# Patient Record
Sex: Female | Born: 1985 | Race: White | Hispanic: No | Marital: Married | Smoking: Never smoker
Health system: Southern US, Community
[De-identification: ages and names within clinical notes are randomized; demographics above are authoritative.]

## PROBLEM LIST (undated history)

## (undated) DIAGNOSIS — Z8711 Personal history of peptic ulcer disease: Secondary | ICD-10-CM

## (undated) DIAGNOSIS — E063 Autoimmune thyroiditis: Secondary | ICD-10-CM

## (undated) DIAGNOSIS — E119 Type 2 diabetes mellitus without complications: Secondary | ICD-10-CM

## (undated) DIAGNOSIS — K219 Gastro-esophageal reflux disease without esophagitis: Secondary | ICD-10-CM

## (undated) DIAGNOSIS — K7460 Unspecified cirrhosis of liver: Secondary | ICD-10-CM

## (undated) DIAGNOSIS — Z8719 Personal history of other diseases of the digestive system: Secondary | ICD-10-CM

## (undated) DIAGNOSIS — K759 Inflammatory liver disease, unspecified: Secondary | ICD-10-CM

## (undated) DIAGNOSIS — F1910 Other psychoactive substance abuse, uncomplicated: Secondary | ICD-10-CM

## (undated) DIAGNOSIS — F419 Anxiety disorder, unspecified: Secondary | ICD-10-CM

## (undated) DIAGNOSIS — E039 Hypothyroidism, unspecified: Secondary | ICD-10-CM

## (undated) DIAGNOSIS — F32A Depression, unspecified: Secondary | ICD-10-CM

## (undated) DIAGNOSIS — F329 Major depressive disorder, single episode, unspecified: Secondary | ICD-10-CM

## (undated) HISTORY — DX: Gastro-esophageal reflux disease without esophagitis: K21.9

## (undated) HISTORY — PX: LIVER TRANSPLANT: SHX410

## (undated) HISTORY — DX: Other psychoactive substance abuse, uncomplicated: F19.10

---

## 2015-07-28 ENCOUNTER — Encounter (HOSPITAL_COMMUNITY): Payer: Self-pay

## 2015-07-28 ENCOUNTER — Emergency Department (HOSPITAL_COMMUNITY)
Admission: EM | Admit: 2015-07-28 | Discharge: 2015-07-28 | Disposition: A | Discharge status: Home / Self Care | Payer: Managed Care, Other (non HMO) | Attending: Emergency Medicine | Admitting: Emergency Medicine

## 2015-07-28 DIAGNOSIS — R1110 Vomiting, unspecified: Secondary | ICD-10-CM | POA: Diagnosis not present

## 2015-07-28 DIAGNOSIS — R109 Unspecified abdominal pain: Secondary | ICD-10-CM | POA: Insufficient documentation

## 2015-07-28 DIAGNOSIS — R531 Weakness: Secondary | ICD-10-CM | POA: Diagnosis present

## 2015-07-28 DIAGNOSIS — M791 Myalgia: Secondary | ICD-10-CM | POA: Insufficient documentation

## 2015-07-28 HISTORY — DX: Major depressive disorder, single episode, unspecified: F32.9

## 2015-07-28 HISTORY — DX: Depression, unspecified: F32.A

## 2015-07-28 HISTORY — DX: Hypothyroidism, unspecified: E03.9

## 2015-07-28 HISTORY — DX: Personal history of other diseases of the digestive system: Z87.19

## 2015-07-28 HISTORY — DX: Personal history of peptic ulcer disease: Z87.11

## 2015-07-28 HISTORY — DX: Anxiety disorder, unspecified: F41.9

## 2015-07-28 HISTORY — DX: Autoimmune thyroiditis: E06.3

## 2015-07-28 LAB — COMPREHENSIVE METABOLIC PANEL
ALK PHOS: 89 U/L (ref 38–126)
ALT: 85 U/L — AB (ref 14–54)
AST: 98 U/L — AB (ref 15–41)
Albumin: 4.4 g/dL (ref 3.5–5.0)
Anion gap: 12 (ref 5–15)
BUN: 6 mg/dL (ref 6–20)
CALCIUM: 10 mg/dL (ref 8.9–10.3)
CHLORIDE: 98 mmol/L — AB (ref 101–111)
CO2: 26 mmol/L (ref 22–32)
CREATININE: 0.83 mg/dL (ref 0.44–1.00)
Glucose, Bld: 91 mg/dL (ref 65–99)
Potassium: 3.7 mmol/L (ref 3.5–5.1)
Sodium: 136 mmol/L (ref 135–145)
Total Bilirubin: 3.7 mg/dL — ABNORMAL HIGH (ref 0.3–1.2)
Total Protein: 8.3 g/dL — ABNORMAL HIGH (ref 6.5–8.1)

## 2015-07-28 LAB — CBC
HCT: 40.2 % (ref 36.0–46.0)
Hemoglobin: 14.3 g/dL (ref 12.0–15.0)
MCH: 37.2 pg — AB (ref 26.0–34.0)
MCHC: 35.6 g/dL (ref 30.0–36.0)
MCV: 104.7 fL — AB (ref 78.0–100.0)
PLATELETS: 160 10*3/uL (ref 150–400)
RBC: 3.84 MIL/uL — AB (ref 3.87–5.11)
RDW: 13.8 % (ref 11.5–15.5)
WBC: 10.2 10*3/uL (ref 4.0–10.5)

## 2015-07-28 LAB — URINALYSIS, ROUTINE W REFLEX MICROSCOPIC
Bilirubin Urine: NEGATIVE
GLUCOSE, UA: NEGATIVE mg/dL
KETONES UR: NEGATIVE mg/dL
Nitrite: NEGATIVE
PROTEIN: NEGATIVE mg/dL
Specific Gravity, Urine: 1.003 — ABNORMAL LOW (ref 1.005–1.030)
pH: 6.5 (ref 5.0–8.0)

## 2015-07-28 LAB — URINE MICROSCOPIC-ADD ON: RBC / HPF: NONE SEEN RBC/hpf (ref 0–5)

## 2015-07-28 LAB — I-STAT BETA HCG BLOOD, ED (MC, WL, AP ONLY)

## 2015-07-28 LAB — LIPASE, BLOOD: Lipase: 37 U/L (ref 11–51)

## 2015-07-28 MED ORDER — ONDANSETRON 4 MG PO TBDP
4.0000 mg | ORAL_TABLET | Freq: Once | ORAL | Status: AC | PRN
  Administered 2015-07-28: 4 mg via ORAL
  Filled 2015-07-28: qty 1

## 2015-07-28 NOTE — ED Notes (Signed)
No answer when pt's name called in the waiting room 

## 2015-07-28 NOTE — ED Notes (Signed)
Pt c/o weakness, emesis, and muscle pain x "a few months" and "orange" urine and "yellowed" sclera x "3-4 days", and L flank pain starting today.  Pain score 5/10.  Pt's mother is concerned that the Pt is in rabdo.

## 2015-07-28 NOTE — ED Notes (Signed)
No answer when pt's name called in the waiting room; unable to locate pt; pt eloped from waiting room

## 2015-11-14 ENCOUNTER — Encounter: Payer: Self-pay | Admitting: Gastroenterology

## 2015-11-23 ENCOUNTER — Telehealth: Payer: Self-pay | Admitting: Gastroenterology

## 2015-11-23 NOTE — Telephone Encounter (Signed)
Appointment scheduled with Alonza Bogus 11-24-15

## 2015-11-23 NOTE — Telephone Encounter (Signed)
No answer. Left a message on voicemail to call back. Can offer an appointment with an APP

## 2015-11-24 ENCOUNTER — Ambulatory Visit (INDEPENDENT_AMBULATORY_CARE_PROVIDER_SITE_OTHER): Payer: Managed Care, Other (non HMO) | Admitting: Gastroenterology

## 2015-11-24 ENCOUNTER — Encounter: Payer: Self-pay | Admitting: Gastroenterology

## 2015-11-24 VITALS — BP 118/72 | HR 84 | Ht 61.0 in | Wt 147.0 lb

## 2015-11-24 DIAGNOSIS — R197 Diarrhea, unspecified: Secondary | ICD-10-CM | POA: Diagnosis not present

## 2015-11-24 DIAGNOSIS — R1013 Epigastric pain: Secondary | ICD-10-CM | POA: Diagnosis not present

## 2015-11-24 DIAGNOSIS — F1010 Alcohol abuse, uncomplicated: Secondary | ICD-10-CM | POA: Diagnosis not present

## 2015-11-24 DIAGNOSIS — R630 Anorexia: Secondary | ICD-10-CM

## 2015-11-24 DIAGNOSIS — F1021 Alcohol dependence, in remission: Secondary | ICD-10-CM | POA: Insufficient documentation

## 2015-11-24 DIAGNOSIS — R110 Nausea: Secondary | ICD-10-CM | POA: Diagnosis not present

## 2015-11-24 DIAGNOSIS — Z87898 Personal history of other specified conditions: Secondary | ICD-10-CM | POA: Insufficient documentation

## 2015-11-24 MED ORDER — SUCRALFATE 1 G PO TABS: 1.0000 g | ORAL_TABLET | Freq: Three times a day (TID) | ORAL | Status: DC

## 2015-11-24 NOTE — Patient Instructions (Addendum)
You have been scheduled for an endoscopy and colonoscopy. Please follow the written instructions given to you at your visit today. Please pick up your prep supplies at the pharmacy within the next 1-3 days. If you use inhalers (even only as needed), please bring them with you on the day of your procedure. Your physician has requested that you go to www.startemmi.com and enter the access code given to you at your visit today. This web site gives a general overview about your procedure. However, you should still follow specific instructions given to you by our office regarding your preparation for the procedure.  We sent Carafate tablets to [facility].

## 2015-11-24 NOTE — Progress Notes (Signed)
11/24/2015 Patricia Lutz [id number] 02-23-1986   HISTORY OF PRESENT ILLNESS:  This is a 30 year old female who is new to our practice and was referred here by her PCP, Dr. Mariea Clonts, for evaluation regarding her GI issues. She is here today with HER-2 young children. She tells me that in July 2016 she had severe episodes of vomiting that caused her to be hospitalized. She had an endoscopy and was diagnosed with ulcers. 3 months later she had a repeat EGD and showed the majority of ulcers had healed. These were performed in [place] and we do not have the records at this time.  Now, she reports ongoing nausea with poor appetite and inability to eat. She says that she is to the point that she can even be around food or stand the smell of food without being extremely nauseous. She is on pantoprazole 40 mg daily and has not had even been increased to twice a day for some time but made no additional difference. She tells me that the only thing that she does is drink wine "for the calories".  She tells me that she has been extremely fatigued, sleeping up to 18 hours a day.  She had more extensive evaluation with lab studies by her PCP about 2 weeks ago so we are going to obtain the studies as well.  She also tells me that she has had constant diarrhea for the past 6 months.  Has multiple bowel movements a day. Says that her bottom is sore from having so many bowel movements and she does occasionally see some bright red blood that she thinks is from a "tear". She also tells me that she had a colonoscopy in her late teens/early 35s and was diagnosed with IBS at that time.  She does admit that previously when she had her ulcer issues she had been drinking a lot of alcohol. She apparently cut it out for some time but has been drinking "in moderation" more recently. When asked if she used any over-the-counter medications she said "I only mess my liver up with wine".  In March 2017 her total bilirubin was 2.05, ALT 66,  AST 84, and alkaline phosphatase 89.  INR was 1.06.  White blood cell count, hemoglobin, and platelets were within normal limits at that time.  She shows me pictures of a peeling rash that she had on her extremities, which is actually much improved today compared to the pictures.   Past Medical History  Diagnosis Date  . Anxiety   . History of stomach ulcers   . Hypothyroidism   . Depression   . Hashimoto's disease    Past Surgical History  Procedure Laterality Date  . Cesarean section      reports that she has never smoked. She does not have any smokeless tobacco history on file. She reports that she drinks alcohol. She reports that she does not use illicit drugs. family history includes Crohn's disease in her maternal grandmother and mother; Diabetes in her brother. No Known Allergies    Outpatient Encounter Prescriptions as of 11/24/2015  Medication Sig  . ALPRAZolam (XANAX) 0.5 MG tablet Take 0.5 mg by mouth at bedtime as needed for anxiety.  Marland Kitchen levothyroxine (SYNTHROID, LEVOTHROID) 25 MCG tablet Take 25 mcg by mouth daily before breakfast.  . pantoprazole (PROTONIX) 40 MG tablet Take 40 mg by mouth daily.  . sucralfate (CARAFATE) 1 g tablet Take 1 tablet (1 g total) by mouth 4 (four) times daily -  with meals and at bedtime.   No facility-administered encounter medications on file as of 11/24/2015.     REVIEW OF SYSTEMS  : All other systems reviewed and negative except where noted in the History of Present Illness.   PHYSICAL EXAM: BP 118/72 mmHg  Pulse 84  Ht 5' 1" (1.549 m)  Wt 147 lb (66.679 kg)  BMI 27.79 kg/m2 General: Well developed white female in no acute distress Head: Normocephalic and atraumatic Eyes:  Sclerae anicteric, conjunctiva pink. Ears: Normal auditory acuity Lungs: Clear throughout to auscultation Heart: Regular rate and rhythm Abdomen: Soft, non-distended.  Normal bowel sounds.  Epigastric and LUQ TTP. Rectal:  Will be done at the time of  colonoscopy. Musculoskeletal: Symmetrical with no gross deformities  Skin: No lesions on visible extremities.  Has some flaky appearance/dry skin on extremities that remains from her rash. Extremities: No edema  Neurological: Alert oriented x 4, grossly non-focal Psychological:  Alert and cooperative. Normal mood and affect  ASSESSMENT AND PLAN: -Epigastric abdominal pain with nausea, poor appetite in a patient with apparent history of ulcers:  We do not have any of her records from [place] at this time so we will be attempting to obtain those. Due to her ongoing symptoms I think that we likely need to repeat endoscopy. She will be scheduled for that. In the meantime she will continue on pantoprazole 40 mg daily (BID did not seem to help more than once a day), and I will add Carafate 4 times a day for now. -Diarrhea:  She tells me that she had a colonoscopy in her late teens/early 8s and was diagnosed with IBS. Reports constant diarrhea multiple times a day for the past 6 months. We will just repeat colonoscopy at this time as well to rule out IBD, etc. -ETOH abuse:  Appears to have some elevated LFT's on labs from 07/2015.  She's had more recent labs performed and we will be obtaining those from her PCP.   -Depression and anxiety:  Likely contributing to her GI issues. -Rash:  She is going to continue to monitor it to see if it completely resolves, but otherwise I recommended seeing a dermatologist.  *The risks, benefits, and alternatives to EGD and colonoscopy were discussed with the patient and she consents to proceed.   CC:  No ref. provider found

## 2015-11-25 ENCOUNTER — Telehealth: Payer: Self-pay | Admitting: *Deleted

## 2015-11-25 ENCOUNTER — Encounter: Payer: Self-pay | Admitting: Internal Medicine

## 2015-11-25 NOTE — Telephone Encounter (Signed)
Received faxed records we requested from [facility] on this patient.  Stamped and sent to be scanned today, 11-25-2015.

## 2015-12-05 ENCOUNTER — Encounter: Payer: Self-pay | Admitting: Internal Medicine

## 2015-12-05 ENCOUNTER — Ambulatory Visit (AMBULATORY_SURGERY_CENTER): Payer: Managed Care, Other (non HMO) | Admitting: Internal Medicine

## 2015-12-05 VITALS — BP 116/82 | HR 80 | Temp 97.8°F | Resp 12 | Ht 61.0 in | Wt 147.0 lb

## 2015-12-05 DIAGNOSIS — R197 Diarrhea, unspecified: Secondary | ICD-10-CM

## 2015-12-05 DIAGNOSIS — R1013 Epigastric pain: Secondary | ICD-10-CM | POA: Diagnosis not present

## 2015-12-05 MED ORDER — SODIUM CHLORIDE 0.9 % IV SOLN: 500.0000 mL | INTRAVENOUS | Status: DC

## 2015-12-05 NOTE — Op Note (Signed)
[place] Patient Name: Patricia Lutz Procedure Date: 12/05/2015 9:22 AM MRN: [id number] Endoscopist: Gatha Mayer , MD Age: 30 Referring MD:  Date of Birth: 07-21-1985 Gender: Female Account #: [id number] Procedure:                Colonoscopy Indications:              Clinically significant diarrhea of unexplained                            origin Medicines:                Propofol per Anesthesia, Monitored Anesthesia Care Procedure:                Pre-Anesthesia Assessment:                           - Prior to the procedure, a History and Physical                            was performed, and patient medications and                            allergies were reviewed. The patient's tolerance of                            previous anesthesia was also reviewed. The risks                            and benefits of the procedure and the sedation                            options and risks were discussed with the patient.                            All questions were answered, and informed consent                            was obtained. Prior Anticoagulants: The patient has                            taken no previous anticoagulant or antiplatelet                            agents. ASA Grade Assessment: II - A patient with                            mild systemic disease. After reviewing the risks                            and benefits, the patient was deemed in                            satisfactory condition to undergo the procedure.                           -  Prior to the procedure, a History and Physical                            was performed, and patient medications and                            allergies were reviewed. The patient's tolerance of                            previous anesthesia was also reviewed. The risks                            and benefits of the procedure and the sedation                            options and risks were discussed with the  patient.                            All questions were answered, and informed consent                            was obtained. Prior Anticoagulants: The patient has                            taken no previous anticoagulant or antiplatelet                            agents. ASA Grade Assessment: II - A patient with                            mild systemic disease. After reviewing the risks                            and benefits, the patient was deemed in                            satisfactory condition to undergo the procedure.                           After obtaining informed consent, the colonoscope                            was passed under direct vision. Throughout the                            procedure, the patient's blood pressure, pulse, and                            oxygen saturations were monitored continuously. The                            Model CF-HQ190L [id number]) scope was introduced  through the anus and advanced to the the cecum,                            identified by appendiceal orifice and ileocecal                            valve. The colonoscopy was performed without                            difficulty. The patient tolerated the procedure                            well. The quality of the bowel preparation was                            good. The bowel preparation used was Miralax. The                            terminal ileum, ileocecal valve, appendiceal                            orifice, and rectum were photographed. Scope In: 9:39:11 AM Scope Out: 9:47:37 AM Total Procedure Duration: 0 hours 8 minutes 26 seconds  Findings:                 The perianal and digital rectal examinations were                            normal.                           The terminal ileum appeared normal.                           The entire examined colon appeared normal on direct                            and retroflexion views.                            Biopsies for histology were taken with a cold                            forceps from the rectum and entire colon for                            evaluation of microscopic colitis. Estimated blood                            loss was minimal. Complications:            No immediate complications. Estimated Blood Loss:     Estimated blood loss was minimal. Impression:               - The examined portion of the ileum was normal.                           -  The entire examined colon is normal on direct and                            retroflexion views.                           - Biopsies were taken with a cold forceps from the                            rectum and entire colon for evaluation of                            microscopic colitis. Recommendation:           - Patient has a contact number available for                            emergencies. The signs and symptoms of potential                            delayed complications were discussed with the                            patient. Return to normal activities tomorrow.                            Written discharge instructions were provided to the                            patient.                           - Resume previous diet.                           - Continue present medications.                           - Await pathology results.                           - Repeat colonoscopy is recommended. The                            colonoscopy date will be determined after pathology                            results from today's exam become available for                            review. Gatha Mayer, MD 12/05/2015 10:00:18 AM This report has been signed electronically.

## 2015-12-05 NOTE — Progress Notes (Signed)
Report to PACU, RN, vss, BBS= Clear.  

## 2015-12-05 NOTE — Progress Notes (Signed)
Called to room to assist during endoscopic procedure.  Patient ID and intended procedure confirmed with present staff. Received instructions for my participation in the procedure from the performing physician.  

## 2015-12-05 NOTE — Patient Instructions (Addendum)
Things look normal but I took small intestine and large intestine biopsies to try to find out what is going on.  I will call w/ results and recommendations by next week.  You may use Imodium AD to help diarrhea.  I appreciate the opportunity to care for you. Gatha Mayer, MD, FACG  YOU HAD AN ENDOSCOPIC PROCEDURE TODAY AT [facility]:   Refer to the procedure report that was given to you for any specific questions about what was found during the examination.  If the procedure report does not answer your questions, please call your gastroenterologist to clarify.  If you requested that your care partner not be given the details of your procedure findings, then the procedure report has been included in a sealed envelope for you to review at your convenience later.  YOU SHOULD EXPECT: Some feelings of bloating in the abdomen. Passage of more gas than usual.  Walking can help get rid of the air that was put into your GI tract during the procedure and reduce the bloating. If you had a lower endoscopy (such as a colonoscopy or flexible sigmoidoscopy) you may notice spotting of blood in your stool or on the toilet paper. If you underwent a bowel prep for your procedure, you may not have a normal bowel movement for a few days.  Please Note:  You might notice some irritation and congestion in your nose or some drainage.  This is from the oxygen used during your procedure.  There is no need for concern and it should clear up in a day or so.  SYMPTOMS TO REPORT IMMEDIATELY:   Following lower endoscopy (colonoscopy or flexible sigmoidoscopy):  Excessive amounts of blood in the stool  Significant tenderness or worsening of abdominal pains  Swelling of the abdomen that is new, acute  Fever of 100F or higher   Following upper endoscopy (EGD)  Vomiting of blood or coffee ground material  New chest pain or pain under the shoulder blades  Painful or persistently difficult  swallowing  New shortness of breath  Fever of 100F or higher  Black, tarry-looking stools  For urgent or emergent issues, a gastroenterologist can be reached at any hour by calling [phone].   DIET: Your first meal following the procedure should be a small meal and then it is ok to progress to your normal diet. Heavy or fried foods are harder to digest and may make you feel nauseous or bloated.  Likewise, meals heavy in dairy and vegetables can increase bloating.  Drink plenty of fluids but you should avoid alcoholic beverages for 24 hours.  ACTIVITY:  You should plan to take it easy for the rest of today and you should NOT DRIVE or use heavy machinery until tomorrow (because of the sedation medicines used during the test).    FOLLOW UP: Our staff will call the number listed on your records the next business day following your procedure to check on you and address any questions or concerns that you may have regarding the information given to you following your procedure. If we do not reach you, we will leave a message.  However, if you are feeling well and you are not experiencing any problems, there is no need to return our call.  We will assume that you have returned to your regular daily activities without incident.  If any biopsies were taken you will be contacted by phone or by letter within the next 1-3 weeks.  Please  call us at [phone] if you have not heard about the biopsies in 3 weeks.    SIGNATURES/CONFIDENTIALITY: You and/or your care partner have signed paperwork which will be entered into your electronic medical record.  These signatures attest to the fact that that the information above on your After Visit Summary has been reviewed and is understood.  Full responsibility of the confidentiality of this discharge information lies with you and/or your care-partner.

## 2015-12-05 NOTE — Progress Notes (Signed)
Agree with Ms. Zehr's management.  Carl E. Gessner, MD, FACG  

## 2015-12-05 NOTE — Op Note (Signed)
[place] Patient Name: Patricia Lutz Procedure Date: 12/05/2015 9:21 AM MRN: [id number] Endoscopist: Gatha Mayer , MD Age: 30 Referring MD:  Date of Birth: 04/15/1986 Gender: Female Account #: [id number] Procedure:                Upper GI endoscopy Indications:              Epigastric abdominal pain, Diarrhea Medicines:                Monitored Anesthesia Care, Propofol per Anesthesia Procedure:                Pre-Anesthesia Assessment:                           - Prior to the procedure, a History and Physical                            was performed, and patient medications and                            allergies were reviewed. The patient's tolerance of                            previous anesthesia was also reviewed. The risks                            and benefits of the procedure and the sedation                            options and risks were discussed with the patient.                            All questions were answered, and informed consent                            was obtained. Prior Anticoagulants: The patient has                            taken no previous anticoagulant or antiplatelet                            agents. ASA Grade Assessment: II - A patient with                            mild systemic disease. After reviewing the risks                            and benefits, the patient was deemed in                            satisfactory condition to undergo the procedure.                           After obtaining informed consent, the endoscope was  passed under direct vision. Throughout the                            procedure, the patient's blood pressure, pulse, and                            oxygen saturations were monitored continuously. The                            Model GIF-HQ190 [id number]) scope was introduced                            through the mouth, and advanced to the second part      of duodenum. The upper GI endoscopy was                            accomplished without difficulty. The patient                            tolerated the procedure well. Scope In: 9:32:12 AM Scope Out: 9:37:10 AM Total Procedure Duration: 0 hours 4 minutes 58 seconds  Findings:                 The esophagus was normal.                           The stomach was normal.                           The examined duodenum was normal.                           Biopsies for histology were taken with a cold                            forceps in the duodenal bulb and in the second                            portion of the duodenum for evaluation of celiac                            disease. Verification of patient identification for                            the specimen was done. Estimated blood loss was                            minimal.                           The cardia and gastric fundus were normal on                            retroflexion. Complications:            No immediate complications. Estimated Blood Loss:  Estimated blood loss was minimal. Impression:               - Normal esophagus.                           - Normal stomach.                           - Normal examined duodenum.                           - Biopsies were taken with a cold forceps for                            evaluation of celiac disease. Recommendation:           - Patient has a contact number available for                            emergencies. The signs and symptoms of potential                            delayed complications were discussed with the                            patient. Return to normal activities tomorrow.                            Written discharge instructions were provided to the                            patient.                           - See the other procedure note for documentation of                            additional recommendations.                           -  Await pathology results.                           - Resume previous diet.                           - Continue present medications. Gatha Mayer, MD 12/05/2015 9:57:57 AM This report has been signed electronically.

## 2015-12-06 ENCOUNTER — Telehealth: Payer: Self-pay

## 2015-12-06 NOTE — Telephone Encounter (Signed)
  Follow up Call-  Call back number 12/05/2015  Post procedure Call Back phone  # [phone]  Permission to leave phone message Yes    Patient was called for follow up after her procedure on 12/05/2015. No answer at the number given for follow up phone call. A message was left on the answering machine.

## 2015-12-09 NOTE — Progress Notes (Signed)
Quick Note:  Biopsies of small and large intestine are NL Needs   1) LFT 2) abdominal US to evaluate epigastric pain and RUQ pain, nausea and vomiting + abnl LFT's 3) stop all EtOH ______

## 2015-12-12 ENCOUNTER — Other Ambulatory Visit: Payer: Self-pay

## 2015-12-12 DIAGNOSIS — R112 Nausea with vomiting, unspecified: Secondary | ICD-10-CM

## 2015-12-12 DIAGNOSIS — R1013 Epigastric pain: Secondary | ICD-10-CM

## 2015-12-12 DIAGNOSIS — R945 Abnormal results of liver function studies: Secondary | ICD-10-CM

## 2015-12-12 DIAGNOSIS — R7989 Other specified abnormal findings of blood chemistry: Secondary | ICD-10-CM

## 2015-12-12 DIAGNOSIS — R1011 Right upper quadrant pain: Secondary | ICD-10-CM

## 2015-12-12 NOTE — Addendum Note (Signed)
Addended by: Marlon Pel on: 12/12/2015 04:23 PM   Modules accepted: Orders

## 2015-12-19 ENCOUNTER — Ambulatory Visit (HOSPITAL_COMMUNITY)
Admission: RE | Admit: 2015-12-19 | Discharge: 2015-12-19 | Disposition: A | Discharge status: Home / Self Care | Payer: Managed Care, Other (non HMO) | Source: Ambulatory Visit | Attending: Internal Medicine | Admitting: Internal Medicine

## 2015-12-19 DIAGNOSIS — R112 Nausea with vomiting, unspecified: Secondary | ICD-10-CM

## 2015-12-19 DIAGNOSIS — R1013 Epigastric pain: Secondary | ICD-10-CM

## 2015-12-19 DIAGNOSIS — R7989 Other specified abnormal findings of blood chemistry: Secondary | ICD-10-CM | POA: Insufficient documentation

## 2015-12-19 DIAGNOSIS — K760 Fatty (change of) liver, not elsewhere classified: Secondary | ICD-10-CM | POA: Diagnosis not present

## 2015-12-19 DIAGNOSIS — R1011 Right upper quadrant pain: Secondary | ICD-10-CM | POA: Insufficient documentation

## 2015-12-19 DIAGNOSIS — R945 Abnormal results of liver function studies: Secondary | ICD-10-CM

## 2015-12-20 ENCOUNTER — Other Ambulatory Visit: Payer: Self-pay

## 2015-12-20 ENCOUNTER — Ambulatory Visit (HOSPITAL_COMMUNITY): Payer: Managed Care, Other (non HMO)

## 2015-12-20 ENCOUNTER — Telehealth: Payer: Self-pay | Admitting: Internal Medicine

## 2015-12-20 MED ORDER — ONDANSETRON HCL 4 MG PO TABS: 4.0000 mg | ORAL_TABLET | Freq: Four times a day (QID) | ORAL | 0 refills | Status: DC | PRN

## 2015-12-20 MED ORDER — ONDANSETRON HCL 4 MG PO TABS: 4.0000 mg | ORAL_TABLET | ORAL | 0 refills | Status: DC

## 2015-12-20 NOTE — Telephone Encounter (Signed)
New rx sent.  Previous rx sent as q 6 weeks not q 6 hours

## 2015-12-20 NOTE — Progress Notes (Signed)
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 Probably Janett Billow We can Rx ondansetron 4 mg q 6 prn # 60 no refill  If she stops EtOH and is still having the problems we can do a CCK HIDA - she can let us know how it goes

## 2016-01-17 ENCOUNTER — Ambulatory Visit: Payer: Managed Care, Other (non HMO) | Admitting: Gastroenterology

## 2016-01-30 ENCOUNTER — Ambulatory Visit: Payer: Managed Care, Other (non HMO) | Admitting: Internal Medicine

## 2016-01-31 ENCOUNTER — Telehealth: Payer: Self-pay

## 2016-01-31 NOTE — Telephone Encounter (Signed)
Left Joleah a message to please call us and r/s her missed 01/30/16 appointment with Dr Carlean Purl.  Also I'm mailing her a letter regarding this matter.

## 2016-05-23 ENCOUNTER — Emergency Department (HOSPITAL_COMMUNITY): Payer: Managed Care, Other (non HMO)

## 2016-05-23 ENCOUNTER — Encounter (HOSPITAL_COMMUNITY): Payer: Self-pay | Admitting: Emergency Medicine

## 2016-05-23 ENCOUNTER — Emergency Department (HOSPITAL_COMMUNITY)
Admission: EM | Admit: 2016-05-23 | Discharge: 2016-05-24 | Disposition: A | Discharge status: Home / Self Care | Payer: Managed Care, Other (non HMO) | Attending: Emergency Medicine | Admitting: Emergency Medicine

## 2016-05-23 DIAGNOSIS — R7989 Other specified abnormal findings of blood chemistry: Secondary | ICD-10-CM | POA: Diagnosis not present

## 2016-05-23 DIAGNOSIS — F1010 Alcohol abuse, uncomplicated: Secondary | ICD-10-CM | POA: Diagnosis not present

## 2016-05-23 DIAGNOSIS — R112 Nausea with vomiting, unspecified: Secondary | ICD-10-CM | POA: Diagnosis present

## 2016-05-23 DIAGNOSIS — K828 Other specified diseases of gallbladder: Secondary | ICD-10-CM | POA: Insufficient documentation

## 2016-05-23 DIAGNOSIS — E039 Hypothyroidism, unspecified: Secondary | ICD-10-CM | POA: Insufficient documentation

## 2016-05-23 DIAGNOSIS — Z79899 Other long term (current) drug therapy: Secondary | ICD-10-CM | POA: Diagnosis not present

## 2016-05-23 DIAGNOSIS — R1010 Upper abdominal pain, unspecified: Secondary | ICD-10-CM | POA: Diagnosis not present

## 2016-05-23 DIAGNOSIS — R945 Abnormal results of liver function studies: Secondary | ICD-10-CM

## 2016-05-23 LAB — COMPREHENSIVE METABOLIC PANEL
ALBUMIN: 4.6 g/dL (ref 3.5–5.0)
ALT: 90 U/L — ABNORMAL HIGH (ref 14–54)
AST: 467 U/L — ABNORMAL HIGH (ref 15–41)
Alkaline Phosphatase: 197 U/L — ABNORMAL HIGH (ref 38–126)
Anion gap: 15 (ref 5–15)
CHLORIDE: 92 mmol/L — AB (ref 101–111)
CO2: 25 mmol/L (ref 22–32)
Calcium: 9.7 mg/dL (ref 8.9–10.3)
Creatinine, Ser: 0.63 mg/dL (ref 0.44–1.00)
GFR calc Af Amer: >60 mL/min (ref >60)
GFR calc non Af Amer: >60 mL/min (ref >60)
GLUCOSE: 126 mg/dL — AB (ref 65–99)
POTASSIUM: 4 mmol/L (ref 3.5–5.1)
Sodium: 132 mmol/L — ABNORMAL LOW (ref 135–145)
Total Bilirubin: 4.3 mg/dL — ABNORMAL HIGH (ref 0.3–1.2)
Total Protein: 8.5 g/dL — ABNORMAL HIGH (ref 6.5–8.1)

## 2016-05-23 LAB — URINALYSIS, ROUTINE W REFLEX MICROSCOPIC
BILIRUBIN URINE: NEGATIVE
GLUCOSE, UA: 50 mg/dL — AB
KETONES UR: 5 mg/dL — AB
Nitrite: NEGATIVE
Protein, ur: 100 mg/dL — AB
Specific Gravity, Urine: 1.027 (ref 1.005–1.030)
pH: 5 (ref 5.0–8.0)

## 2016-05-23 LAB — CBC
HEMATOCRIT: 41.7 % (ref 36.0–46.0)
HEMOGLOBIN: 14.6 g/dL (ref 12.0–15.0)
MCH: 34.3 pg — ABNORMAL HIGH (ref 26.0–34.0)
MCHC: 35 g/dL (ref 30.0–36.0)
MCV: 97.9 fL (ref 78.0–100.0)
Platelets: 150 10*3/uL (ref 150–400)
RBC: 4.26 MIL/uL (ref 3.87–5.11)
RDW: 14.8 % (ref 11.5–15.5)
WBC: 9.9 10*3/uL (ref 4.0–10.5)

## 2016-05-23 LAB — I-STAT BETA HCG BLOOD, ED (MC, WL, AP ONLY): I-stat hCG, quantitative: <5 m[IU]/mL (ref <5)

## 2016-05-23 LAB — LIPASE, BLOOD: LIPASE: 39 U/L (ref 11–51)

## 2016-05-23 MED ORDER — SODIUM CHLORIDE 0.9 % IV SOLN
1000.0000 mL | Freq: Once | INTRAVENOUS | Status: AC
  Administered 2016-05-23: 1000 mL via INTRAVENOUS

## 2016-05-23 MED ORDER — ONDANSETRON HCL 4 MG/2ML IJ SOLN
4.0000 mg | Freq: Once | INTRAMUSCULAR | Status: AC
  Administered 2016-05-23: 4 mg via INTRAVENOUS
  Filled 2016-05-23: qty 2

## 2016-05-23 MED ORDER — HYDROMORPHONE HCL 1 MG/ML IJ SOLN
1.0000 mg | Freq: Once | INTRAMUSCULAR | Status: AC
  Administered 2016-05-23: 1 mg via INTRAVENOUS
  Filled 2016-05-23: qty 1

## 2016-05-23 MED ORDER — IOPAMIDOL (ISOVUE-300) INJECTION 61%
100.0000 mL | Freq: Once | INTRAVENOUS | Status: AC | PRN
  Administered 2016-05-23: 100 mL via INTRAVENOUS

## 2016-05-23 MED ORDER — SODIUM CHLORIDE 0.9 % IV SOLN
1000.0000 mL | INTRAVENOUS | Status: DC
  Administered 2016-05-24: 1000 mL via INTRAVENOUS

## 2016-05-23 MED ORDER — METOCLOPRAMIDE HCL 5 MG/ML IJ SOLN
10.0000 mg | Freq: Once | INTRAMUSCULAR | Status: AC
  Administered 2016-05-23: 10 mg via INTRAVENOUS
  Filled 2016-05-23: qty 2

## 2016-05-23 MED ORDER — IOPAMIDOL (ISOVUE-300) INJECTION 61%
INTRAVENOUS | Status: AC
  Filled 2016-05-23: qty 100

## 2016-05-23 MED ORDER — ONDANSETRON 4 MG PO TBDP
4.0000 mg | ORAL_TABLET | Freq: Once | ORAL | Status: AC | PRN
  Administered 2016-05-23: 4 mg via ORAL
  Filled 2016-05-23: qty 1

## 2016-05-23 MED ORDER — DIPHENHYDRAMINE HCL 50 MG/ML IJ SOLN
25.0000 mg | Freq: Once | INTRAMUSCULAR | Status: AC
  Administered 2016-05-24: 25 mg via INTRAVENOUS
  Filled 2016-05-23: qty 1

## 2016-05-23 NOTE — ED Triage Notes (Signed)
Patient c/o abdominal pain, emesis, and diarrhea x2 weeks. Hx stomach ulcers. Reports one episode of vomiting with bright red blood and two episodes of diarrhea in the past  24 hours.

## 2016-05-24 MED ORDER — CHLORDIAZEPOXIDE HCL 25 MG PO CAPS: ORAL_CAPSULE | ORAL | 0 refills | Status: DC

## 2016-05-24 MED ORDER — PROMETHAZINE HCL 25 MG RE SUPP: 25.0000 mg | Freq: Four times a day (QID) | RECTAL | 0 refills | Status: DC | PRN

## 2016-05-24 MED ORDER — PANTOPRAZOLE SODIUM 40 MG PO TBEC: 40.0000 mg | DELAYED_RELEASE_TABLET | Freq: Two times a day (BID) | ORAL | 0 refills | Status: DC

## 2016-05-24 MED ORDER — ONDANSETRON 8 MG PO TBDP: 8.0000 mg | ORAL_TABLET | Freq: Three times a day (TID) | ORAL | 0 refills | Status: DC | PRN

## 2016-05-24 MED ORDER — METOCLOPRAMIDE HCL 10 MG PO TABS: 10.0000 mg | ORAL_TABLET | Freq: Four times a day (QID) | ORAL | 0 refills | Status: DC | PRN

## 2016-05-24 NOTE — ED Provider Notes (Signed)
[place] DEPT Provider Note   CSN: [id number] Arrival date & time: 05/23/16  8546     History   Chief Complaint Chief Complaint  Patient presents with  . Abdominal Pain    HPI Patricia Lutz is a 31 y.o. female.  HPI Patient ports nausea vomiting over the past 2 weeks and decreased oral intake.  She reports upper abdominal discomfort.  She has a history of gastric ulcers diagnosed in [place].  She also had a repeat endoscopy in [place] followed by repeat endoscopy in July 2017 by [facility] gastroenterology which demonstrated no ulcer or gastritis.  She reports some streaking of blood in in her vomit.  No fevers or chills.  She reports decreased oral intake.  She states she feels weak.  She continues to drink alcohol daily although she reports that she has significantly cut back.  She is still drinking 4-5 glasses of wine daily.  She states that she's done this for some period time and helps with her anxiety.  She is also on Remeron to help her with sleep and is on BuSpar to help her with anxiety.  She has not taken her Protonix in the past several days as she has run out of her Protonix.  She did not have a nausea medication at home.  Some diarrhea.  No other complaints.  No prior history of gallstones.   Past Medical History:  Diagnosis Date  . Anxiety   . Depression   . GERD (gastroesophageal reflux disease)   . Hashimoto's disease   . History of stomach ulcers   . Hypothyroidism   . Substance abuse    alcohol    Patient Active Problem List   Diagnosis Date Noted  . Alcohol abuse 11/24/2015  . Nausea without vomiting 11/24/2015  . Abdominal pain, epigastric 11/24/2015  . Diarrhea 11/24/2015  . Poor appetite 11/24/2015  . History of ulcer disease 11/24/2015    Past Surgical History:  Procedure Laterality Date  . CESAREAN SECTION      OB History    No data available       Home Medications    Prior to Admission medications   Medication Sig Start Date End  Date Taking? Authorizing Provider  ALPRAZolam Duanne Moron) 0.5 MG tablet Take 0.5 mg by mouth at bedtime as needed for anxiety.   Yes Historical Provider, MD  busPIRone (BUSPAR) 15 MG tablet TAKE 1 TABLET BY MOUTH 3 TIMES A DAY 05/22/16  Yes Historical Provider, MD  levothyroxine (SYNTHROID, LEVOTHROID) 25 MCG tablet Take 25 mcg by mouth daily before breakfast.   Yes Historical Provider, MD  mirtazapine (REMERON) 15 MG tablet Take 15 mg by mouth at bedtime.  03/19/16  Yes Historical Provider, MD  ondansetron (ZOFRAN) 4 MG tablet Take 1 tablet (4 mg total) by mouth every 6 (six) hours as needed for nausea or vomiting. 12/20/15  Yes Gatha Mayer, MD  chlordiazePOXIDE (LIBRIUM) 25 MG capsule 50mg  PO TID x 1D, then 25-50mg  PO BID X 1D, then 25-50mg  PO QD X 1D 05/24/16   Jola Schmidt, MD  metoCLOPramide (REGLAN) 10 MG tablet Take 1 tablet (10 mg total) by mouth every 6 (six) hours as needed for nausea or vomiting. 05/24/16   Jola Schmidt, MD  ondansetron (ZOFRAN ODT) 8 MG disintegrating tablet Take 1 tablet (8 mg total) by mouth every 8 (eight) hours as needed for nausea or vomiting. 05/24/16   Jola Schmidt, MD  pantoprazole (PROTONIX) 40 MG tablet Take 1 tablet (40  mg total) by mouth 2 (two) times daily. 05/24/16   Jola Schmidt, MD  promethazine (PHENERGAN) 25 MG suppository Place 1 suppository (25 mg total) rectally every 6 (six) hours as needed for nausea or vomiting. 05/24/16   Jola Schmidt, MD  sucralfate (CARAFATE) 1 g tablet Take 1 tablet (1 g total) by mouth 4 (four) times daily -  with meals and at bedtime. Patient not taking: Reported on 05/23/2016 11/24/15   Laban Emperor Zehr, PA-C    Family History Family History  Problem Relation Age of Onset  . Crohn's disease Mother   . Crohn's disease Maternal Grandmother   . Diabetes Brother     Social History Social History  Substance Use Topics  . Smoking status: Never Smoker  . Smokeless tobacco: Not on file  . Alcohol use 21.0 oz/week    35 Glasses of wine per  week     Allergies   Patient has no known allergies.   Review of Systems Review of Systems  All other systems reviewed and are negative.    Physical Exam Updated Vital Signs BP 136/100 (BP Location: Left Arm)   Pulse 110   Temp 100.1 F (37.8 C) (Oral)   Resp 18   Ht 5\' 2"  (1.575 m)   Wt 165 lb (74.8 kg)   LMP 05/23/2016 Comment: HCG neg 05/24/15  SpO2 97%   BMI 30.18 kg/m   Physical Exam  Constitutional: She is oriented to person, place, and time. She appears well-developed and well-nourished. No distress.  HENT:  Head: Normocephalic and atraumatic.  Eyes: EOM are normal. No scleral icterus.  Neck: Normal range of motion.  Cardiovascular: Normal rate, regular rhythm and normal heart sounds.   Pulmonary/Chest: Effort normal and breath sounds normal.  Abdominal: Soft. She exhibits no distension. There is no tenderness.  Musculoskeletal: Normal range of motion.  Neurological: She is alert and oriented to person, place, and time.  Skin: Skin is warm and dry.  Psychiatric: She has a normal mood and affect. Judgment normal.  Nursing note and vitals reviewed.    ED Treatments / Results  Labs (all labs ordered are listed, but only abnormal results are displayed) Labs Reviewed  COMPREHENSIVE METABOLIC PANEL - Abnormal; Notable for the following:       Result Value   Sodium 132 (*)    Chloride 92 (*)    Glucose, Bld 126 (*)    BUN <5 (*)    Total Protein 8.5 (*)    AST 467 (*)    ALT 90 (*)    Alkaline Phosphatase 197 (*)    Total Bilirubin 4.3 (*)    All other components within normal limits  CBC - Abnormal; Notable for the following:    MCH 34.3 (*)    All other components within normal limits  URINALYSIS, ROUTINE W REFLEX MICROSCOPIC - Abnormal; Notable for the following:    Color, Urine RED (*)    APPearance CLOUDY (*)    Glucose, UA 50 (*)    Hgb urine dipstick LARGE (*)    Ketones, ur 5 (*)    Protein, ur 100 (*)    Leukocytes, UA TRACE (*)     Bacteria, UA FEW (*)    Squamous Epithelial / LPF TOO NUMEROUS TO COUNT (*)    All other components within normal limits  LIPASE, BLOOD  HEPATITIS PANEL, ACUTE  I-STAT BETA HCG BLOOD, ED ([facility], [facility], [facility] ONLY)   ALT  Date Value Ref Range Status  05/23/2016 90 (H) 14 - 54 U/L Final  07/28/2015 85 (H) 14 - 54 U/L Final    AST  Date Value Ref Range Status  05/23/2016 467 (H) 15 - 41 U/L Final  07/28/2015 98 (H) 15 - 41 U/L Final     EKG  EKG Interpretation None       Radiology Ct Abdomen Pelvis W Contrast  Result Date: 05/23/2016 CLINICAL DATA:  Abdominal pain and diarrhea for 2 weeks. EXAM: CT ABDOMEN AND PELVIS WITH CONTRAST TECHNIQUE: Multidetector CT imaging of the abdomen and pelvis was performed using the standard protocol following bolus administration of intravenous contrast. CONTRAST:  192mL ISOVUE-300 IOPAMIDOL (ISOVUE-300) INJECTION 61% COMPARISON:  None. FINDINGS: Lower chest: No acute abnormality. Hepatobiliary: Diffuse fatty infiltration of the liver without focal lesion. The gallbladder and bile ducts appear unremarkable. Pancreas: Unremarkable. No pancreatic ductal dilatation or surrounding inflammatory changes. Spleen: Normal in size without focal abnormality. Adrenals/Urinary Tract: Adrenal glands are unremarkable. Kidneys are normal, without renal calculi, focal lesion, or hydronephrosis. Bladder is unremarkable. Stomach/Bowel: Stomach is within normal limits. Appendix appears normal. No evidence of bowel wall thickening, distention, or inflammatory changes. Vascular/Lymphatic: No significant vascular findings are present. No enlarged abdominal or pelvic lymph nodes. Reproductive: Uterus and bilateral adnexa are unremarkable. IUD noted Other: No acute inflammation.  No ascites. Musculoskeletal: No significant skeletal lesion. Small fat containing umbilical hernia. IMPRESSION: Hepatic steatosis. Electronically Signed   By: Andreas Newport M.D.   On: 05/23/2016 22:43   US  Abdomen Limited Ruq  Result Date: 05/23/2016 CLINICAL DATA:  Upper abdominal pain EXAM: US ABDOMEN LIMITED - RIGHT UPPER QUADRANT COMPARISON:  CT 05/23/2016, ultrasound 12/19/2015 FINDINGS: Gallbladder: Minimal sludge present within the gallbladder lumen. Normal wall thickness. No surrounding fluid. Negative sonographic Murphy's. Common bile duct: Diameter: Normal at 2.2 mm Liver: Increased hepatic echogenicity consistent with fatty infiltration. No focal hepatic abnormality. No ascites. IMPRESSION: 1. Minimal sludge within the gallbladder. Gallbladder is otherwise normal. No biliary dilatation. 2. Hepatic steatosis Electronically Signed   By: Donavan Foil M.D.   On: 05/23/2016 23:51    Procedures Procedures (including critical care time)  Medications Ordered in ED Medications  0.9 %  sodium chloride infusion (0 mLs Intravenous Stopped 05/24/16 0000)    Followed by  0.9 %  sodium chloride infusion (0 mLs Intravenous Stopped 05/24/16 0000)    Followed by  0.9 %  sodium chloride infusion (1,000 mLs Intravenous New Bag/Given 05/24/16 0006)  iopamidol (ISOVUE-300) 61 % injection (not administered)  ondansetron (ZOFRAN-ODT) disintegrating tablet 4 mg (4 mg Oral Given 05/23/16 1833)  ondansetron (ZOFRAN) injection 4 mg (4 mg Intravenous Given 05/23/16 2125)  iopamidol (ISOVUE-300) 61 % injection 100 mL (100 mLs Intravenous Contrast Given 05/23/16 2217)  HYDROmorphone (DILAUDID) injection 1 mg (1 mg Intravenous Given 05/23/16 2238)  metoCLOPramide (REGLAN) injection 10 mg (10 mg Intravenous Given 05/23/16 2238)  diphenhydrAMINE (BENADRYL) injection 25 mg (25 mg Intravenous Given 05/24/16 0006)     Initial Impression / Assessment and Plan / ED Course  I have reviewed the triage vital signs and the nursing notes.  Pertinent labs & imaging results that were available during my care of the patient were reviewed by me and considered in my medical decision making (see chart for details).  Clinical Course     12:57  AM Patient feels much better at this time.  She does have mild gallbladder sludge without surrounding signs to suggest cholecystitis.  No dilation of her common bile duct.  She  continues to drink alcohol and we a long discussion regarding her need to discontinue alcohol as I'm concerned about her degree of liver enzymes.  They've been elevated in the past but they do appear to be rising.  She does have evidence of hepatic steatosis on ultrasound.  She'll need to follow back up with gastroenterology.  I do not suspect that this acute gallbladder issue but if her symptoms persist she may benefit from a HIDA scan.  She is using alcohol to self treat anxiety.  Am concerned because both Remeron and BuSpar but cleared by the liver.  I told her that she needs to decrease the insults to her liver and she is a stop drinking alcohol.  She'll try to wean herself off alcohol by herself but if this does not work I provided her Librium prescription.  Acute hepatitis panel sent.  I spent nearly 40 minutes speaking with the patient and the patient's spouse regarding her findings tonight as well as the need for outpatient GI follow-up and ongoing management of her elevated liver function tests.  She understands return to the ER for new or worsening symptoms.  I do not think she needs additional testing at this time nor does she need acute hospitalization.  I recommended close follow-up with the GI team.  Final Clinical Impressions(s) / ED Diagnoses   Final diagnoses:  Upper abdominal pain  Nausea and vomiting, intractability of vomiting not specified, unspecified vomiting type  Elevated liver function tests  Gallbladder sludge  Alcohol abuse    New Prescriptions New Prescriptions   CHLORDIAZEPOXIDE (LIBRIUM) 25 MG CAPSULE    50mg  PO TID x 1D, then 25-50mg  PO BID X 1D, then 25-50mg  PO QD X 1D   METOCLOPRAMIDE (REGLAN) 10 MG TABLET    Take 1 tablet (10 mg total) by mouth every 6 (six) hours as needed for nausea or  vomiting.   ONDANSETRON (ZOFRAN ODT) 8 MG DISINTEGRATING TABLET    Take 1 tablet (8 mg total) by mouth every 8 (eight) hours as needed for nausea or vomiting.   PANTOPRAZOLE (PROTONIX) 40 MG TABLET    Take 1 tablet (40 mg total) by mouth 2 (two) times daily.   PROMETHAZINE (PHENERGAN) 25 MG SUPPOSITORY    Place 1 suppository (25 mg total) rectally every 6 (six) hours as needed for nausea or vomiting.     Jola Schmidt, MD 05/24/16 [phone]

## 2016-05-25 LAB — HEPATITIS PANEL, ACUTE
HEP A IGM: NEGATIVE
HEP B S AG: NEGATIVE
Hep B C IgM: NEGATIVE

## 2016-06-01 ENCOUNTER — Ambulatory Visit: Payer: Managed Care, Other (non HMO) | Admitting: Physician Assistant

## 2016-07-12 ENCOUNTER — Ambulatory Visit: Payer: Managed Care, Other (non HMO) | Admitting: Internal Medicine

## 2016-09-25 ENCOUNTER — Ambulatory Visit: Payer: Managed Care, Other (non HMO) | Admitting: Gastroenterology

## 2016-10-17 ENCOUNTER — Telehealth: Payer: Self-pay | Admitting: Internal Medicine

## 2016-10-17 NOTE — Telephone Encounter (Signed)
Patient reports that she is having abdominal pain and vomiting.  She is advised that she will need to have an appt for evaluation.Marland Kitchen  She will come in tomorrow for an appt with Alonza Bogus, PA at 3:00

## 2016-10-18 ENCOUNTER — Ambulatory Visit: Payer: Managed Care, Other (non HMO) | Admitting: Gastroenterology

## 2016-11-13 ENCOUNTER — Ambulatory Visit (HOSPITAL_BASED_OUTPATIENT_CLINIC_OR_DEPARTMENT_OTHER)
Admission: RE | Admit: 2016-11-13 | Discharge: 2016-11-13 | Disposition: A | Discharge status: Home / Self Care | Payer: Managed Care, Other (non HMO) | Source: Ambulatory Visit | Attending: Family Medicine | Admitting: Family Medicine

## 2016-11-13 ENCOUNTER — Other Ambulatory Visit: Payer: Self-pay | Admitting: Family Medicine

## 2016-11-13 DIAGNOSIS — R1013 Epigastric pain: Secondary | ICD-10-CM

## 2016-11-13 DIAGNOSIS — R932 Abnormal findings on diagnostic imaging of liver and biliary tract: Secondary | ICD-10-CM | POA: Diagnosis not present

## 2016-11-13 DIAGNOSIS — R160 Hepatomegaly, not elsewhere classified: Secondary | ICD-10-CM | POA: Diagnosis not present

## 2016-12-03 ENCOUNTER — Other Ambulatory Visit: Payer: Self-pay | Admitting: Family Medicine

## 2016-12-03 DIAGNOSIS — R17 Unspecified jaundice: Secondary | ICD-10-CM

## 2016-12-03 DIAGNOSIS — K828 Other specified diseases of gallbladder: Secondary | ICD-10-CM

## 2016-12-10 ENCOUNTER — Other Ambulatory Visit (HOSPITAL_COMMUNITY): Payer: Self-pay | Admitting: Gastroenterology

## 2016-12-10 DIAGNOSIS — R748 Abnormal levels of other serum enzymes: Secondary | ICD-10-CM

## 2016-12-13 ENCOUNTER — Encounter (HOSPITAL_COMMUNITY): Payer: Self-pay

## 2016-12-13 ENCOUNTER — Encounter (HOSPITAL_COMMUNITY)
Admission: RE | Admit: 2016-12-13 | Discharge: 2016-12-13 | Disposition: A | Discharge status: Home / Self Care | Payer: Managed Care, Other (non HMO) | Source: Ambulatory Visit | Attending: Family Medicine | Admitting: Family Medicine

## 2016-12-13 DIAGNOSIS — R17 Unspecified jaundice: Secondary | ICD-10-CM

## 2016-12-13 DIAGNOSIS — K828 Other specified diseases of gallbladder: Secondary | ICD-10-CM

## 2016-12-13 MED ORDER — TECHNETIUM TC 99M MEBROFENIN IV KIT
7.6000 | PACK | Freq: Once | INTRAVENOUS | Status: AC | PRN
  Administered 2016-12-13: 7.6 via INTRAVENOUS

## 2016-12-17 ENCOUNTER — Ambulatory Visit (HOSPITAL_COMMUNITY)
Admission: RE | Admit: 2016-12-17 | Discharge: 2016-12-17 | Disposition: A | Discharge status: Home / Self Care | Payer: Managed Care, Other (non HMO) | Source: Ambulatory Visit | Attending: Family Medicine | Admitting: Family Medicine

## 2016-12-17 DIAGNOSIS — K828 Other specified diseases of gallbladder: Secondary | ICD-10-CM | POA: Diagnosis not present

## 2016-12-17 DIAGNOSIS — R17 Unspecified jaundice: Secondary | ICD-10-CM | POA: Diagnosis not present

## 2016-12-17 MED ORDER — MORPHINE SULFATE (PF) 4 MG/ML IV SOLN
2.6000 mg | Freq: Once | INTRAVENOUS | Status: AC
  Administered 2016-12-17: 2.6 mg via INTRAVENOUS

## 2016-12-17 MED ORDER — TECHNETIUM TC 99M MEBROFENIN IV KIT
7.4000 | PACK | Freq: Once | INTRAVENOUS | Status: AC | PRN
  Administered 2016-12-17: 7.4 via INTRAVENOUS

## 2016-12-17 MED ORDER — MORPHINE SULFATE (PF) 4 MG/ML IV SOLN
INTRAVENOUS | Status: AC
  Filled 2016-12-17: qty 1

## 2016-12-18 ENCOUNTER — Other Ambulatory Visit: Payer: Self-pay | Admitting: Physician Assistant

## 2016-12-19 ENCOUNTER — Ambulatory Visit (HOSPITAL_COMMUNITY): Admission: RE | Admit: 2016-12-19 | Payer: Managed Care, Other (non HMO) | Source: Ambulatory Visit

## 2016-12-26 ENCOUNTER — Other Ambulatory Visit: Payer: Self-pay | Admitting: Radiology

## 2016-12-26 ENCOUNTER — Other Ambulatory Visit: Payer: Self-pay | Admitting: General Surgery

## 2016-12-27 ENCOUNTER — Encounter (HOSPITAL_COMMUNITY): Payer: Self-pay

## 2016-12-27 ENCOUNTER — Ambulatory Visit (HOSPITAL_COMMUNITY)
Admission: RE | Admit: 2016-12-27 | Discharge: 2016-12-27 | Disposition: A | Discharge status: Home / Self Care | Payer: Managed Care, Other (non HMO) | Source: Ambulatory Visit | Attending: Gastroenterology | Admitting: Gastroenterology

## 2016-12-27 ENCOUNTER — Other Ambulatory Visit (HOSPITAL_COMMUNITY): Payer: Self-pay | Admitting: Gastroenterology

## 2016-12-27 DIAGNOSIS — F419 Anxiety disorder, unspecified: Secondary | ICD-10-CM | POA: Diagnosis not present

## 2016-12-27 DIAGNOSIS — R791 Abnormal coagulation profile: Secondary | ICD-10-CM

## 2016-12-27 DIAGNOSIS — R109 Unspecified abdominal pain: Secondary | ICD-10-CM | POA: Diagnosis not present

## 2016-12-27 DIAGNOSIS — F329 Major depressive disorder, single episode, unspecified: Secondary | ICD-10-CM | POA: Diagnosis not present

## 2016-12-27 DIAGNOSIS — K219 Gastro-esophageal reflux disease without esophagitis: Secondary | ICD-10-CM | POA: Insufficient documentation

## 2016-12-27 DIAGNOSIS — R748 Abnormal levels of other serum enzymes: Secondary | ICD-10-CM

## 2016-12-27 DIAGNOSIS — Z8719 Personal history of other diseases of the digestive system: Secondary | ICD-10-CM | POA: Diagnosis not present

## 2016-12-27 DIAGNOSIS — E063 Autoimmune thyroiditis: Secondary | ICD-10-CM | POA: Diagnosis not present

## 2016-12-27 LAB — HCG, SERUM, QUALITATIVE: Preg, Serum: NEGATIVE

## 2016-12-27 LAB — CBC
HEMATOCRIT: 33.8 % — AB (ref 36.0–46.0)
HEMOGLOBIN: 11.5 g/dL — AB (ref 12.0–15.0)
MCH: 35.3 pg — AB (ref 26.0–34.0)
MCHC: 34 g/dL (ref 30.0–36.0)
MCV: 103.7 fL — ABNORMAL HIGH (ref 78.0–100.0)
Platelets: 143 10*3/uL — ABNORMAL LOW (ref 150–400)
RBC: 3.26 MIL/uL — ABNORMAL LOW (ref 3.87–5.11)
RDW: 16 % — ABNORMAL HIGH (ref 11.5–15.5)
WBC: 7.2 10*3/uL (ref 4.0–10.5)

## 2016-12-27 LAB — PROTIME-INR
INR: 1.65
Prothrombin Time: 19.7 seconds — ABNORMAL HIGH (ref 11.4–15.2)

## 2016-12-27 LAB — APTT: aPTT: 40 seconds — ABNORMAL HIGH (ref 24–36)

## 2016-12-27 MED ORDER — LIDOCAINE HCL (PF) 1 % IJ SOLN
INTRAMUSCULAR | Status: AC
Start: 2016-12-27 — End: 2016-12-27
  Administered 2016-12-27: 10 mL
  Filled 2016-12-27: qty 30

## 2016-12-27 MED ORDER — FENTANYL CITRATE (PF) 100 MCG/2ML IJ SOLN
INTRAMUSCULAR | Status: AC | PRN
  Administered 2016-12-27: 50 ug via INTRAVENOUS
  Administered 2016-12-27 (×2): 25 ug via INTRAVENOUS

## 2016-12-27 MED ORDER — FENTANYL CITRATE (PF) 100 MCG/2ML IJ SOLN
INTRAMUSCULAR | Status: AC
  Filled 2016-12-27: qty 2

## 2016-12-27 MED ORDER — SODIUM CHLORIDE 0.9 % IV SOLN
INTRAVENOUS | Status: DC
  Administered 2016-12-27: 20 mL/h via INTRAVENOUS

## 2016-12-27 MED ORDER — MIDAZOLAM HCL 2 MG/2ML IJ SOLN
INTRAMUSCULAR | Status: AC | PRN
  Administered 2016-12-27 (×2): 1 mg via INTRAVENOUS

## 2016-12-27 MED ORDER — OXYCODONE HCL 5 MG PO TABS
ORAL_TABLET | ORAL | Status: AC
  Filled 2016-12-27: qty 1

## 2016-12-27 MED ORDER — OXYCODONE HCL 5 MG PO TABS
5.0000 mg | ORAL_TABLET | ORAL | Status: DC | PRN
  Administered 2016-12-27: 5 mg via ORAL

## 2016-12-27 MED ORDER — LIDOCAINE HCL (PF) 1 % IJ SOLN
INTRAMUSCULAR | Status: AC
  Filled 2016-12-27: qty 30

## 2016-12-27 MED ORDER — GELATIN ABSORBABLE 12-7 MM EX MISC
CUTANEOUS | Status: AC
  Administered 2016-12-27: 15:00:00
  Filled 2016-12-27: qty 1

## 2016-12-27 MED ORDER — MIDAZOLAM HCL 2 MG/2ML IJ SOLN
INTRAMUSCULAR | Status: AC
  Filled 2016-12-27: qty 2

## 2016-12-27 NOTE — Sedation Documentation (Signed)
Patient is resting comfortably. 

## 2016-12-27 NOTE — H&P (Signed)
Chief Complaint: elevated LFTs  Referring Physician:Dr. Arta Silence  Supervising Physician: Markus Daft  Patient Status: [facility] - Out-pt  HPI: Patricia Lutz is a 31 y.o. female with a history of elevated liver enzymes for some time.  She also has multiple abdominal complaints such as pain, nausea, vomiting, and diarrhea on a daily basis.  She states she can't eat, she just drinks her calories.  She does drink wine.  She has been followed by [facility] until recently she switched to Dr. Paulita Fujita.  Last year she underwent a colonoscopy and endoscopy which were normal.  She had a HIDA scan completed recently to evaluate her abdominal pain, which was normal.  Due to persistently elevated LFTs and abdominal symptoms, a request has been made for a random liver biopsy.  Past Medical History:  Past Medical History:  Diagnosis Date  . Anxiety   . Depression   . GERD (gastroesophageal reflux disease)   . Hashimoto's disease   . History of stomach ulcers   . Hypothyroidism   . Substance abuse    alcohol    Past Surgical History:  Past Surgical History:  Procedure Laterality Date  . CESAREAN SECTION      Family History:  Family History  Problem Relation Age of Onset  . Crohn's disease Mother   . Crohn's disease Maternal Grandmother   . Diabetes Brother     Social History:  reports that she has never smoked. She does not have any smokeless tobacco history on file. She reports that she drinks about 21.0 oz of alcohol per week . She reports that she does not use drugs.  Allergies: No Known Allergies  Medications: Medications reviewed in epic  Please HPI for pertinent positives, otherwise complete 10 system ROS negative.  Mallampati Score: MD Evaluation Airway: WNL Heart: WNL Abdomen: WNL Chest/ Lungs: WNL ASA  Classification: 2 Mallampati/Airway Score: One  Physical Exam: BP 112/78   Pulse 87   Temp 98.3 F (36.8 C)   Ht 5\' 2"  (1.575 m)   Wt 138 lb 5 oz (62.7 kg)   LMP   (LMP Unknown) Comment: IUD  SpO2 99%   BMI 25.30 kg/m  Body mass index is 25.3 kg/m. General: pleasant, WD, WN white female who is laying in bed in NAD HEENT: head is normocephalic, atraumatic.  Sclera are noninjected.  PERRL.  Ears and nose without any masses or lesions.  Mouth is pink and moist Heart: regular, rate, and rhythm.  Normal s1,s2. No obvious murmurs, gallops, or rubs noted.  Palpable radial and pedal pulses bilaterally Lungs: CTAB, no wheezes, rhonchi, or rales noted.  Respiratory effort nonlabored Abd: soft, tender in upper abdomen, ND, +BS, no masses, hernias, or organomegaly Psych: A&Ox3 with an appropriate affect.   Labs: pending  Imaging: No results found.  Assessment/Plan 1. Elevated LFTs  We will plan to proceed with her liver biopsy today.  We did get a message today to be on the lookout for an abnormal PT/INR and platelet count from Dr. Erlinda Hong office.  These will be checked and we will make sure these are normal prior to proceeding.  Risks and benefits discussed with the patient including, but not limited to bleeding, infection, damage to adjacent structures or low yield requiring additional tests. All of the patient's questions were answered, patient is agreeable to proceed. Consent signed and in chart.  Thank you for this interesting consult.  I greatly enjoyed meeting Patricia Lutz and look forward to participating in  their care.  A copy of this report was sent to the requesting provider on this date.  Electronically Signed: Henreitta Cea 12/27/2016, 12:21 PM   I spent a total of  30 Minutes   in face to face in clinical consultation, greater than 50% of which was counseling/coordinating care for elevated LFTs

## 2016-12-27 NOTE — Procedures (Signed)
US guided core biopsies from right hepatic lobe.  Total of 4 cores performed, 3 adequate samples obtained.  Tract embolized with Gelfoam.  Minimal blood loss and no immediate complication.

## 2016-12-27 NOTE — Discharge Instructions (Signed)
Liver Biopsy, Care After These instructions give you information on caring for yourself after your procedure. Your doctor may also give you more specific instructions. Call your doctor if you have any problems or questions after your procedure. Follow these instructions at home:  Rest at home for 1-2 days or as told by your doctor.  Have someone stay with you for at least 24 hours.  Do not do these things in the first 24 hours: ? Drive. ? Use machinery. ? Take care of other people. ? Sign legal documents. ? Take a bath or shower.  There are many different ways to close and cover a cut (incision). For example, a cut can be closed with stitches, skin glue, or adhesive strips. Follow your doctor's instructions on: ? Taking care of your cut. ? Changing and removing your bandage (dressing). ? Removing whatever was used to close your cut.  Do not drink alcohol in the first week.  Do not lift more than 5 pounds or play contact sports for the first 2 weeks.  Take medicines only as told by your doctor. For 1 week, do not take medicine that has aspirin in it or medicines like ibuprofen.  Get your test results. Contact a doctor if:  A cut bleeds and leaves more than just a small spot of blood.  A cut is red, puffs up (swells), or hurts more than before.  Fluid or something else comes from a cut.  A cut smells bad.  You have a fever or chills. Get help right away if:  You have swelling, bloating, or pain in your belly (abdomen).  You get dizzy or faint.  You have a rash.  You feel sick to your stomach (nauseous) or throw up (vomit).  You have trouble breathing, feel short of breath, or feel faint.  Your chest hurts.  You have problems talking or seeing.  You have trouble balancing or moving your arms or legs. This information is not intended to replace advice given to you by your health care provider. Make sure you discuss any questions you have with your health care  provider. Document Released: 02/14/2008 Document Revised: 10/13/2015 Document Reviewed: 07/03/2013 Elsevier Interactive Patient Education  2018 [place]. Moderate Conscious Sedation, Adult Sedation is the use of medicines to promote relaxation and relieve discomfort and anxiety. Moderate conscious sedation is a type of sedation. Under moderate conscious sedation, you are less alert than normal, but you are still able to respond to instructions, touch, or both. Moderate conscious sedation is used during short medical and dental procedures. It is milder than deep sedation, which is a type of sedation under which you cannot be easily woken up. It is also milder than general anesthesia, which is the use of medicines to make you unconscious. Moderate conscious sedation allows you to return to your regular activities sooner. Tell a health care provider about:  Any allergies you have.  All medicines you are taking, including vitamins, herbs, eye drops, creams, and over-the-counter medicines.  Use of steroids (by mouth or creams).  Any problems you or family members have had with sedatives and anesthetic medicines.  Any blood disorders you have.  Any surgeries you have had.  Any medical conditions you have, such as sleep apnea.  Whether you are pregnant or may be pregnant.  Any use of cigarettes, alcohol, marijuana, or street drugs. What are the risks? Generally, this is a safe procedure. However, problems may occur, including:  Getting too much medicine (oversedation).  Nausea.  Allergic reaction to medicines.  Trouble breathing. If this happens, a breathing tube may be used to help with breathing. It will be removed when you are awake and breathing on your own.  Heart trouble.  Lung trouble.  What happens before the procedure? Staying hydrated Follow instructions from your health care provider about hydration, which may include:  Up to 2 hours before the procedure - you may  continue to drink clear liquids, such as water, clear fruit juice, black coffee, and plain tea.  Eating and drinking restrictions Follow instructions from your health care provider about eating and drinking, which may include:  8 hours before the procedure - stop eating heavy meals or foods such as meat, fried foods, or fatty foods.  6 hours before the procedure - stop eating light meals or foods, such as toast or cereal.  6 hours before the procedure - stop drinking milk or drinks that contain milk.  2 hours before the procedure - stop drinking clear liquids.  Medicine  Ask your health care provider about:  Changing or stopping your regular medicines. This is especially important if you are taking diabetes medicines or blood thinners.  Taking medicines such as aspirin and ibuprofen. These medicines can thin your blood. Do not take these medicines before your procedure if your health care provider instructs you not to.  Tests and exams  You will have a physical exam.  You may have blood tests done to show: ? How well your kidneys and liver are working. ? How well your blood can clot. General instructions  Plan to have someone take you home from the hospital or clinic.  If you will be going home right after the procedure, plan to have someone with you for 24 hours. What happens during the procedure?  An IV tube will be inserted into one of your veins.  Medicine to help you relax (sedative) will be given through the IV tube.  The medical or dental procedure will be performed. What happens after the procedure?  Your blood pressure, heart rate, breathing rate, and blood oxygen level will be monitored often until the medicines you were given have worn off.  Do not drive for 24 hours. This information is not intended to replace advice given to you by your health care provider. Make sure you discuss any questions you have with your health care provider. Document Released:  01/30/2001 Document Revised: 10/11/2015 Document Reviewed: 08/27/2015 Elsevier Interactive Patient Education  [employer].

## 2017-04-15 ENCOUNTER — Ambulatory Visit (HOSPITAL_COMMUNITY): Payer: Managed Care, Other (non HMO) | Admitting: Psychology

## 2018-07-12 ENCOUNTER — Ambulatory Visit (HOSPITAL_COMMUNITY): Payer: Self-pay | Admitting: Psychiatry

## 2018-07-12 ENCOUNTER — Ambulatory Visit (INDEPENDENT_AMBULATORY_CARE_PROVIDER_SITE_OTHER): Payer: 59 | Admitting: Psychiatry

## 2018-07-12 ENCOUNTER — Encounter (HOSPITAL_COMMUNITY): Payer: Self-pay | Admitting: Psychiatry

## 2018-07-12 ENCOUNTER — Encounter (INDEPENDENT_AMBULATORY_CARE_PROVIDER_SITE_OTHER): Payer: Self-pay

## 2018-07-12 VITALS — BP 118/64 | Ht 62.0 in | Wt 120.0 lb

## 2018-07-12 DIAGNOSIS — F411 Generalized anxiety disorder: Secondary | ICD-10-CM | POA: Diagnosis not present

## 2018-07-12 DIAGNOSIS — F515 Nightmare disorder: Secondary | ICD-10-CM | POA: Diagnosis not present

## 2018-07-12 DIAGNOSIS — F1021 Alcohol dependence, in remission: Secondary | ICD-10-CM

## 2018-07-12 MED ORDER — GABAPENTIN 300 MG PO CAPS: 300.0000 mg | ORAL_CAPSULE | Freq: Three times a day (TID) | ORAL | 0 refills | Status: DC
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
## 2018-08-09 ENCOUNTER — Ambulatory Visit (HOSPITAL_COMMUNITY): Payer: Managed Care, Other (non HMO) | Admitting: Psychiatry

## 2018-08-11 ENCOUNTER — Ambulatory Visit (HOSPITAL_COMMUNITY): Payer: 59 | Admitting: Licensed Clinical Social Worker

## 2018-08-12 ENCOUNTER — Other Ambulatory Visit (HOSPITAL_COMMUNITY): Payer: Self-pay | Admitting: Psychiatry

## 2018-08-12 MED ORDER — GABAPENTIN 300 MG PO CAPS: 300.0000 mg | ORAL_CAPSULE | Freq: Three times a day (TID) | ORAL | 0 refills | Status: DC

## 2018-09-08 ENCOUNTER — Ambulatory Visit (INDEPENDENT_AMBULATORY_CARE_PROVIDER_SITE_OTHER): Payer: 59 | Admitting: Licensed Clinical Social Worker

## 2018-09-08 ENCOUNTER — Other Ambulatory Visit: Payer: Self-pay

## 2018-09-08 DIAGNOSIS — F1021 Alcohol dependence, in remission: Secondary | ICD-10-CM | POA: Diagnosis not present

## 2018-09-08 DIAGNOSIS — F411 Generalized anxiety disorder: Secondary | ICD-10-CM | POA: Diagnosis not present

## 2018-09-08 NOTE — Progress Notes (Deleted)
Virtual Visit via Video Note  I connected with Patricia Lutz on 09/08/18 at  4:00 PM EDT by a video enabled telemedicine application and verified that I am speaking with the correct person using two identifiers.   I discussed the limitations of evaluation and management by telemedicine and the availability of in person appointments. The patient expressed understanding and agreed to proceed.    I discussed the assessment and treatment plan with the patient. The patient was provided an opportunity to ask questions and all were answered. The patient agreed with the plan and demonstrated an understanding of the instructions.   The patient was advised to call back or seek an in-person evaluation if the symptoms worsen or if the condition fails to improve as anticipated.  I provided 60 minutes of non-face-to-face time during this encounter.

## 2018-09-09 ENCOUNTER — Ambulatory Visit (INDEPENDENT_AMBULATORY_CARE_PROVIDER_SITE_OTHER): Payer: 59 | Admitting: Psychiatry

## 2018-09-09 DIAGNOSIS — F411 Generalized anxiety disorder: Secondary | ICD-10-CM | POA: Diagnosis not present

## 2018-09-09 DIAGNOSIS — F1021 Alcohol dependence, in remission: Secondary | ICD-10-CM | POA: Diagnosis not present

## 2018-09-09 DIAGNOSIS — F515 Nightmare disorder: Secondary | ICD-10-CM | POA: Diagnosis not present

## 2018-09-09 MED ORDER — MELOXICAM 15 MG PO TABS: 15.0000 mg | ORAL_TABLET | Freq: Every day | ORAL | 2 refills | Status: AC | PRN

## 2018-09-09 MED ORDER — ALPRAZOLAM 0.25 MG PO TABS: 0.2500 mg | ORAL_TABLET | Freq: Two times a day (BID) | ORAL | 2 refills | Status: DC | PRN

## 2018-09-09 MED ORDER — ESZOPICLONE 1 MG PO TABS: 1.0000 mg | ORAL_TABLET | Freq: Every evening | ORAL | 2 refills | Status: DC | PRN
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
## 2018-09-22 ENCOUNTER — Other Ambulatory Visit: Payer: Self-pay

## 2018-09-22 ENCOUNTER — Ambulatory Visit (INDEPENDENT_AMBULATORY_CARE_PROVIDER_SITE_OTHER): Payer: 59 | Admitting: Licensed Clinical Social Worker

## 2018-09-22 DIAGNOSIS — F1021 Alcohol dependence, in remission: Secondary | ICD-10-CM | POA: Diagnosis not present

## 2018-09-22 DIAGNOSIS — F411 Generalized anxiety disorder: Secondary | ICD-10-CM | POA: Diagnosis not present

## 2018-09-22 NOTE — Progress Notes (Addendum)
Virtual Visit via Telephone Note  I connected with Patricia Lutz on 09/22/18 at  4:00 PM EDT by telephone and verified that I am speaking with the correct person using two identifiers. Video chat did not have volume, phone contact used.   I discussed the limitations, risks, security and privacy concerns of performing an evaluation and management service by telephone and the availability of in person appointments. I also discussed with the patient that there may be a patient responsible charge related to this service. The patient expressed understanding and agreed to proceed.   I discussed the assessment and treatment plan with the patient. The patient was provided an opportunity to ask questions and all were answered. The patient agreed with the plan and demonstrated an understanding of the instructions.   The patient was advised to call back or seek an in-person evaluation if the symptoms worsen or if the condition fails to improve as anticipated.  I provided 45 minutes of non-face-to-face time during this encounter.   Karissa A Brone, LCSW    THERAPIST PROGRESS NOTE  Session Time: 4;05pm-4;50pm  Participation Level: Active  Behavioral Response: CasualAlertEuthymic  Type of Therapy: Individual Therapy  Treatment Goals addressed: Coping  Interventions: CBT, Motivational Interviewing and Supportive  Summary: Patricia Lutz is a 32 y.o. female who presents with reported managed symptoms of anxiety and alcohol use disorder in early remission. Client shared frustration with friends/family continuing to drink around her without asking if it was okay. Client identified she has not stated to others she feels uncomfortable being the only sober person in a gathering. Client notes she does not want to risk judgement for not drinking and instead isolates. Client is somewhat receptive to finding sober social circle through AA or [facility] however when previously attending she did not find people her age.  Client acknowledged she expected mind reading from others related to social situations. Client identified patterns of unhealthy relationship dynamics with her mother which she is specifically trying to avoid with her children. Client notes that she sometimes feels not worthy based on some of her mothers comments growing up. Client shows progress toward goals as evidence by managed symptoms of anxiety and maintaining sobriety.  Suicidal/Homicidal: Nowithout intent/plan  Therapist Response: Clinician met with client, assessing for SI/HI/psychosis and substance abuse. Clinician inquired about changes in stressors including family and health. Clinician and client processed common family patterns related to substance use and dysfunctional family dynamics. Clinician and client discussed client lack of sober, social support system. Clinician encouraged Al-Anon and online AA to find peers. Clinician developed discrepancies with client wanting to make changes and dismissing ideas, as well as client wanting others to be mindful of her needs without expressing them. Clinician and client discussed family dynamics leading to changes in view of self.  Plan: Return again in 1-2 weeks.  Diagnosis: Axis I: Alcohol Abuse and Generalized Anxiety Disorder     Karissa A Brone, LCSW 09/22/2018  

## 2018-09-29 ENCOUNTER — Ambulatory Visit (INDEPENDENT_AMBULATORY_CARE_PROVIDER_SITE_OTHER): Payer: 59 | Admitting: Licensed Clinical Social Worker

## 2018-09-29 ENCOUNTER — Other Ambulatory Visit: Payer: Self-pay

## 2018-09-29 DIAGNOSIS — F1021 Alcohol dependence, in remission: Secondary | ICD-10-CM

## 2018-09-29 DIAGNOSIS — F411 Generalized anxiety disorder: Secondary | ICD-10-CM

## 2018-09-29 NOTE — Progress Notes (Signed)
Virtual Visit via Video Note  I connected with Patricia Lutz on 09/29/18 at  4:00 PM EDT by a video enabled telemedicine application and verified that I am speaking with the correct person using two identifiers.   I discussed the limitations of evaluation and management by telemedicine and the availability of in person appointments. The patient expressed understanding and agreed to proceed.  I discussed the assessment and treatment plan with the patient. The patient was provided an opportunity to ask questions and all were answered. The patient agreed with the plan and demonstrated an understanding of the instructions.   The patient was advised to call back or seek an in-person evaluation if the symptoms worsen or if the condition fails to improve as anticipated.  I provided 60 minutes of non-face-to-face time during this encounter.   Olegario Messier, LCSW    THERAPIST PROGRESS NOTE  Session Time: 4pm-5pm  Participation Level: Active  Behavioral Response: CasualAlertIrritable  Type of Therapy: Individual Therapy  Treatment Goals addressed: Coping  Interventions: CBT, Motivational Interviewing and Supportive  Summary: Patricia Lutz is a 33 y.o. female who presents with Generalized Anxiety and Alcohol Use Disorder in remission. Client engages in therapy as a requirement of Liver Transplant list. Client reports maintaining sobriety which is monitored by bloodwork by doctors. Client shares frustrating dynamics in family relationships with mother and changes in relationship with husband over time, especially related to the children and changes based on financial status.  Suicidal/Homicidal: Nowithout intent/plan  Therapist Response: [facility] met with client via telehealth to maintain health and safety standards durring pandemic. Clinician assessed client for SI/HI/psychosis and substance use. Clinician inquired about changes in stressors and skills used to address. Clinician inquire about  any triggers experienced. Clinician and client processed effects of family dynamics as a child on expectations as an adult. Clinican and client processed frustration with husbands lack of involvement and feeling unacknowledged.   Plan: Return again in 1-2 weeks.  Diagnosis: Axis I: Generalized Anxiety Disorder and Substance Abuse      Olegario Messier, LCSW 09/29/2018

## 2018-10-14 ENCOUNTER — Ambulatory Visit (INDEPENDENT_AMBULATORY_CARE_PROVIDER_SITE_OTHER): Payer: 59 | Admitting: Licensed Clinical Social Worker

## 2018-10-14 ENCOUNTER — Other Ambulatory Visit: Payer: Self-pay

## 2018-10-14 DIAGNOSIS — F411 Generalized anxiety disorder: Secondary | ICD-10-CM | POA: Diagnosis not present

## 2018-10-14 DIAGNOSIS — F1021 Alcohol dependence, in remission: Secondary | ICD-10-CM

## 2018-10-14 NOTE — Progress Notes (Signed)
Virtual Visit via Video Note  I connected with Patricia Lutz on 10/14/18 at  4:00 PM EDT by a video enabled telemedicine application and verified that I am speaking with the correct person using two identifiers.   I discussed the limitations of evaluation and management by telemedicine and the availability of in person appointments. The patient expressed understanding and agreed to proceed.  I discussed the assessment and treatment plan with the patient. The patient was provided an opportunity to ask questions and all were answered. The patient agreed with the plan and demonstrated an understanding of the instructions.   The patient was advised to call back or seek an in-person evaluation if the symptoms worsen or if the condition fails to improve as anticipated.  I provided 45 minutes of non-face-to-face time during this encounter.   Olegario Messier, LCSW    THERAPIST PROGRESS NOTE  Session Time: 4pm-4:45pm  Participation Level: Active  Behavioral Response: CasualAlertAnxious and Irritable  Type of Therapy: Individual Therapy  Treatment Goals addressed: Coping  Interventions: CBT, Motivational Interviewing and Supportive  Summary: Patricia Lutz is a 33 y.o. female who presents with symptoms of anxiety and alcohol use disorder in remission. Client shared update on stressors includingher son going to the hospital and being diagnosed with diabeties. Client notes trying to get in a rhythm with new care has caused her to delay getting her bloodwork done as requested by the doctor on her liver transplant team. Client does acknowledge that she does want to put her children first but that cannot be the case if she is dead. Client verbalizes concern about who will take care of the children while she is recovering from surgery as she cannot identify people she truly would trust to help care for her children. Client will use this time to plan and contact external agencies to discuss possibility of  respite care options.  Suicidal/Homicidal: Nowithout intent/plan  Therapist Response: Clinician met with client via telehealth due to maintaining safety standards during pandemic. Clinician inquired about client sobriety and any recent triggers. Clinician processed frustration with family members not being mindful of her sobriety and continuing to ofer drinks, as well as not being mindful of her sons diabetes and offering him cake. Clinician developed discrepancies with client between work getting onto the transplant list so far and not getting required blood work done at a requested time. Clinician challenged client to focus on what she can control for herself and helping her children feel supported, which client has identified as her priority.  Plan: Return again in 2 weeks.  Diagnosis: Axis I: Alcohol Abuse and Generalized Anxiety Disorder         Olegario Messier, LCSW 10/14/2018

## 2018-10-30 ENCOUNTER — Other Ambulatory Visit: Payer: Self-pay

## 2018-10-30 ENCOUNTER — Ambulatory Visit (INDEPENDENT_AMBULATORY_CARE_PROVIDER_SITE_OTHER): Payer: 59 | Admitting: Licensed Clinical Social Worker

## 2018-10-30 DIAGNOSIS — F411 Generalized anxiety disorder: Secondary | ICD-10-CM | POA: Diagnosis not present

## 2018-10-30 DIAGNOSIS — F1021 Alcohol dependence, in remission: Secondary | ICD-10-CM | POA: Diagnosis not present
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
## 2018-11-05 ENCOUNTER — Ambulatory Visit (HOSPITAL_COMMUNITY): Payer: 59 | Admitting: Psychiatry

## 2018-11-06 ENCOUNTER — Other Ambulatory Visit: Payer: Self-pay

## 2018-11-06 ENCOUNTER — Ambulatory Visit (HOSPITAL_COMMUNITY): Payer: 59 | Admitting: Licensed Clinical Social Worker

## 2018-11-06 MED ORDER — PROMETHAZINE HCL (BULK CHEMICALS - P'S)
25.00 | Status: DC
Start: ? — End: 2018-11-06

## 2018-11-06 MED ORDER — TUSSI PRES-B 2-15-200 MG/5ML PO LIQD
0.50 | ORAL | Status: DC
Start: ? — End: 2018-11-06

## 2018-11-06 MED ORDER — Medication
10.00 | Status: DC
Start: 2018-11-06 — End: 2018-11-06

## 2018-11-06 MED ORDER — QUINERVA 260 MG PO TABS
650.00 | ORAL_TABLET | ORAL | Status: DC
Start: ? — End: 2018-11-06

## 2018-11-06 MED ORDER — SENNA-PSYLLIUM 18-82 % PO GRAN
125.00 | GRANULES | ORAL | Status: DC
Start: 2018-11-07 — End: 2018-11-06

## 2018-11-06 MED ORDER — LIDOCAINE 5 % EX PTCH
1.00 | MEDICATED_PATCH | CUTANEOUS | Status: DC
Start: 2018-11-06 — End: 2018-11-06

## 2018-11-06 MED ORDER — SPIRONOLACTONE 50 MG PO TABS
50.00 | ORAL_TABLET | ORAL | Status: DC
Start: 2018-11-07 — End: 2018-11-06

## 2018-11-06 MED ORDER — Medication
40.00 | Status: DC
Start: 2018-11-07 — End: 2018-11-06

## 2018-11-06 MED ORDER — ISOVUE-M 300 61 % IJ SOLN
4.00 | INTRAMUSCULAR | Status: DC
Start: ? — End: 2018-11-06

## 2018-11-06 MED ORDER — DIGEX PO CAPS
500.00 | ORAL_CAPSULE | ORAL | Status: DC
Start: 2018-11-07 — End: 2018-11-06

## 2018-11-06 MED ORDER — BAYER WOMENS 81-300 MG PO TABS
40.00 | ORAL_TABLET | ORAL | Status: DC
Start: 2018-11-07 — End: 2018-11-06

## 2018-11-06 MED ORDER — OLANZAPINE-FLUOXETINE HCL 6-50 MG PO CAPS
3.00 | ORAL_CAPSULE | ORAL | Status: DC
Start: 2018-11-06 — End: 2018-11-06

## 2018-11-13 ENCOUNTER — Other Ambulatory Visit: Payer: Self-pay

## 2018-11-13 ENCOUNTER — Ambulatory Visit (HOSPITAL_COMMUNITY): Payer: 59 | Admitting: Licensed Clinical Social Worker

## 2018-11-27 ENCOUNTER — Other Ambulatory Visit: Payer: Self-pay

## 2018-11-27 ENCOUNTER — Ambulatory Visit (INDEPENDENT_AMBULATORY_CARE_PROVIDER_SITE_OTHER): Payer: 59 | Admitting: Licensed Clinical Social Worker

## 2018-11-27 ENCOUNTER — Ambulatory Visit (HOSPITAL_COMMUNITY): Payer: 59 | Admitting: Psychiatry

## 2018-11-27 DIAGNOSIS — F1021 Alcohol dependence, in remission: Secondary | ICD-10-CM | POA: Diagnosis not present

## 2018-11-27 DIAGNOSIS — F411 Generalized anxiety disorder: Secondary | ICD-10-CM

## 2018-11-28 ENCOUNTER — Ambulatory Visit (INDEPENDENT_AMBULATORY_CARE_PROVIDER_SITE_OTHER): Payer: 59 | Admitting: Psychiatry

## 2018-11-28 ENCOUNTER — Other Ambulatory Visit: Payer: Self-pay

## 2018-11-28 DIAGNOSIS — F515 Nightmare disorder: Secondary | ICD-10-CM

## 2018-11-28 DIAGNOSIS — F411 Generalized anxiety disorder: Secondary | ICD-10-CM

## 2018-11-28 DIAGNOSIS — F1021 Alcohol dependence, in remission: Secondary | ICD-10-CM | POA: Diagnosis not present

## 2018-11-28 MED ORDER — CLONAZEPAM 0.5 MG PO TABS: 0.5000 mg | ORAL_TABLET | Freq: Every evening | ORAL | 2 refills | Status: DC | PRN

## 2018-11-28 MED ORDER — ALPRAZOLAM 0.25 MG PO TABS: 0.2500 mg | ORAL_TABLET | Freq: Two times a day (BID) | ORAL | 2 refills | Status: DC | PRN

## 2018-11-28 NOTE — Progress Notes (Signed)
[facility] MD/PA/NP OP Progress Note  11/28/2018 10:47 AM Patricia Lutz  MRN:  [id number] Interview was conducted by phone and I verified that I was speaking with the correct person using two identifiers. I discussed the limitations of evaluation and management by telemedicine and  the availability of in person appointments. Patient expressed understanding and agreed to proceed.  Chief Complaint: Anxiety, sleep problems  HPI: 33 yo married female with mixed anxiety disorder (GAD, panic disorder now improved) and night terrors which per her report triggered long standing alcohol drinking problem. She has liver cirrhosis and is waiting for a liver transplant (her case will be reviewed by her team at [facility] in September). In  remission of alcohol use disorder since November 2019 (relepsed on her birthday when drank alcohol drink, given to her without her knowledge, x 1). Failed multiple SSRIs/SNRIs prescribed in the past for anxiety but admittedly was also heavily drinking at the time. She has been on benzodiazepines for over 15 years. She denied nightmares/flashbacks but admits to some anxietty related to her son being diagnosed with diabetes. We have continued alprazolam 0.25 mg prn anxiety/sleep - she cannot take it for prolonged period even though it helps with nightmares  She starts to feel more depressed. Back on clonazepam instead of Lunesta prn insomnia. We tried gabapentin for anxiety but she started to feel more moody with it and stopped.   Visit Diagnosis:    ICD-10-CM   1. 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 GAD (generalized anxiety disorder)  F41.1     Past Psychiatric History: Please see intake H&P.  Past Medical History:  Past Medical History:  Diagnosis Date  . Anxiety   . Depression   . GERD (gastroesophageal reflux disease)   . Hashimoto's disease   . History of stomach ulcers   . Hypothyroidism   . Substance abuse ([place])    alcohol    Past Surgical History:  Procedure Laterality Date  . CESAREAN SECTION      Family Psychiatric History: None  Family History:  Family History  Problem Relation Age of Onset  . Crohn's disease Mother   . Crohn's disease Maternal Grandmother   . Diabetes Brother     Social History:  Social History   Socioeconomic History  . Marital status: Unknown    Spouse name: Not on file  . Number of children: Not on file  . Years of education: Not on file  . Highest education level: Not on file  Occupational History  . Not on file  Social Needs  . Financial resource strain: Not on file  . Food insecurity    Worry: Not on file    Inability: Not on file  . Transportation needs    Medical: Not on file    Non-medical: Not on file  Tobacco Use  . Smoking status: Never Smoker  . Smokeless tobacco: Never Used  Substance and Sexual Activity  . Alcohol use: Not Currently    Alcohol/week: 0.0 standard drinks  . Drug use: No  . Sexual activity: Not on file  Lifestyle  . Physical activity    Days per week: Not on file    Minutes per session: Not on file  . Stress: Not on file  Relationships  . Social Herbalist on phone: Not on file    Gets together: Not on file    Attends religious service: Not on  file    Active member of club or organization: Not on file    Attends meetings of clubs or organizations: Not on file    Relationship status: Not on file  Other Topics Concern  . Not on file  Social History Narrative  . Not on file    Allergies: No Known Allergies  Metabolic Disorder Labs: No results found for: HGBA1C, MPG No results found for: PROLACTIN No results found for: CHOL, TRIG, HDL, CHOLHDL, VLDL, LDLCALC No results found for: TSH  Therapeutic Level Labs: No results found for: LITHIUM No results found for: VALPROATE No components found for:  CBMZ  Current Medications: Current Outpatient Medications  Medication Sig Dispense Refill  .  clonazePAM (KLONOPIN) 0.5 MG tablet Take 1 tablet (0.5 mg total) by mouth at bedtime as needed (sleep). 30 tablet 2  . ALPRAZolam (XANAX) 0.25 MG tablet Take 1 tablet (0.25 mg total) by mouth 2 (two) times daily as needed for sleep. 60 tablet 2  . folic acid (FOLVITE) 1 MG tablet Take by mouth.    . furosemide (LASIX) 20 MG tablet Take by mouth.    Marland Kitchen HYDROmorphone (DILAUDID) 2 MG tablet Take by mouth.    . levothyroxine (SYNTHROID, LEVOTHROID) 125 MCG tablet Take 125 mcg by mouth daily before breakfast.     . meloxicam (MOBIC) 15 MG tablet Take 1 tablet (15 mg total) by mouth daily as needed for pain. 30 tablet 2  . midodrine (PROAMATINE) 10 MG tablet Take by mouth.    . ondansetron (ZOFRAN-ODT) 4 MG disintegrating tablet TAKE 1 TABLET (4 MG TOTAL) BY MOUTH EVERY 8 (EIGHT) HOURS AS NEEDED FOR NAUSEA FOR UP TO 14 DAYS    . oxycodone (OXY-IR) 5 MG capsule Take by mouth.    . pantoprazole (PROTONIX) 40 MG tablet Take 1 tablet (40 mg total) by mouth 2 (two) times daily. 60 tablet 0  . promethazine (PHENERGAN) 25 MG tablet Take by mouth.    . spironolactone (ALDACTONE) 50 MG tablet Take by mouth.     No current facility-administered medications for this visit.      Psychiatric Specialty Exam: Review of Systems  Psychiatric/Behavioral: The patient is nervous/anxious and has insomnia.   All other systems reviewed and are negative.   There were no vitals taken for this visit.There is no height or weight on file to calculate BMI.  General Appearance: NA  Eye Contact:  NA  Speech:  Clear and Coherent and Normal Rate  Volume:  Normal  Mood:  Anxious  Affect:  NA  Thought Process:  Goal Directed  Orientation:  Full (Time, Place, and Person)  Thought Content: Logical   Suicidal Thoughts:  No  Homicidal Thoughts:  No  Memory:  Immediate;   Good Recent;   Good Remote;   Good  Judgement:  Good  Insight:  Good  Psychomotor Activity:  Normal  Concentration:  Concentration: Good  Recall:  Good   Fund of Knowledge: Good  Language: Good  Akathisia:  Negative  Handed:  Right  AIMS (if indicated): not done  Assets:  Communication Skills Desire for Improvement Financial Resources/Insurance Housing Resilience Social Support Talents/Skills  ADL's:  Intact  Cognition: WNL  Sleep:  Fair   Assessment and Plan: 33 yo married female with mixed anxiety disorder (GAD, panic disorder now improved) and night terrors which per her report triggered long standing alcohol drinking problem. She has liver cirrhosis and is waiting for a liver transplant (her case will be reviewed by  her team at [facility] in September). In  remission of alcohol use disorder since November 2019 (relepsed on her birthday when drank alcohol drink, given to her without her knowledge, x 1). Failed multiple SSRIs/SNRIs prescribed in the past for anxiety but admittedly was also heavily drinking at the time. She has been on benzodiazepines for over 15 years. She denied nightmares/flashbacks but admits to some anxietty related to her son being diagnosed with diabetes. We have continued alprazolam 0.25 mg prn anxiety/sleep - she cannot take it for prolonged period even though it helps with nightmares  She starts to feel more depressed. Back on clonazepam instead of Lunesta prn insomnia. We tried gabapentin for anxiety but she started to feel more moody with it and stopped.   Dx: GAD, Nightmare disorder; Alcohol use disorder severe in early remission  Plan: Continue alprazolam prn anxiety and clonazepam  0.5 mg prn sleep. Next appointment in two months. The plan was discussed with patient who had an opportunity to ask questions and these were all answered. I spend 25 minutes in phone consultation with the patient.     Stephanie Acre, MD 11/28/2018, 10:47 AM

## 2018-12-01 ENCOUNTER — Other Ambulatory Visit (HOSPITAL_COMMUNITY): Payer: Self-pay

## 2018-12-04 NOTE — Progress Notes (Signed)
Virtual Visit via Video Note  I connected with Kristalyn Bergstresser on 11/27/2018 at 11:00 AM EDT by a video enabled telemedicine application and verified that I am speaking with the correct person using two identifiers.   I discussed the limitations of evaluation and management by telemedicine and the availability of in person appointments. The patient expressed understanding and agreed to proceed.  I discussed the assessment and treatment plan with the patient. The patient was provided an opportunity to ask questions and all were answered. The patient agreed with the plan and demonstrated an understanding of the instructions.   The patient was advised to call back or seek an in-person evaluation if the symptoms worsen or if the condition fails to improve as anticipated.  I provided 45 minutes of non-face-to-face time during this encounter.   Olegario Messier, LCSW    THERAPIST PROGRESS NOTE  Session Time: 11am-11:45  Participation Level: Active  Behavioral Response: CasualAlertAnxious, Depressed and Irritable  Type of Therapy: Individual Therapy  Treatment Goals addressed: Coping and Diagnosis: Alcohol Use Disorder  Interventions: CBT, Motivational Interviewing and Supportive  Summary: Patricia Lutz is a 33 y.o. female who presents with alcohol use disorder and generalized anxiety. Client reports since stopping drinking her nightmares have decreased and she often has multiple nights without nightmares. Client expressed frustration and hopelessness while on vacation due to family behaviors related to herself and her sons health. Client reports feeling frustrated that her family seemed dismissive of their problems offering her alcohol and her son sweets. Client is receptive to information related to ACOA and 'the addicted brain'  Suicidal/Homicidal: Nowithout intent/plan  Therapist Response: Clinician processed with client recent triggers for cravings, including environmental and emotional and  thought patterns. Clinician challenged client thoughts related to the possibility of relapse and the consequences. Clinician provided psycho-educational information on Adult Children of Alcoholics when client discussed relationship dynamics in the family. Clinican provided client information related to stages of relapse and addressing thoughts such as 'glamorizing lifestyle' of addiction leading to distorted thinking related to addiction vs recovery.  Plan: Return again in 2-3 weeks.  Diagnosis: Axis I: Alcohol Abuse and Generalized Anxiety Disorder      Olegario Messier, LCSW 11/27/2018

## 2018-12-11 ENCOUNTER — Other Ambulatory Visit: Payer: Self-pay

## 2018-12-11 ENCOUNTER — Ambulatory Visit (INDEPENDENT_AMBULATORY_CARE_PROVIDER_SITE_OTHER): Payer: 59 | Admitting: Licensed Clinical Social Worker

## 2018-12-11 DIAGNOSIS — F1021 Alcohol dependence, in remission: Secondary | ICD-10-CM

## 2018-12-11 DIAGNOSIS — F411 Generalized anxiety disorder: Secondary | ICD-10-CM | POA: Diagnosis not present

## 2018-12-11 NOTE — Progress Notes (Signed)
Virtual Visit via Telephone Note  I connected with Patricia Lutz on 12/11/2018 at 11:00 AM EDT by telephone and verified that I am speaking with the correct person using two identifiers.   I discussed the limitations, risks, security and privacy concerns of performing an evaluation and management service by telephone and the availability of in person appointments. I also discussed with the patient that there may be a patient responsible charge related to this service. The patient expressed understanding and agreed to proceed.   I discussed the assessment and treatment plan with the patient. The patient was provided an opportunity to ask questions and all were answered. The patient agreed with the plan and demonstrated an understanding of the instructions.   The patient was advised to call back or seek an in-person evaluation if the symptoms worsen or if the condition fails to improve as anticipated.  I provided 30 minutes of non-face-to-face time during this encounter.   Olegario Messier, LCSW    THERAPIST PROGRESS NOTE  Session Time: 11:15am-11:50am  Participation Level: Active  Behavioral Response: NAAlertAnxious, Depressed, Irritable and Worthless  Type of Therapy: Individual Therapy  Treatment Goals addressed: Coping and Diagnosis: Achieve and maintain sobriety AEB 0 drinks per week and use of healthy coping skills at least 1 time per day.  Interventions: CBT and Supportive  Summary: Patricia Lutz is a 33 y.o. female who presents with alcohol use disorder, generalized anxiety disorder. Client processed overwhelming feelings related to family visiting and feeling husband is unsupportive, including comments about not being a good mother.  Client reports that with all the stress and others already assuming she is not taking care of herself or her children she considered drinking, 'what's the point?' Client stated she did not consume alcohol as she wanted to continue getting healthy for her  children.  Suicidal/Homicidal: Nowithout intent/plan  Therapist Response: Clinician actively listened to client, validating feelings. Clinician reviewed with client some of the information linked with ACOAs. Clinician helped client see discrepancies between others comments and facts about her behaviors, such as all of the tasks she does to keep her children happy and healthy. Clinician assessed for SI/HI/psychosis and overall level of functioning. Clinician inquired about sleep, appetite, and medication compliance.  Plan: Return again in 1-2 weeks.  Diagnosis: Axis I: Alcohol Abuse and Generalized Anxiety Disorder        Olegario Messier, LCSW 12/11/2018

## 2018-12-25 ENCOUNTER — Ambulatory Visit (INDEPENDENT_AMBULATORY_CARE_PROVIDER_SITE_OTHER): Payer: 59 | Admitting: Licensed Clinical Social Worker

## 2018-12-25 ENCOUNTER — Other Ambulatory Visit: Payer: Self-pay

## 2018-12-25 DIAGNOSIS — F1021 Alcohol dependence, in remission: Secondary | ICD-10-CM

## 2018-12-25 DIAGNOSIS — F411 Generalized anxiety disorder: Secondary | ICD-10-CM | POA: Diagnosis not present

## 2018-12-26 NOTE — Progress Notes (Signed)
Virtual Visit via Video Note  I connected with Patricia Lutz on 12/25/2018 at 11:00 AM EDT by a video enabled telemedicine application and verified that I am speaking with the correct person using two identifiers.   I discussed the limitations of evaluation and management by telemedicine and the availability of in person appointments. The patient expressed understanding and agreed to proceed.  I discussed the assessment and treatment plan with the patient. The patient was provided an opportunity to ask questions and all were answered. The patient agreed with the plan and demonstrated an understanding of the instructions.   The patient was advised to call back or seek an in-person evaluation if the symptoms worsen or if the condition fails to improve as anticipated.  I provided 45 minutes of non-face-to-face time during this encounter.   Olegario Messier, LCSW    THERAPIST PROGRESS NOTE  Session Time: 11:10am-11:55am  Participation Level: Active  Behavioral Response: CasualAlertAnxious  Type of Therapy: Individual Therapy  Treatment Goals addressed: Coping and Diagnosis: Achieve and maintain sobriety AEB 0 uses of alcohol 7/7 days per week. Client will utilize healthy coping skills to address symptoms of anxiety and depression at least 1xday at least 4days per wek  Interventions: CBT, Motivational Interviewing and Supportive  Summary: Patricia Lutz is a 33 y.o. female who presents with alcohol use disorder. Client shared how she addressed feelings of frustration with brother from previous week, noting she saw behaviors of ACOA discussed in session. Client processed understanding why people give up on the transplant list as well as why people might return to active addiction, for the 'quick fix' of mood and stress relief. Client identified the online support groups she previously found helpful no longer fit her needs based on her progress. Client shared being motivated by seeing her son's  resiliency after being diagnosed with diabetes.    Suicidal/Homicidal: Nowithout intent/plan  Therapist Response: Clinician met with client, assessing for SI/HI/psychosis and overall level of functioning. Clinician followed up with client about stressors related to family visit from last month. Clinician provided psycho-education on cravings and the brain in relation to motivation level changes with sobriety after alcohol addiction. Clinician and client processed lack of support from family and alternative options for support based on needs.   Plan: Return again in 2 weeks.  Diagnosis: Axis I: Alcohol Abuse        Olegario Messier, LCSW 12/25/18

## 2019-01-08 ENCOUNTER — Ambulatory Visit (HOSPITAL_COMMUNITY): Payer: 59 | Admitting: Licensed Clinical Social Worker

## 2019-01-08 ENCOUNTER — Other Ambulatory Visit: Payer: Self-pay

## 2019-01-09 ENCOUNTER — Other Ambulatory Visit: Payer: Self-pay

## 2019-01-09 ENCOUNTER — Ambulatory Visit (INDEPENDENT_AMBULATORY_CARE_PROVIDER_SITE_OTHER): Payer: 59 | Admitting: Licensed Clinical Social Worker

## 2019-01-09 DIAGNOSIS — F1021 Alcohol dependence, in remission: Secondary | ICD-10-CM

## 2019-01-09 DIAGNOSIS — F411 Generalized anxiety disorder: Secondary | ICD-10-CM

## 2019-01-10 NOTE — Progress Notes (Signed)
  Virtual Visit via Telephone Note  I connected with Patricia Lutz on 01/09/2019 at 10:00 AM EDT by telephone and verified that I am speaking with the correct person using two identifiers.   I discussed the limitations, risks, security and privacy concerns of performing an evaluation and management service by telephone and the availability of in person appointments. I also discussed with the patient that there may be a patient responsible charge related to this service. The patient expressed understanding and agreed to proceed.  I discussed the assessment and treatment plan with the patient. The patient was provided an opportunity to ask questions and all were answered. The patient agreed with the plan and demonstrated an understanding of the instructions.   The patient was advised to call back or seek an in-person evaluation if the symptoms worsen or if the condition fails to improve as anticipated.  I provided 60 minutes of non-face-to-face time during this encounter.   Olegario Messier, LCSW   THERAPIST PROGRESS NOTE  Session Time: 10am-11am  Participation Level: Active  Behavioral Response: NAAlertAnxious, Dysphoric and Irritable  Type of Therapy: Individual Therapy  Treatment Goals addressed: Coping  Interventions: CBT, Motivational Interviewing and Supportive  Summary: Patricia Lutz is a 33 y.o. female who presents with alcohol use disorder, generalized anxiety. Client processed stressors with health, health of children, and scholl starting ackb. Client shows progress toward goals as evidence by maintaining sobriety and continuing to address automatic thoughts with healthy coping skills. Client continues to utilize CBT triangle to identify connections between thoughts, feelings, and behaviors to manage anxiety and thoughts of cravings.   Suicidal/Homicidal: Nowithout intent/plan  Therapist Response: Clinician met with client via telehealth. Clinician inquired about recent health or  family stressors. Clinician validated client concerns and feelings. Clinician continued to provide psycho-education on ACOA family dynamics and impact on development in future relationships. Clinician provided additional information of symtpoms to track, including levels of anxiety and identifying automatic negative thoughts in order to coninue challenging and changing thought processing related to anxiety.   Plan: Return again in 2 weeks.  Diagnosis: Axis I: Alcohol Abuse and Generalized Anxiety Disorder       Olegario Messier, LCSW 01/09/2019

## 2019-01-28 ENCOUNTER — Ambulatory Visit (INDEPENDENT_AMBULATORY_CARE_PROVIDER_SITE_OTHER): Payer: 59 | Admitting: Psychiatry

## 2019-01-28 ENCOUNTER — Other Ambulatory Visit: Payer: Self-pay

## 2019-01-28 DIAGNOSIS — F515 Nightmare disorder: Secondary | ICD-10-CM | POA: Diagnosis not present

## 2019-01-28 DIAGNOSIS — F411 Generalized anxiety disorder: Secondary | ICD-10-CM | POA: Diagnosis not present

## 2019-01-28 DIAGNOSIS — F1021 Alcohol dependence, in remission: Secondary | ICD-10-CM | POA: Diagnosis not present

## 2019-01-28 MED ORDER — ALPRAZOLAM 0.25 MG PO TABS: 0.2500 mg | ORAL_TABLET | Freq: Two times a day (BID) | ORAL | 2 refills | Status: DC | PRN

## 2019-01-28 NOTE — Progress Notes (Signed)
[place] MD/PA/NP OP Progress Note  01/28/2019 1:09 PM Patricia Lutz  MRN:  [id number] Interview was conducted by phone and I verified that I was speaking with the correct person using two identifiers. I discussed the limitations of evaluation and management by telemedicine and  the availability of in person appointments. Patient expressed understanding and agreed to proceed.  Chief Complaint: Anxiety (low).  HPI: 38 yomarriedfemale with mixed anxiety disorder (GAD, panic disorder now improved) and night terrors which per her report triggered long standing alcohol drinking problem. She has liver cirrhosis and is waiting for a liver transplant (her case will be reviewed by her team at [facility] this month). In  remission of alcohol use disorder since November 2019 (relepsed on her birthday when drank alcohol drink x 1). Failed multiple SSRIs/SNRIs prescribed in the past for anxiety but admittedly was also heavily drinking at the time. She has been on benzodiazepines for over 15 years. She denied nightmares/flashbacks lately. She takes low dose of alprazolam bid and very sporadically clonazepam for insomnia. She does not like the feeling after taking it - feels more depressed. Lunesta did not help with insomnia. We also tried gabapentin for anxiety but she started to feel more moody with it and stopped.   Visit Diagnosis:    ICD-10-CM   1. Alcohol use disorder, severe, in early remission ([place])  F10.21   2. 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  Past Medical History:  Past Medical History:  Diagnosis Date  . Anxiety   . Depression   . GERD (gastroesophageal reflux disease)   . Hashimoto's disease   . History of stomach ulcers   . Hypothyroidism   . Substance abuse ([place])    alcohol    Past Surgical History:  Procedure Laterality Date  . CESAREAN SECTION      Family Psychiatric History: None  Family History:   Family History  Problem Relation Age of Onset  . Crohn's disease Mother   . Crohn's disease Maternal Grandmother   . Diabetes Brother     Social History:  Social History   Socioeconomic History  . Marital status: Unknown    Spouse name: Not on file  . Number of children: Not on file  . Years of education: Not on file  . Highest education level: Not on file  Occupational History  . Not on file  Social Needs  . Financial resource strain: Not on file  . Food insecurity    Worry: Not on file    Inability: Not on file  . Transportation needs    Medical: Not on file    Non-medical: Not on file  Tobacco Use  . Smoking status: Never Smoker  . Smokeless tobacco: Never Used  Substance and Sexual Activity  . Alcohol use: Not Currently    Alcohol/week: 0.0 standard drinks  . Drug use: No  . Sexual activity: Not on file  Lifestyle  . Physical activity    Days per week: Not on file    Minutes per session: Not on file  . Stress: Not on file  Relationships  . Social Herbalist on phone: Not on file    Gets together: Not on file    Attends religious service: Not on file    Active member of club or organization: Not on file    Attends meetings of clubs or organizations: Not on  file    Relationship status: Not on file  Other Topics Concern  . Not on file  Social History Narrative  . Not on file    Allergies: No Known Allergies  Metabolic Disorder Labs: No results found for: HGBA1C, MPG No results found for: PROLACTIN No results found for: CHOL, TRIG, HDL, CHOLHDL, VLDL, LDLCALC No results found for: TSH  Therapeutic Level Labs: No results found for: LITHIUM No results found for: VALPROATE No components found for:  CBMZ  Current Medications: Current Outpatient Medications  Medication Sig Dispense Refill  . ALPRAZolam (XANAX) 0.25 MG tablet Take 1 tablet (0.25 mg total) by mouth 2 (two) times daily as needed for sleep. 60 tablet 2  . clonazePAM (KLONOPIN)  0.5 MG tablet Take 1 tablet (0.5 mg total) by mouth at bedtime as needed (sleep). 30 tablet 2  . folic acid (FOLVITE) 1 MG tablet Take by mouth.    . furosemide (LASIX) 20 MG tablet Take by mouth.    Marland Kitchen HYDROmorphone (DILAUDID) 2 MG tablet Take by mouth.    . levothyroxine (SYNTHROID, LEVOTHROID) 125 MCG tablet Take 125 mcg by mouth daily before breakfast.     . midodrine (PROAMATINE) 10 MG tablet Take by mouth.    . ondansetron (ZOFRAN-ODT) 4 MG disintegrating tablet TAKE 1 TABLET (4 MG TOTAL) BY MOUTH EVERY 8 (EIGHT) HOURS AS NEEDED FOR NAUSEA FOR UP TO 14 DAYS    . oxycodone (OXY-IR) 5 MG capsule Take by mouth.    . pantoprazole (PROTONIX) 40 MG tablet Take 1 tablet (40 mg total) by mouth 2 (two) times daily. 60 tablet 0  . promethazine (PHENERGAN) 25 MG tablet Take by mouth.    . spironolactone (ALDACTONE) 50 MG tablet Take by mouth.     No current facility-administered medications for this visit.       Psychiatric Specialty Exam: Review of Systems  Psychiatric/Behavioral: The patient is nervous/anxious.   All other systems reviewed and are negative.   There were no vitals taken for this visit.There is no height or weight on file to calculate BMI.  General Appearance: NA  Eye Contact:  NA  Speech:  Clear and Coherent and Normal Rate  Volume:  Normal  Mood:  Anxious  Affect:  NA  Thought Process:  Goal Directed and Linear  Orientation:  Full (Time, Place, and Person)  Thought Content: Logical   Suicidal Thoughts:  No  Homicidal Thoughts:  No  Memory:  Immediate;   Good Recent;   Good Remote;   Good  Judgement:  Good  Insight:  Good  Psychomotor Activity:  NA  Concentration:  Concentration: Good  Recall:  Good  Fund of Knowledge: Good  Language: Good  Akathisia:  Negative  Handed:  Right  AIMS (if indicated): not done  Assets:  Communication Skills Desire for Improvement Financial Resources/Insurance Housing Resilience Social Support  ADL's:  Intact  Cognition:  WNL  Sleep:  Fair     Assessment and Plan: 33 yomarriedfemale with mixed anxiety disorder (GAD, panic disorder now improved) and night terrors which per her report triggered long standing alcohol drinking problem. She has liver cirrhosis and is waiting for a liver transplant (her case will be reviewed by her team at [facility] this month). In  remission of alcohol use disorder since November 2019 (relepsed on her birthday when drank alcohol drink x 1). Failed multiple SSRIs/SNRIs prescribed in the past for anxiety but admittedly was also heavily drinking at the time. She has been on  benzodiazepines for over 15 years. She denied nightmares/flashbacks lately. She takes low dose of alprazolam bid and very sporadically clonazepam for insomnia. She does not like the feeling after taking it - feels more depressed. Lunesta did not help with insomnia. We also tried gabapentin for anxiety but she started to feel more moody with it and stopped.   Dx: GAD, Nightmare disorder; Alcohol use disorder severe in early remission  Plan: Continue alprazolam prn anxietyandclonazepam  0.5 mg prn sleep. Next appointment in three months. The plan was discussed with patient who had an opportunity to ask questions and these were all answered. I spend 25 minutes in phone consultation with the patient.     Stephanie Acre, MD 01/28/2019, 1:09 PM

## 2019-01-29 ENCOUNTER — Ambulatory Visit (HOSPITAL_COMMUNITY): Payer: 59 | Admitting: Licensed Clinical Social Worker

## 2019-01-29 ENCOUNTER — Other Ambulatory Visit: Payer: Self-pay

## 2019-02-12 ENCOUNTER — Other Ambulatory Visit: Payer: Self-pay

## 2019-02-12 ENCOUNTER — Ambulatory Visit (INDEPENDENT_AMBULATORY_CARE_PROVIDER_SITE_OTHER): Payer: 59 | Admitting: Licensed Clinical Social Worker

## 2019-02-12 DIAGNOSIS — F411 Generalized anxiety disorder: Secondary | ICD-10-CM

## 2019-02-12 DIAGNOSIS — F1021 Alcohol dependence, in remission: Secondary | ICD-10-CM

## 2019-02-19 ENCOUNTER — Ambulatory Visit (HOSPITAL_COMMUNITY): Payer: 59 | Admitting: Licensed Clinical Social Worker

## 2019-02-19 NOTE — Progress Notes (Signed)
Virtual Visit via Video Note  I connected with Lakeia Tonn on 02/12/2019 at  4:00 PM EDT by a video enabled telemedicine application and verified that I am speaking with the correct person using two identifiers.   I discussed the limitations of evaluation and management by telemedicine and the availability of in person appointments. The patient expressed understanding and agreed to proceed.  I discussed the assessment and treatment plan with the patient. The patient was provided an opportunity to ask questions and all were answered. The patient agreed with the plan and demonstrated an understanding of the instructions.   The patient was advised to call back or seek an in-person evaluation if the symptoms worsen or if the condition fails to improve as anticipated.  I provided 45 minutes of non-face-to-face time during this encounter.   Olegario Messier, LCSW    THERAPIST PROGRESS NOTE  Session Time: 4:15pm-5pm  Participation Level: Active  Behavioral Response: CasualAlertAnxious  Type of Therapy: Individual Therapy  Treatment Goals addressed: Anxiety, Coping and Diagnosis: Alcohol use disorder  Interventions: CBT, Motivational Interviewing and Supportive  Summary: Patricia Lutz is a 33 y.o. female who presents with alcohol use disorder and anxiety. Client shares about thoughts, feelings, and behaviors different from previous attempts at long term sobriety. Client struggled with developing discrepancies and accepting differences. Client was somewhat receptive to clinician challenging distorted thinking and excuses for relapse. Client shared she worried if provider knew her current ambivalence about receiving transplant she would no longer be considered, no matter her progress. Client agrees to maintain sobriety at this time, being reminded of PAWS symptoms and their nonpermanence.  Suicidal/Homicidal: Nowithout intent/plan  Therapist Response: Clinician checked in with client, assessing  for SI/HI/psychosis and overall level of functioning. Clinician and client processed thoughts about recent cravings compared to previous times she has attempted sobriety. Clinician and client processed barriers to feeling 'ready' if she needs a liver transplant and anxiety related to asking her provider questions for fear of provider 'giving up' on her. Clinician developed discrepancies with client about providers previous behavior in comparison to current fears. Clinician provided client with 5-7-9 breathing technique to help manage overwhelming feelings and completed activity in session with client. Clinician encouraged client to keep up with requests from other providers for follow up appointments or requirements.   Plan: Return again in 1-2 weeks.  Diagnosis: Axis I: Alcohol Abuse and Generalized Anxiety Disorder      Olegario Messier, LCSW 02/12/2019

## 2019-02-26 ENCOUNTER — Other Ambulatory Visit: Payer: Self-pay

## 2019-02-26 ENCOUNTER — Ambulatory Visit (INDEPENDENT_AMBULATORY_CARE_PROVIDER_SITE_OTHER): Payer: 59 | Admitting: Licensed Clinical Social Worker

## 2019-02-26 DIAGNOSIS — F1021 Alcohol dependence, in remission: Secondary | ICD-10-CM | POA: Diagnosis not present

## 2019-02-26 DIAGNOSIS — F411 Generalized anxiety disorder: Secondary | ICD-10-CM | POA: Diagnosis not present

## 2019-02-26 NOTE — Progress Notes (Signed)
Virtual Visit via Video Note  I connected with Patricia Lutz on 02/26/19 at  4:00 PM EDT by a video enabled telemedicine application and verified that I am speaking with the correct person using two identifiers.   I discussed the limitations of evaluation and management by telemedicine and the availability of in person appointments. The patient expressed understanding and agreed to proceed.  I discussed the assessment and treatment plan with the patient. The patient was provided an opportunity to ask questions and all were answered. The patient agreed with the plan and demonstrated an understanding of the instructions.   The patient was advised to call back or seek an in-person evaluation if the symptoms worsen or if the condition fails to improve as anticipated.  I provided 45 minutes of non-face-to-face time during this encounter.   Olegario Messier, LCSW    THERAPIST PROGRESS NOTE  Session Time: 4:05pm-5pm  Participation Level: Active  Behavioral Response: CasualAlertAnxious and Euthymic  Type of Therapy: Individual Therapy  Treatment Goals addressed: Coping and Diagnosis: Client will achieve and maintain sobriety 7/7 days per week. Client will use healthy coping skills at least 1xdaily at least 4xweekly  Interventions: CBT, Motivational Interviewing and Supportive  Summary: Patricia Lutz is a 33 y.o. female who presents with alcohol use disorder and generalized anxiety. Clinician processed with client the benefits of engaging with AA program and use of available support when triggered by husband bringing alcohol into the home. Client reports she feels supported and has increasing motivation to live and life will be 'worth it' sober. Clinician and client process thoughts and feelings related to levels of cravings and addressing 'addictive' thoughts and challenging with 'recovery' thoughts.    Suicidal/Homicidal: Nowithout intent/plan  Therapist Response: Clinician met with clint via  telehealth, assessing for SI/HI/psychosis and overall level of functioning, including substance abuse. Clinician processed with client providers response to concerns related to liver transplant and the wellbeing of her family. Clinician praised client for use of resources to address cravings. Clinician provided clients psycho-education on stages of change and ways to challenge craving thoughts.   Plan: Return again in 2 weeks.  Diagnosis: Axis I: Alcohol Abuse and Generalized Anxiety Disorder        Olegario Messier, LCSW 02/26/2019

## 2019-03-11 ENCOUNTER — Other Ambulatory Visit: Payer: Self-pay

## 2019-03-11 ENCOUNTER — Ambulatory Visit (INDEPENDENT_AMBULATORY_CARE_PROVIDER_SITE_OTHER): Payer: 59 | Admitting: Licensed Clinical Social Worker

## 2019-03-11 DIAGNOSIS — F411 Generalized anxiety disorder: Secondary | ICD-10-CM | POA: Diagnosis not present

## 2019-03-11 DIAGNOSIS — F1010 Alcohol abuse, uncomplicated: Secondary | ICD-10-CM | POA: Diagnosis not present

## 2019-03-17 NOTE — Progress Notes (Signed)
Virtual Visit via Video Note  I connected with Patricia Lutz on 03/11/2019 at  4:00 PM EDT by a video enabled telemedicine application and verified that I am speaking with the correct person using two identifiers.   I discussed the limitations of evaluation and management by telemedicine and the availability of in person appointments. The patient expressed understanding and agreed to proceed.  I discussed the assessment and treatment plan with the patient. The patient was provided an opportunity to ask questions and all were answered. The patient agreed with the plan and demonstrated an understanding of the instructions.   The patient was advised to call back or seek an in-person evaluation if the symptoms worsen or if the condition fails to improve as anticipated.  I provided 55 minutes of non-face-to-face time during this encounter.   Olegario Messier, LCSW    THERAPIST PROGRESS NOTE  Session Time: 4:05pm-5pm  Participation Level: Active  Behavioral Response: CasualAlertEuthymic  Type of Therapy: Individual Therapy  Treatment Goals addressed: Coping and Diagnosis: Achieve and maintain sobriety 7/7 days per week. Increase use of healthy coping skills to manage cravings and mental health symtpoms at least 1 time daily at least 4 days per week.  Interventions: Motivational Interviewing and Supportive  Summary: Patricia Lutz is a 32 y.o. female who presents with alcohol use disorder. Client reports 9 days 'sobriety' according to new app and her sponsor since she fully embraced Step One. Client reports not drinking for a longer period of time but agrees that sobriety started with acceptance of step 1. Client share the physical and mental changes she felt in a short time. Client was receptive to 'pink cloud' discussion. Client endorsed increased feelings of help and desire to continue to improve her life, which she reports she has not felt in some time. Client plans to continue engaging in  meetings and with her sponsor to maintain progress. Client reports decreased nightmares.   Suicidal/Homicidal: Nowithout intent/plan  Therapist Response: Clinician met with client via telehealth. Clinician assessed client for SI/HI/psychosis and overall level of functioning. Clinician processed with client changes in perspective and thought patterns related to substance use following engagement with sober community and AA. Clinician and client processed noticed triggers and 'addict behaviors' including lying about sobriety dates to therapist and avoiding getting blood work for liver doctor. Clinician provided ongoing psycho-education related to recovery process and provided supportive listening and reflective statements related to client progress.  Plan: Return again in 2 weeks.  Diagnosis: Axis I: Alcohol Abuse and Generalized Anxiety Disorder      Olegario Messier, LCSW 03/11/2019

## 2019-03-26 ENCOUNTER — Ambulatory Visit (INDEPENDENT_AMBULATORY_CARE_PROVIDER_SITE_OTHER): Payer: 59 | Admitting: Licensed Clinical Social Worker

## 2019-03-26 ENCOUNTER — Other Ambulatory Visit: Payer: Self-pay

## 2019-03-26 DIAGNOSIS — F1010 Alcohol abuse, uncomplicated: Secondary | ICD-10-CM | POA: Diagnosis not present

## 2019-03-26 DIAGNOSIS — F411 Generalized anxiety disorder: Secondary | ICD-10-CM

## 2019-03-26 NOTE — Progress Notes (Signed)
Virtual Visit via Telephone Note  I connected with Patricia Lutz on 03/26/19 at  4:00 PM EST by telephone and verified that I am speaking with the correct person using two identifiers.   I discussed the limitations, risks, security and privacy concerns of performing an evaluation and management service by telephone and the availability of in person appointments. I also discussed with the patient that there may be a patient responsible charge related to this service. The patient expressed understanding and agreed to proceed.  I discussed the assessment and treatment plan with the patient. The patient was provided an opportunity to ask questions and all were answered. The patient agreed with the plan and demonstrated an understanding of the instructions.   The patient was advised to call back or seek an in-person evaluation if the symptoms worsen or if the condition fails to improve as anticipated.  I provided 45 minutes of non-face-to-face time during this encounter.   Olegario Messier, LCSW    THERAPIST PROGRESS NOTE  Session Time: 4:05pm-5pm  Participation Level: Active  Behavioral Response: CasualAlertAnxious and Dysphoric  Type of Therapy: Individual Therapy  Treatment Goals addressed: Anxiety, Coping and Diagnosis: Client will achieve and maintain sobriety 7/7 days per week. Client will increase use of healthy coping skills to manage symtpoms of anxiety at least 1 time per day at least 5 days per week.  Interventions: CBT, Motivational Interviewing and Supportive  Summary: Patricia Lutz is a 33 y.o. female who presents with alcohol use disorder and generalized anxiety. Client shared frustration about other's lack of courtesy and behaviors about their drinking around her. Client reports this is particularly bothersome for those she has shared she was in recovery and continue to offer her alcohol or drink around her. Client shared about interaction with her mother, which she identified as  the most frustrating recently. Client is receptive to information about PAWS and explaination of chemicals in the brain which she reports providing her some hope for the future.  Suicidal/Homicidal: Nowithout intent/plan  Therapist Response: Clinician met with client via telehealth. Clinician and client processed frustrations with early sobriety. Clinician developed discripencties with client around desire for health and wants for alcoholic lifestyle. Clinician provided client with psycho-educational information on PAWS including symptoms and duration. Clinician reminded client of behaviors of mom relating to ACOAs.  Plan: Return again in 2 weeks.  Diagnosis: Axis I: Alcohol Abuse and Generalized Anxiety Disorder        Olegario Messier, LCSW 03/26/2019

## 2019-04-09 ENCOUNTER — Ambulatory Visit (INDEPENDENT_AMBULATORY_CARE_PROVIDER_SITE_OTHER): Payer: 59 | Admitting: Licensed Clinical Social Worker

## 2019-04-09 ENCOUNTER — Other Ambulatory Visit: Payer: Self-pay

## 2019-04-09 DIAGNOSIS — F1010 Alcohol abuse, uncomplicated: Secondary | ICD-10-CM

## 2019-04-09 DIAGNOSIS — F411 Generalized anxiety disorder: Secondary | ICD-10-CM | POA: Diagnosis not present

## 2019-04-09 NOTE — Progress Notes (Signed)
Virtual Visit via Telephone Note  I connected with Patricia Lutz on 04/09/2019 at  4:00 PM EST by telephone and verified that I am speaking with the correct person using two identifiers.   I discussed the limitations, risks, security and privacy concerns of performing an evaluation and management service by telephone and the availability of in person appointments. I also discussed with the patient that there may be a patient responsible charge related to this service. The patient expressed understanding and agreed to proceed.  I discussed the assessment and treatment plan with the patient. The patient was provided an opportunity to ask questions and all were answered. The patient agreed with the plan and demonstrated an understanding of the instructions.   The patient was advised to call back or seek an in-person evaluation if the symptoms worsen or if the condition fails to improve as anticipated.  I provided 55 minutes of non-face-to-face time during this encounter.   Olegario Messier, LCSW    THERAPIST PROGRESS NOTE  Session Time: 4:05pm-5:pm  Participation Level: Active  Behavioral Response: CasualAlertEuthymic  Type of Therapy: Individual Therapy  Treatment Goals addressed: Coping and Diagnosis: Client with achieve and maintain sobriety for 7/7 days. Client will increase use of healthy coping skills to at least 1 time per day at least 5 days to week to manage anxiety.  Interventions: CBT, Motivational Interviewing and Supportive  Summary: Patricia Lutz is a 33 y.o. female who presents with alcohol use disorder and symptoms of anxiety. Client reports meeting with doctor after hospital visit to discuss liver transplant due to worsening symptoms. Client reports she feels better this week about sobriety than last visit. Client reported the symptoms did last around less than 1 week but she now is proud at her ability to maintain sobriety despite PAWS symptoms. Client verbalized confidence  to complete process. Client processed frustration with perceived lack of consideration by others for her sobriety and feeling left out from activities which are alcohol centered.  Suicidal/Homicidal: Nowithout intent/plan  Therapist Response: Clinician checked in with client, assessing for SI/HI/psychosis and overall level of functioning including cravings and PAWs symptoms from previous session. Clinician inquired about results of doctor appointments and related thoughts and feelings. Clinician encouraged client to find a sponsor who meets her needs related to experience with transplant experiences.  Plan: Return again in 2 weeks.  Diagnosis: Axis I: Alcohol Abuse      Olegario Messier, LCSW 04/09/2019

## 2019-04-17 ENCOUNTER — Emergency Department (HOSPITAL_COMMUNITY)
Admission: EM | Admit: 2019-04-17 | Discharge: 2019-04-17 | Disposition: A | Discharge status: Home / Self Care | Payer: Managed Care, Other (non HMO) | Attending: Emergency Medicine | Admitting: Emergency Medicine

## 2019-04-17 ENCOUNTER — Emergency Department (HOSPITAL_COMMUNITY): Payer: Managed Care, Other (non HMO)

## 2019-04-17 ENCOUNTER — Other Ambulatory Visit: Payer: Self-pay

## 2019-04-17 ENCOUNTER — Encounter (HOSPITAL_COMMUNITY): Payer: Self-pay

## 2019-04-17 DIAGNOSIS — D539 Nutritional anemia, unspecified: Secondary | ICD-10-CM | POA: Insufficient documentation

## 2019-04-17 DIAGNOSIS — K7030 Alcoholic cirrhosis of liver without ascites: Secondary | ICD-10-CM | POA: Diagnosis not present

## 2019-04-17 DIAGNOSIS — E039 Hypothyroidism, unspecified: Secondary | ICD-10-CM | POA: Insufficient documentation

## 2019-04-17 DIAGNOSIS — R1011 Right upper quadrant pain: Secondary | ICD-10-CM | POA: Diagnosis not present

## 2019-04-17 DIAGNOSIS — Z79899 Other long term (current) drug therapy: Secondary | ICD-10-CM | POA: Diagnosis not present

## 2019-04-17 DIAGNOSIS — D732 Chronic congestive splenomegaly: Secondary | ICD-10-CM | POA: Insufficient documentation

## 2019-04-17 DIAGNOSIS — K821 Hydrops of gallbladder: Secondary | ICD-10-CM

## 2019-04-17 HISTORY — DX: Unspecified cirrhosis of liver: K74.60

## 2019-04-17 LAB — URINALYSIS, ROUTINE W REFLEX MICROSCOPIC
Bilirubin Urine: NEGATIVE
Glucose, UA: NEGATIVE mg/dL
Hgb urine dipstick: NEGATIVE
Ketones, ur: NEGATIVE mg/dL
Leukocytes,Ua: NEGATIVE
Nitrite: NEGATIVE
Protein, ur: NEGATIVE mg/dL
Specific Gravity, Urine: 1.01 (ref 1.005–1.030)
pH: 6.5 (ref 5.0–8.0)

## 2019-04-17 LAB — CBC WITH DIFFERENTIAL/PLATELET
Abs Immature Granulocytes: 0.01 10*3/uL (ref 0.00–0.07)
Basophils Absolute: 0 10*3/uL (ref 0.0–0.1)
Basophils Relative: 0 %
Eosinophils Absolute: 0.1 10*3/uL (ref 0.0–0.5)
Eosinophils Relative: 2 %
HCT: 26.8 % — ABNORMAL LOW (ref 36.0–46.0)
Hemoglobin: 9 g/dL — ABNORMAL LOW (ref 12.0–15.0)
Immature Granulocytes: 0 %
Lymphocytes Relative: 28 %
Lymphs Abs: 1.3 10*3/uL (ref 0.7–4.0)
MCH: 40.2 pg — ABNORMAL HIGH (ref 26.0–34.0)
MCHC: 33.6 g/dL (ref 30.0–36.0)
MCV: 119.6 fL — ABNORMAL HIGH (ref 80.0–100.0)
Monocytes Absolute: 0.3 10*3/uL (ref 0.1–1.0)
Monocytes Relative: 7 %
Neutro Abs: 2.9 10*3/uL (ref 1.7–7.7)
Neutrophils Relative %: 63 %
Platelets: 76 10*3/uL — ABNORMAL LOW (ref 150–400)
RBC: 2.24 MIL/uL — ABNORMAL LOW (ref 3.87–5.11)
RDW: 12.2 % (ref 11.5–15.5)
WBC: 4.6 10*3/uL (ref 4.0–10.5)
nRBC: 0 % (ref 0.0–0.2)

## 2019-04-17 LAB — COMPREHENSIVE METABOLIC PANEL
ALT: 43 U/L (ref 0–44)
AST: 43 U/L — ABNORMAL HIGH (ref 15–41)
Albumin: 3.8 g/dL (ref 3.5–5.0)
Alkaline Phosphatase: 138 U/L — ABNORMAL HIGH (ref 38–126)
Anion gap: 12 (ref 5–15)
BUN: 8 mg/dL (ref 6–20)
CO2: 22 mmol/L (ref 22–32)
Calcium: 9.4 mg/dL (ref 8.9–10.3)
Chloride: 100 mmol/L (ref 98–111)
Creatinine, Ser: 0.68 mg/dL (ref 0.44–1.00)
GFR calc Af Amer: >60 mL/min (ref >60)
GFR calc non Af Amer: >60 mL/min (ref >60)
Glucose, Bld: 113 mg/dL — ABNORMAL HIGH (ref 70–99)
Potassium: 3.4 mmol/L — ABNORMAL LOW (ref 3.5–5.1)
Sodium: 134 mmol/L — ABNORMAL LOW (ref 135–145)
Total Bilirubin: 15 mg/dL — ABNORMAL HIGH (ref 0.3–1.2)
Total Protein: 8.2 g/dL — ABNORMAL HIGH (ref 6.5–8.1)

## 2019-04-17 LAB — I-STAT BETA HCG BLOOD, ED (MC, WL, AP ONLY): I-stat hCG, quantitative: <5 m[IU]/mL (ref <5)

## 2019-04-17 LAB — LIPASE, BLOOD: Lipase: 51 U/L (ref 11–51)

## 2019-04-17 LAB — AMMONIA: Ammonia: 45 umol/L — ABNORMAL HIGH (ref 9–35)

## 2019-04-17 LAB — LACTIC ACID, PLASMA: Lactic Acid, Venous: 1.4 mmol/L (ref 0.5–1.9)

## 2019-04-17 MED ORDER — IOHEXOL 300 MG/ML  SOLN
100.0000 mL | Freq: Once | INTRAMUSCULAR | Status: AC | PRN
  Administered 2019-04-17: 04:00:00 100 mL via INTRAVENOUS

## 2019-04-17 MED ORDER — FENTANYL CITRATE (PF) 100 MCG/2ML IJ SOLN
50.0000 ug | Freq: Once | INTRAMUSCULAR | Status: AC
  Administered 2019-04-17: 50 ug via INTRAVENOUS
  Filled 2019-04-17: qty 2

## 2019-04-17 MED ORDER — ONDANSETRON HCL 4 MG/2ML IJ SOLN
4.0000 mg | Freq: Once | INTRAMUSCULAR | Status: AC
  Administered 2019-04-17: 03:00:00 4 mg via INTRAVENOUS
  Filled 2019-04-17: qty 2

## 2019-04-17 MED ORDER — OXYCODONE HCL 5 MG PO TABS: 5.0000 mg | ORAL_TABLET | Freq: Four times a day (QID) | ORAL | 0 refills | Status: DC | PRN

## 2019-04-17 MED ORDER — SODIUM CHLORIDE 0.9% FLUSH
3.0000 mL | Freq: Once | INTRAVENOUS | Status: AC
  Administered 2019-04-17: 3 mL via INTRAVENOUS

## 2019-04-17 MED ORDER — SODIUM CHLORIDE (PF) 0.9 % IJ SOLN
INTRAMUSCULAR | Status: AC
  Filled 2019-04-17: qty 50

## 2019-04-17 NOTE — ED Triage Notes (Signed)
Pt coming from home c/o abdominal pain that has gotten progressively worse over the last 3 days. N/V but no D. Hx of cirrhosis.

## 2019-04-17 NOTE — ED Notes (Signed)
Patient transported to CT 

## 2019-04-17 NOTE — Discharge Instructions (Addendum)
Please contact your [facility] team today and inform them that you had a CT scan and the right upper quadrant ultrasound which showed cirrhosis, splenomegaly, hydrops of the gallbladder and gallstones without definitive cholecystitis.  They should be able to access your results through Epic.

## 2019-04-17 NOTE — ED Provider Notes (Signed)
[facility] DEPT Provider Note: Georgena Spurling, MD, FACEP  CSN: [id number] MRN: [id number] ARRIVAL: 04/17/19 at [facility]: [place]  Abdominal Pain   HISTORY OF PRESENT ILLNESS  04/17/19 2:30 AM Amara Denardis is a 33 y.o. female with a history of alcoholic cirrhosis on the transplant list at [facility].  She is here with 3 days of right upper quadrant pain.  She rates the pain as a 9 out of 10.  It is worse with palpation and she is not able to find a comfortable position.  She describes the pain as pressure-like and constant.  She has had nausea and vomiting but states that his usual for her.  She has not had diarrhea.  She states she no longer drinks alcohol.   Past Medical History:  Diagnosis Date  . Anxiety   . Cirrhosis ([place])   . Depression   . GERD (gastroesophageal reflux disease)   . Hashimoto's disease   . History of stomach ulcers   . Hypothyroidism   . Substance abuse ([place])    alcohol    Past Surgical History:  Procedure Laterality Date  . CESAREAN SECTION      Family History  Problem Relation Age of Onset  . Crohn's disease Mother   . Crohn's disease Maternal Grandmother   . Diabetes Brother     Social History   Tobacco Use  . Smoking status: Never Smoker  . Smokeless tobacco: Never Used  Substance Use Topics  . Alcohol use: Not Currently    Alcohol/week: 0.0 standard drinks  . Drug use: No    Prior to Admission medications   Medication Sig Start Date End Date Taking? Authorizing Provider  ALPRAZolam (XANAX) 0.25 MG tablet Take 1 tablet (0.25 mg total) by mouth 2 (two) times daily as needed for sleep. Patient taking differently: Take 0.75 mg by mouth 3 (three) times daily as needed for anxiety or sleep.  01/28/19 04/28/19 Yes Pucilowski, Olgierd A, MD  ciprofloxacin (CIPRO) 500 MG tablet Take 500 mg by mouth daily.   Yes [provider]  furosemide (LASIX) 20 MG tablet Take 20 mg by mouth daily.  05/30/18 05/30/19 Yes [provider]  levothyroxine (SYNTHROID, LEVOTHROID) 125 MCG tablet Take 125 mcg by mouth daily before breakfast.    Yes [provider]  midodrine (PROAMATINE) 10 MG tablet Take 10 mg by mouth 3 (three) times daily.  06/26/18 06/26/19 Yes [provider]  pantoprazole (PROTONIX) 40 MG tablet Take 1 tablet (40 mg total) by mouth 2 (two) times daily. Patient taking differently: Take 40 mg by mouth daily.  05/24/16  Yes Jola Schmidt, MD  spironolactone (ALDACTONE) 50 MG tablet Take 100 mg by mouth daily.  05/30/18 05/30/19 Yes [provider]  clonazePAM (KLONOPIN) 0.5 MG tablet Take 1 tablet (0.5 mg total) by mouth at bedtime as needed (sleep). Patient not taking: Reported on 04/17/2019 11/28/18 02/26/19  Pucilowski, Marchia Bond, MD    Allergies Patient has no known allergies.   REVIEW OF SYSTEMS  Negative except as noted here or in the History of Present Illness.   PHYSICAL EXAMINATION  Initial Vital Signs Blood pressure 119/80, pulse 97, temperature 98.4 F (36.9 C), temperature source Oral, resp. rate 15, height 5\' 2"  (1.575 m), weight 59.9 kg, SpO2 100 %.  Examination General: Well-developed, well-nourished female in no acute distress; appearance consistent with age of record HENT: normocephalic; atraumatic Eyes: pupils equal, round and reactive to light; extraocular muscles intact; scleral  icterus Neck: supple Heart: regular rate and rhythm Lungs: clear to auscultation bilaterally Abdomen: soft; nondistended; no ascites; diffuse right upper quadrant tenderness without Murphy sign; no discernible hepatomegaly; bowel sounds present Extremities: No deformity; full range of motion; pulses normal Neurologic: Awake, alert and oriented; motor function intact in all extremities and symmetric; no facial droop Skin: Warm and dry; mild jaundice Psychiatric: Grimacing   RESULTS  Summary of this visit's results, reviewed and interpreted by myself:   EKG Interpretation   Date/Time:    Ventricular Rate:    PR Interval:    QRS Duration:   QT Interval:    QTC Calculation:   R Axis:     Text Interpretation:        Laboratory Studies: Results for orders placed or performed during the hospital encounter of 04/17/19 (from the past 24 hour(s))  Lipase, blood     Status: None   Collection Time: 04/17/19  2:49 AM  Result Value Ref Range   Lipase 51 11 - 51 U/L  Comprehensive metabolic panel     Status: Abnormal   Collection Time: 04/17/19  2:49 AM  Result Value Ref Range   Sodium 134 (L) 135 - 145 mmol/L   Potassium 3.4 (L) 3.5 - 5.1 mmol/L   Chloride 100 98 - 111 mmol/L   CO2 22 22 - 32 mmol/L   Glucose, Bld 113 (H) 70 - 99 mg/dL   BUN 8 6 - 20 mg/dL   Creatinine, Ser 0.68 0.44 - 1.00 mg/dL   Calcium 9.4 8.9 - 10.3 mg/dL   Total Protein 8.2 (H) 6.5 - 8.1 g/dL   Albumin 3.8 3.5 - 5.0 g/dL   AST 43 (H) 15 - 41 U/L   ALT 43 0 - 44 U/L   Alkaline Phosphatase 138 (H) 38 - 126 U/L   Total Bilirubin 15.0 (H) 0.3 - 1.2 mg/dL   GFR calc non Af Amer >60 >60 mL/min   GFR calc Af Amer >60 >60 mL/min   Anion gap 12 5 - 15  Urinalysis, Routine w reflex microscopic     Status: None   Collection Time: 04/17/19  2:49 AM  Result Value Ref Range   Color, Urine YELLOW YELLOW   APPearance CLEAR CLEAR   Specific Gravity, Urine 1.010 1.005 - 1.030   pH 6.5 5.0 - 8.0   Glucose, UA NEGATIVE NEGATIVE mg/dL   Hgb urine dipstick NEGATIVE NEGATIVE   Bilirubin Urine NEGATIVE NEGATIVE   Ketones, ur NEGATIVE NEGATIVE mg/dL   Protein, ur NEGATIVE NEGATIVE mg/dL   Nitrite NEGATIVE NEGATIVE   Leukocytes,Ua NEGATIVE NEGATIVE  CBC with Differential     Status: Abnormal   Collection Time: 04/17/19  2:49 AM  Result Value Ref Range   WBC 4.6 4.0 - 10.5 K/uL   RBC 2.24 (L) 3.87 - 5.11 MIL/uL   Hemoglobin 9.0 (L) 12.0 - 15.0 g/dL   HCT 26.8 (L) 36.0 - 46.0 %   MCV 119.6 (H) 80.0 - 100.0 fL   MCH 40.2 (H) 26.0 - 34.0 pg   MCHC 33.6 30.0 - 36.0 g/dL   RDW 12.2 11.5 - 15.5 %    Platelets 76 (L) 150 - 400 K/uL   nRBC 0.0 0.0 - 0.2 %   Neutrophils Relative % 63 %   Neutro Abs 2.9 1.7 - 7.7 K/uL   Lymphocytes Relative 28 %   Lymphs Abs 1.3 0.7 - 4.0 K/uL   Monocytes Relative 7 %   Monocytes Absolute 0.3 0.1 -  1.0 K/uL   Eosinophils Relative 2 %   Eosinophils Absolute 0.1 0.0 - 0.5 K/uL   Basophils Relative 0 %   Basophils Absolute 0.0 0.0 - 0.1 K/uL   Immature Granulocytes 0 %   Abs Immature Granulocytes 0.01 0.00 - 0.07 K/uL   Polychromasia PRESENT   Lactic acid, plasma     Status: None   Collection Time: 04/17/19  2:49 AM  Result Value Ref Range   Lactic Acid, Venous 1.4 0.5 - 1.9 mmol/L  Ammonia     Status: Abnormal   Collection Time: 04/17/19  2:49 AM  Result Value Ref Range   Ammonia 45 (H) 9 - 35 umol/L  I-Stat beta hCG blood, ED     Status: None   Collection Time: 04/17/19  3:03 AM  Result Value Ref Range   I-stat hCG, quantitative <5.0 <5 mIU/mL   Comment 3           Imaging Studies: Ct Abdomen Pelvis W Contrast  Result Date: 04/17/2019 CLINICAL DATA:  Increasing right upper quadrant pain for 3 days with nausea and vomiting. History of cirrhosis. EXAM: CT ABDOMEN AND PELVIS WITH CONTRAST TECHNIQUE: Multidetector CT imaging of the abdomen and pelvis was performed using the standard protocol following bolus administration of intravenous contrast. CONTRAST:  123mL OMNIPAQUE IOHEXOL 300 MG/ML  SOLN COMPARISON:  Abdominal ultrasound 11/13/2016. CT abdomen and pelvis 05/23/2016. FINDINGS: Lower chest: No consolidation or pleural effusion in the lung bases. Hepatobiliary: Marked hepatic steatosis on the prior CT is not evident today. Subcentimeter hypodensities in the liver are too small to fully characterize. The gallbladder is hydropic and contains multiple small stones and likely sludge without evidence of significant gallbladder wall thickening or pericholecystic inflammation. There is no biliary dilatation. Pancreas: Unremarkable. Spleen: Splenomegaly  with the spleen measuring 15 cm in AP length, new from the prior CT. Adrenals/Urinary Tract: Unremarkable adrenal glands. No evidence of renal mass, calculi, or hydronephrosis. Unremarkable bladder. Stomach/Bowel: The stomach is unremarkable. There is no evidence of bowel obstruction or inflammation. The appendix is unremarkable. Vascular/Lymphatic: Multiple varices including paraesophageal, gastric, and mesenteric varices. Normal caliber of the abdominal aorta. Patent superior mesenteric, portal, and splenic veins. No enlarged lymph nodes. Reproductive: IUD in place. No adnexal mass. Other: No ascites or pneumoperitoneum. Musculoskeletal: No acute osseous abnormality or suspicious osseous lesion. IMPRESSION: 1. Hydropic gallbladder containing multiple small gallstones. No definite evidence of cholecystitis by CT, however right upper quadrant abdominal ultrasound could be performed if this is a clinical concern. 2. Findings of portal hypertension including new splenomegaly and multiple varices. Electronically Signed   By: Logan Bores M.D.   On: 04/17/2019 04:31   US Abdomen Limited Ruq  Result Date: 04/17/2019 CLINICAL DATA:  Right upper quadrant pain, cirrhosis EXAM: ULTRASOUND ABDOMEN LIMITED RIGHT UPPER QUADRANT COMPARISON:  CT abdomen pelvis 04/17/2019 FINDINGS: Gallbladder: Gallbladder is mildly distended with echogenic sludge and few posteriorly shadowing echogenic gallstones largest measuring up to 1.3 cm in diameter. Sonographic Percell Miller sign is reportedly negative however. Common bile duct: Diameter: 5.5 mm, upper limits of normal. Liver: Mild hepatic heterogeneity. With increased echogenicity. No focal lesions. Portal vein is patent on color Doppler imaging with normal direction of blood flow towards the liver. Other: None. IMPRESSION: Mild gallbladder distention with biliary stones and sludge. Normal wall thickness and a negative sonographic Murphy sign. Findings remain equivocal for acute  cholecystitis. Correlate with clinical exam findings. The common bile duct is at the upper limits of normal for diameter. Mild hepatic  heterogeneity and echogenicity are compatible with reported history of cirrhosis. Electronically Signed   By: Lovena Le M.D.   On: 04/17/2019 06:50    ED COURSE and MDM  Nursing notes, initial and subsequent vitals signs, including pulse oximetry, reviewed and interpreted by myself.  Vitals:   04/17/19 0211 04/17/19 0300 04/17/19 0330 04/17/19 0504  BP: 119/80 110/69 (!) 112/59 (!) 100/59  Pulse: 97 93 95 89  Resp: 15 16  15   Temp: 98.4 F (36.9 C)     TempSrc: Oral     SpO2: 100% 100% 100% 100%  Weight: 59.9 kg     Height: 5\' 2"  (1.575 m)      Medications  sodium chloride (PF) 0.9 % injection (has no administration in time range)  sodium chloride flush (NS) 0.9 % injection 3 mL (3 mLs Intravenous Given 04/17/19 0304)  ondansetron (ZOFRAN) injection 4 mg (4 mg Intravenous Given 04/17/19 0303)  fentaNYL (SUBLIMAZE) injection 50 mcg (50 mcg Intravenous Given 04/17/19 0303)  iohexol (OMNIPAQUE) 300 MG/ML solution 100 mL (100 mLs Intravenous Contrast Given 04/17/19 0405)   7:01 AM Patient continues to have generalized right upper quadrant tenderness but without frank Murphy sign.  I suspect this represents hepatic tenderness due to her cirrhosis and vascular congestion.  As noted on the CT her spleen is significantly enlarged which is a change from previous study.  There is no definite evidence of cholecystitis on ultrasound or CT and clinically I have a low's index of suspicion for acute cholecystitis.  She does not have a leukocytosis or fever.  She would be a poor surgical candidate for cholecystectomy given her liver disease.  We will treat her pain and have her return if she develops a fever or worsening pain.  Otherwise she was advised to contact her gastroenterologist and/or transplant team at [facility] today and advised them of our findings.    PROCEDURES  Procedures   ED DIAGNOSES     ICD-10-CM   1. RUQ abdominal pain  R10.11 US Abdomen Limited RUQ    US Abdomen Limited RUQ  2. Alcoholic cirrhosis of liver without ascites (HCC)  K70.30   3. Congestive splenomegaly  D73.2   4. Macrocytic anemia  D53.9        Zacchaeus Halm, Jenny Reichmann, MD 04/17/19 [phone]

## 2019-04-28 ENCOUNTER — Other Ambulatory Visit: Payer: Self-pay

## 2019-04-28 ENCOUNTER — Ambulatory Visit (INDEPENDENT_AMBULATORY_CARE_PROVIDER_SITE_OTHER): Payer: 59 | Admitting: Psychiatry

## 2019-04-28 DIAGNOSIS — F411 Generalized anxiety disorder: Secondary | ICD-10-CM

## 2019-04-28 DIAGNOSIS — F1021 Alcohol dependence, in remission: Secondary | ICD-10-CM | POA: Diagnosis not present

## 2019-04-28 MED ORDER — ALPRAZOLAM 0.5 MG PO TABS: 0.5000 mg | ORAL_TABLET | Freq: Three times a day (TID) | ORAL | 2 refills | Status: DC | PRN

## 2019-04-28 NOTE — Progress Notes (Signed)
[place] MD/PA/NP OP Progress Note  04/28/2019 3:12 PM Patricia Lutz  MRN:  [id number] Interview was conducted by phone and I verified that I was speaking with the correct person using two identifiers. I discussed the limitations of evaluation and management by telemedicine and  the availability of in person appointments. Patient expressed understanding and agreed to proceed.  Chief Complaint: Anxiety.  HPI: 10yomarriedfemale with mixed anxiety disorder (GAD, panic disorder now improved) and night terrors which per her report triggered long standing alcohol drinking problem. She has liver cirrhosis and is waiting for a liver transplant(her case will be reviewed next week). In remission of alcohol use disorder since November 2019 (relepsed on her birthday when drank alcohol drink x 1 but has not used alcohol at all for past 6 months). She is AA connected ([employer] and talks to her sponsor often). She denies craving alcohol. Her husband appears to have some drinking problem but she denies that he brings alcohol home these days. She has tried and failed multiple SSRIs/SNRIs prescribed in the past for anxiety but admittedly was also heavily drinking at the time. She has been on benzodiazepines for over 15 years. She denied nightmares/flashbacks lately. She was taking a low dose of alprazolam bid for anxiety/insomnia but recently noticed that anxiety increased (related to delayed process of approval for liver transplant) and she started to take three tabs at a time. It works well for anxiety and does not cause sedation (in the past she also used it for sleep).    Visit Diagnosis:    ICD-10-CM   1. GAD (generalized anxiety disorder)  F41.1   2. Alcohol use disorder, severe, in early remission ([place])  F10.21     Past Psychiatric History: Please see intake H&P.  Past Medical History:  Past Medical History:  Diagnosis Date  . Anxiety   . Cirrhosis ([place])   . Depression   . GERD (gastroesophageal  reflux disease)   . Hashimoto's disease   . History of stomach ulcers   . Hypothyroidism   . Substance abuse ([place])    alcohol    Past Surgical History:  Procedure Laterality Date  . CESAREAN SECTION      Family Psychiatric History: None.  Family History:  Family History  Problem Relation Age of Onset  . Crohn's disease Mother   . Crohn's disease Maternal Grandmother   . Diabetes Brother     Social History:  Social History   Socioeconomic History  . Marital status: Unknown    Spouse name: Not on file  . Number of children: Not on file  . Years of education: Not on file  . Highest education level: Not on file  Occupational History  . Not on file  Social Needs  . Financial resource strain: Not on file  . Food insecurity    Worry: Not on file    Inability: Not on file  . Transportation needs    Medical: Not on file    Non-medical: Not on file  Tobacco Use  . Smoking status: Never Smoker  . Smokeless tobacco: Never Used  Substance and Sexual Activity  . Alcohol use: Not Currently    Alcohol/week: 0.0 standard drinks  . Drug use: No  . Sexual activity: Not on file  Lifestyle  . Physical activity    Days per week: Not on file    Minutes per session: Not on file  . Stress: Not on file  Relationships  . Social connections    Talks  on phone: Not on file    Gets together: Not on file    Attends religious service: Not on file    Active member of club or organization: Not on file    Attends meetings of clubs or organizations: Not on file    Relationship status: Not on file  Other Topics Concern  . Not on file  Social History Narrative  . Not on file    Allergies: No Known Allergies  Metabolic Disorder Labs: No results found for: HGBA1C, MPG No results found for: PROLACTIN No results found for: CHOL, TRIG, HDL, CHOLHDL, VLDL, LDLCALC No results found for: TSH  Therapeutic Level Labs: No results found for: LITHIUM No results found for: VALPROATE No  components found for:  CBMZ  Current Medications: Current Outpatient Medications  Medication Sig Dispense Refill  . ALPRAZolam (XANAX) 0.5 MG tablet Take 1 tablet (0.5 mg total) by mouth 3 (three) times daily as needed for anxiety or sleep. 90 tablet 2  . ciprofloxacin (CIPRO) 500 MG tablet Take 500 mg by mouth daily.    . furosemide (LASIX) 20 MG tablet Take 20 mg by mouth daily.     Marland Kitchen levothyroxine (SYNTHROID, LEVOTHROID) 125 MCG tablet Take 125 mcg by mouth daily before breakfast.     . midodrine (PROAMATINE) 10 MG tablet Take 10 mg by mouth 3 (three) times daily.     Marland Kitchen oxyCODONE (OXY IR/ROXICODONE) 5 MG immediate release tablet Take 1 tablet (5 mg total) by mouth every 6 (six) hours as needed for moderate pain or severe pain. 30 tablet 0  . pantoprazole (PROTONIX) 40 MG tablet Take 1 tablet (40 mg total) by mouth 2 (two) times daily. (Patient taking differently: Take 40 mg by mouth daily. ) 60 tablet 0  . spironolactone (ALDACTONE) 50 MG tablet Take 100 mg by mouth daily.      No current facility-administered medications for this visit.      Psychiatric Specialty Exam: Review of Systems  Constitutional: Positive for malaise/fatigue.  Gastrointestinal: Positive for abdominal pain.  Psychiatric/Behavioral: The patient is nervous/anxious.   All other systems reviewed and are negative.   There were no vitals taken for this visit.There is no height or weight on file to calculate BMI.  General Appearance: NA  Eye Contact:  NA  Speech:  Clear and Coherent and Normal Rate  Volume:  Normal  Mood:  Anxious  Affect:  NA  Thought Process:  Goal Directed and Linear  Orientation:  Full (Time, Place, and Person)  Thought Content: Logical   Suicidal Thoughts:  No  Homicidal Thoughts:  No  Memory:  Immediate;   Good Recent;   Good Remote;   Good  Judgement:  Fair  Insight:  Good  Psychomotor Activity:  NA  Concentration:  Concentration: Good  Recall:  Good  Fund of Knowledge: Good   Language: Good  Akathisia:  Negative  Handed:  Right  AIMS (if indicated): not done  Assets:  Communication Skills Desire for Improvement Housing Resilience Social Support  ADL's:  Intact  Cognition: WNL  Sleep:  Fair    Assessment and Plan: 33yomarriedfemale with mixed anxiety disorder (GAD, panic disorder now improved) and night terrors which per her report triggered long standing alcohol drinking problem. She has liver cirrhosis and is waiting for a liver transplant(her case will be reviewed next week). In remission of alcohol use disorder since November 2019 (relepsed on her birthday when drank alcohol drink x 1 but has not used alcohol at  all for past 6 months). She is AA connected ([employer] and talks to her sponsor often). She denies craving alcohol. Her husband appears to have some drinking problem but she denies that he brings alcohol home these days. She has tried and failed multiple SSRIs/SNRIs prescribed in the past for anxiety but admittedly was also heavily drinking at the time. She has been on benzodiazepines for over 15 years. She denied nightmares/flashbacks lately. She was taking a low dose of alprazolam bid for anxiety/insomnia but recently noticed that anxiety increased (related to delayed process of approval for liver transplant) and she started to take three tabs at a time. It works well for anxiety and does not cause sedation (in the past she also used it for sleep).    Dx: GAD, Nightmare disorder, resolved; Alcohol use disorder severe in early remission  Plan: Continue alprazolam prn anxiety/sleepbut increase dose to 0.5 mg.Next appointment in three months.The plan was discussed with patient who had an opportunity to ask questions and these were all answered. I spend25 minutes inphone consultation with the patient.    Stephanie Acre, MD 04/28/2019, 3:12 PM

## 2019-05-07 ENCOUNTER — Ambulatory Visit (INDEPENDENT_AMBULATORY_CARE_PROVIDER_SITE_OTHER): Payer: 59 | Admitting: Licensed Clinical Social Worker

## 2019-05-07 DIAGNOSIS — F1010 Alcohol abuse, uncomplicated: Secondary | ICD-10-CM

## 2019-05-07 DIAGNOSIS — F411 Generalized anxiety disorder: Secondary | ICD-10-CM | POA: Diagnosis not present

## 2019-05-08 ENCOUNTER — Other Ambulatory Visit: Payer: Self-pay

## 2019-05-11 ENCOUNTER — Other Ambulatory Visit: Payer: Self-pay

## 2019-05-11 ENCOUNTER — Ambulatory Visit (INDEPENDENT_AMBULATORY_CARE_PROVIDER_SITE_OTHER): Payer: 59 | Admitting: Psychiatry

## 2019-05-11 DIAGNOSIS — F411 Generalized anxiety disorder: Secondary | ICD-10-CM | POA: Diagnosis not present

## 2019-05-11 DIAGNOSIS — F1021 Alcohol dependence, in remission: Secondary | ICD-10-CM

## 2019-05-11 MED ORDER — METHYLPHENIDATE HCL 5 MG PO TABS: 5.0000 mg | ORAL_TABLET | Freq: Two times a day (BID) | ORAL | 0 refills | Status: DC

## 2019-05-11 NOTE — Progress Notes (Signed)
[place] MD/PA/NP OP Progress Note  05/11/2019 4:12 PM Patricia Lutz  MRN:  [id number] Interview was conducted by phone and I verified that I was speaking with the correct person using two identifiers. I discussed the limitations of evaluation and management by telemedicine and  the availability of in person appointments. Patient expressed understanding and agreed to proceed.  Chief Complaint: Fatigue.  HPI: 34yomarriedfemale with mixed anxiety disorder (GAD, panic disorder now improved) and night terrors which per her report triggered long standing alcohol drinking problem. She has liver cirrhosis and is waiting for a liver transplant(her case will be reviewed next week). In remission of alcohol use disorder since November 2019 (relepsed on her birthday when drank alcohol drink x 1 but has not used alcohol at all for past 6 months). She is AA connected ([employer] and talks to her sponsor often). She denies craving alcohol. Her husband appears to have some drinking problem but she denies that he brings alcohol home these days. She has tried and failed multiple SSRIs/SNRIs prescribed in the past for anxiety but admittedly was also heavily drinking at the time. She has been on benzodiazepines for over 15 years. She denied nightmares/flashbackslately. She was taking a low dose of alprazolam bid for anxiety/insomnia but recently noticed that anxiety increased (related to delayed process of approval for liver transplant) and she started to take three tabs at a time. It works well for anxiety and does not cause sedation (in the past she also used it for sleep).  Her major problem now is fatigue. She would like to try a stimulant eg methylphenidate to see if she will have some improvement in energy. This was cleared with her transplant team by consulting psychiatrist at [facility].  Visit Diagnosis:    ICD-10-CM   1. Alcohol use disorder, severe, in early remission ([place])  F10.21   2. GAD (generalized anxiety  disorder)  F41.1     Past Psychiatric History: Please see intake H&P.  Past Medical History:  Past Medical History:  Diagnosis Date  . Anxiety   . Cirrhosis ([place])   . Depression   . GERD (gastroesophageal reflux disease)   . Hashimoto's disease   . History of stomach ulcers   . Hypothyroidism   . Substance abuse ([place])    alcohol    Past Surgical History:  Procedure Laterality Date  . CESAREAN SECTION      Family Psychiatric History: None.  Family History:  Family History  Problem Relation Age of Onset  . Crohn's disease Mother   . Crohn's disease Maternal Grandmother   . Diabetes Brother     Social History:  Social History   Socioeconomic History  . Marital status: Unknown    Spouse name: Not on file  . Number of children: Not on file  . Years of education: Not on file  . Highest education level: Not on file  Occupational History  . Not on file  Tobacco Use  . Smoking status: Never Smoker  . Smokeless tobacco: Never Used  Substance and Sexual Activity  . Alcohol use: Not Currently    Alcohol/week: 0.0 standard drinks  . Drug use: No  . Sexual activity: Not on file  Other Topics Concern  . Not on file  Social History Narrative  . Not on file   Social Determinants of Health   Financial Resource Strain:   . Difficulty of Paying Living Expenses: Not on file  Food Insecurity:   . Worried About Charity fundraiser  in the Last Year: Not on file  . Ran Out of Food in the Last Year: Not on file  Transportation Needs:   . Lack of Transportation (Medical): Not on file  . Lack of Transportation (Non-Medical): Not on file  Physical Activity:   . Days of Exercise per Week: Not on file  . Minutes of Exercise per Session: Not on file  Stress:   . Feeling of Stress : Not on file  Social Connections:   . Frequency of Communication with Friends and Family: Not on file  . Frequency of Social Gatherings with Friends and Family: Not on file  . Attends Religious  Services: Not on file  . Active Member of Clubs or Organizations: Not on file  . Attends Archivist Meetings: Not on file  . Marital Status: Not on file    Allergies: No Known Allergies  Metabolic Disorder Labs: No results found for: HGBA1C, MPG No results found for: PROLACTIN No results found for: CHOL, TRIG, HDL, CHOLHDL, VLDL, LDLCALC No results found for: TSH  Therapeutic Level Labs: No results found for: LITHIUM No results found for: VALPROATE No components found for:  CBMZ  Current Medications: Current Outpatient Medications  Medication Sig Dispense Refill  . ALPRAZolam (XANAX) 0.5 MG tablet Take 1 tablet (0.5 mg total) by mouth 3 (three) times daily as needed for anxiety or sleep. 90 tablet 2  . ciprofloxacin (CIPRO) 500 MG tablet Take 500 mg by mouth daily.    . furosemide (LASIX) 20 MG tablet Take 20 mg by mouth daily.     Marland Kitchen levothyroxine (SYNTHROID, LEVOTHROID) 125 MCG tablet Take 125 mcg by mouth daily before breakfast.     . methylphenidate (RITALIN) 5 MG tablet Take 1 tablet (5 mg total) by mouth 2 (two) times daily with breakfast and lunch. 60 tablet 0  . midodrine (PROAMATINE) 10 MG tablet Take 10 mg by mouth 3 (three) times daily.     Marland Kitchen oxyCODONE (OXY IR/ROXICODONE) 5 MG immediate release tablet Take 1 tablet (5 mg total) by mouth every 6 (six) hours as needed for moderate pain or severe pain. 30 tablet 0  . pantoprazole (PROTONIX) 40 MG tablet Take 1 tablet (40 mg total) by mouth 2 (two) times daily. (Patient taking differently: Take 40 mg by mouth daily. ) 60 tablet 0  . spironolactone (ALDACTONE) 50 MG tablet Take 100 mg by mouth daily.      No current facility-administered medications for this visit.     Psychiatric Specialty Exam: Review of Systems  Constitutional: Positive for fatigue.  Psychiatric/Behavioral: The patient is nervous/anxious.   All other systems reviewed and are negative.   There were no vitals taken for this visit.There is no  height or weight on file to calculate BMI.  General Appearance: NA  Eye Contact:  NA  Speech:  Clear and Coherent and Normal Rate  Volume:  Normal  Mood:  Anxious  Affect:  NA  Thought Process:  Goal Directed and Linear  Orientation:  Full (Time, Place, and Person)  Thought Content: Logical   Suicidal Thoughts:  No  Homicidal Thoughts:  No  Memory:  Immediate;   Good Recent;   Good Remote;   Good  Judgement:  Good  Insight:  Good  Psychomotor Activity:  NA  Concentration:  Concentration: Good  Recall:  Good  Fund of Knowledge: Good  Language: Good  Akathisia:  Negative  Handed:  Right  AIMS (if indicated): not done  Assets:  Communication  Skills Desire for Improvement Financial Resources/Insurance Housing Resilience Social Support  ADL's:  Intact  Cognition: WNL  Sleep:  Good    Assessment and Plan: 33yomarriedfemale with mixed anxiety disorder (GAD, panic disorder now improved) and night terrors which per her report triggered long standing alcohol drinking problem. She has liver cirrhosis and is waiting for a liver transplant(her case will be reviewed next week). In remission of alcohol use disorder since November 2019 (relepsed on her birthday when drank alcohol drink x 1 but has not used alcohol at all for past 6 months). She is AA connected ([employer] and talks to her sponsor often). She denies craving alcohol. Her husband appears to have some drinking problem but she denies that he brings alcohol home these days. She has tried and failed multiple SSRIs/SNRIs prescribed in the past for anxiety but admittedly was also heavily drinking at the time. She has been on benzodiazepines for over 15 years. She denied nightmares/flashbackslately. She was taking a low dose of alprazolam bid for anxiety/insomnia but recently noticed that anxiety increased (related to delayed process of approval for liver transplant) and she started to take three tabs at a time. It works well for  anxiety and does not cause sedation (in the past she also used it for sleep).  Her major problem now is fatigue. She would like to try a stimulant eg methylphenidate to see if she will have some improvement in energy. This was cleared with her transplant team by consulting psychiatrist at [facility]  Dx: GAD, Nightmare disorder, resolved; Alcohol use disorder severe in early remission  Plan: Continue alprazolam 0.5 mg prn anxiety/sleep. Add methylphenidate 5 mg bid for fatigue. Next appointment in one month to discuss its effectiveness and discuss possible dose adjustment. The plan was discussed with patient who had an opportunity to ask questions and these were all answered. I spend15 minutes inphone consultation with the patient.   Stephanie Acre, MD 05/11/2019, 4:12 PM

## 2019-05-13 ENCOUNTER — Ambulatory Visit (HOSPITAL_COMMUNITY): Payer: 59 | Admitting: Psychiatry

## 2019-05-14 NOTE — Progress Notes (Signed)
Virtual Visit via Telephone Note  I connected with Ramani Senn on 05/14/19 at  4:00 PM EST by telephone and verified that I am speaking with the correct person using two identifiers.   I discussed the limitations, risks, security and privacy concerns of performing an evaluation and management service by telephone and the availability of in person appointments. I also discussed with the patient that there may be a patient responsible charge related to this service. The patient expressed understanding and agreed to proceed.  I discussed the assessment and treatment plan with the patient. The patient was provided an opportunity to ask questions and all were answered. The patient agreed with the plan and demonstrated an understanding of the instructions.   The patient was advised to call back or seek an in-person evaluation if the symptoms worsen or if the condition fails to improve as anticipated.  I provided 45 minutes of non-face-to-face time during this encounter.   Olegario Messier, LCSW    THERAPIST PROGRESS NOTE  Session Time: 4:10pm-5pm  Participation Level: Active  Behavioral Response: NAAlertAnxious and Euthymic  Type of Therapy: Individual Therapy  Treatment Goals addressed: Coping and Diagnosis: Achieve and maintain sobriety from all alcohol. Increase use of healthy coping skills to maintain stable mood at least 1 time daily  Interventions: CBT and Supportive  Summary: Leveta Deangelo is a 33 y.o. female who presents with alcohol use disorder and anxiety. Client shared experience with liver transplant team, specifically that she was not expecting to feel emotionally drained. Client verbalized progress toward goals of achieving and maintaining sobriety and being proud of stopping when she put her mind to it. Client processed previously being told the severity and only making passive, not helpful lifestyle changes. Client shared thoughts and feelings related to sobriety and lack of  overall support seen at neighborhood party. Client reports she is able to identify benefits to being sober and remember then even    Suicidal/Homicidal: Nowithout intent/plan  Therapist Response: Clinician processed with client experience with liver transplant process. Clinician and client discussed experience being one of the only sober adults at a party and was to support sobriety in high risk settings. Clinician praised client progress and discussed future goals for treatment.  Plan: Return again in 2 weeks.  Diagnosis: Axis I: Alcohol Abuse     Olegario Messier, LCSW 05/07/2019

## 2019-05-21 ENCOUNTER — Other Ambulatory Visit: Payer: Self-pay

## 2019-05-21 ENCOUNTER — Ambulatory Visit (INDEPENDENT_AMBULATORY_CARE_PROVIDER_SITE_OTHER): Payer: 59 | Admitting: Licensed Clinical Social Worker

## 2019-05-21 DIAGNOSIS — F1021 Alcohol dependence, in remission: Secondary | ICD-10-CM

## 2019-05-21 DIAGNOSIS — F411 Generalized anxiety disorder: Secondary | ICD-10-CM | POA: Diagnosis not present

## 2019-05-21 NOTE — Progress Notes (Signed)
Virtual Visit via Video Note  I connected with Patricia Lutz on 12/31/200 at  4:00 PM EST by a video enabled telemedicine application and verified that I am speaking with the correct person using two identifiers.   I discussed the limitations of evaluation and management by telemedicine and the availability of in person appointments. The patient expressed understanding and agreed to proceed.  I discussed the assessment and treatment plan with the patient. The patient was provided an opportunity to ask questions and all were answered. The patient agreed with the plan and demonstrated an understanding of the instructions.   The patient was advised to call back or seek an in-person evaluation if the symptoms worsen or if the condition fails to improve as anticipated.  I provided 45 minutes of non-face-to-face time during this encounter.   Olegario Messier, LCSW    THERAPIST PROGRESS NOTE  Session Time: 4:15pm-5pm  Participation Level: Active  Behavioral Response: CasualAlertEuthymic  Type of Therapy: Individual Therapy  Treatment Goals addressed: Coping and Diagnosis: Achieve and maintain sobriety 7/7 days per week.  Interventions: CBT and Motivational Interviewing  Summary: Patricia Lutz is a 33 y.o. female who presents with alcohol use disorder. Client shares thoughts and feelings about results from initial and secondary meeting of liver transplant team. Client reviews plan for maintaining support following surgery. Client processes change in thinking about level of worry of having surgery completed and recovery time vs staying alive and healthy for her children. Client appears to maintain desire to engage in long term sobriety, however does report decrease in active AA and sponsor engagement.   Suicidal/Homicidal: Nowithout intent/plan  Therapist Response: processed beoing placed on the transplant list after initial denial. Clinician processes with client changes in thoughts and  feelings relating to transplant process and level of fear vs acceptance. Clinician and client plan for long term support of sobriety following surgery and possible relapse thoughts from self or others. Clinician praised client on use of increased knowledge of addiction related behaviors to monitor own behavoirs, particularly around family members and friends who minimize the need for maintaining recovery even after transplant.  Plan: Return again in 2 weeks.  Diagnosis: Axis I: Alcohol Abuse and Anxiety Disorder NOS      Olegario Messier, LCSW 05/21/2019

## 2019-06-01 ENCOUNTER — Telehealth (HOSPITAL_COMMUNITY): Payer: Self-pay | Admitting: Licensed Clinical Social Worker

## 2019-06-01 NOTE — Telephone Encounter (Signed)
See attached

## 2019-06-04 ENCOUNTER — Other Ambulatory Visit: Payer: Self-pay

## 2019-06-04 ENCOUNTER — Ambulatory Visit (INDEPENDENT_AMBULATORY_CARE_PROVIDER_SITE_OTHER): Payer: 59 | Admitting: Licensed Clinical Social Worker

## 2019-06-04 DIAGNOSIS — F1010 Alcohol abuse, uncomplicated: Secondary | ICD-10-CM

## 2019-06-04 DIAGNOSIS — F411 Generalized anxiety disorder: Secondary | ICD-10-CM

## 2019-06-04 NOTE — Progress Notes (Signed)
Virtual Visit via Telephone Note  I connected with Patricia Lutz on 06/04/19 at  4:00 PM EST by telephone in her home and verified that I am speaking with the correct person using two identifiers.   I discussed the limitations, risks, security and privacy concerns of performing an evaluation and management service by telephone and the availability of in person appointments. I also discussed with the patient that there may be a patient responsible charge related to this service. The patient expressed understanding and agreed to proceed.   I discussed the assessment and treatment plan with the patient. The patient was provided an opportunity to ask questions and all were answered. The patient agreed with the plan and demonstrated an understanding of the instructions.   The patient was advised to call back or seek an in-person evaluation if the symptoms worsen or if the condition fails to improve as anticipated.  I provided 45 minutes of non-face-to-face time during this encounter.   Olegario Messier, LCSW    THERAPIST PROGRESS NOTE  Session Time: 4:10p-5pm  Participation Level: Active  Behavioral Response: NAAlertAnxious and Dysphoric  Type of Therapy: Individual Therapy  Treatment Goals addressed: Anxiety, Coping and Diagnosis: Increase use of helathy coping skills to at least 1xdaily at least4days per wk to maintain stable mood. Achieve and maintain sobriety 7/7 days per week.  Interventions: CBT and Supportive  Summary: Patricia Lutz is a 34 y.o. female who presents with alcohol use disorder and anxiety. Client reports increased anxiety since last minute cancellation of transplante due to covid test and feeling un-important/glossed over by staff at other offices. Client is able to identify distorted thinking and create alternative thoughts. Client is able to identify specific thoughts and feelings. Client states taking increased amount of anxiety medication, however only up to the level  prescribed to help with sleep and racing thoughts. Client discussed some grief, linking feeling of treatment at transplant facility similar to a previous miscarriage.  Suicidal/Homicidal: Nowithout intent/plan  Therapist Response: Clinician met with client via telehealth. Client was in her home at the time of appointment. Clinician assessed for SI/HI/psychosis and overall level of functioning. Clinician processed with client stopping process immediately prior to getting transplant. Clinician validated client feelings. Clinician praised client use of skills personally and psycho-ed knowledge to support others.  Plan: Return again in 1-2 weeks.  Diagnosis: Axis I: Alcohol Abuse and Anxiety Disorder NOS        Olegario Messier, LCSW 06/04/2019

## 2019-06-11 ENCOUNTER — Ambulatory Visit (INDEPENDENT_AMBULATORY_CARE_PROVIDER_SITE_OTHER): Payer: 59 | Admitting: Psychiatry

## 2019-06-11 ENCOUNTER — Other Ambulatory Visit: Payer: Self-pay

## 2019-06-11 DIAGNOSIS — F411 Generalized anxiety disorder: Secondary | ICD-10-CM

## 2019-06-11 DIAGNOSIS — F1021 Alcohol dependence, in remission: Secondary | ICD-10-CM | POA: Diagnosis not present

## 2019-06-11 MED ORDER — METHYLPHENIDATE HCL 5 MG PO TABS: 5.0000 mg | ORAL_TABLET | Freq: Two times a day (BID) | ORAL | 0 refills | Status: DC

## 2019-06-11 MED ORDER — ESZOPICLONE 1 MG PO TABS: 1.0000 mg | ORAL_TABLET | Freq: Every evening | ORAL | 1 refills | Status: DC | PRN

## 2019-06-11 NOTE — Progress Notes (Signed)
[place] MD/PA/NP OP Progress Note  06/11/2019 3:14 PM Patricia Lutz  MRN:  [id number] Interview was conducted by phone and I verified that I was speaking with the correct person using two identifiers. I discussed the limitations of evaluation and management by telemedicine and  the availability of in person appointments. Patient expressed understanding and agreed to proceed.  Chief Complaint: Fatigue, insomnia.  HPI: 21yomarriedfemale with mixed anxiety disorder (GAD, panic disorder now improved) and night terrors which per her report triggered long standing alcohol drinking problem. She has liver cirrhosis and is waiting for a liver transplant. Decision was delayed because she tested positive for COVID (no symptoms and her husband tested negative). Her case will be reviewednext week and she hopes she may be able to get a transplant the following week (providing there is a donor). 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 She denies craving alcohol. Her husband appears to have some drinking problem but she denies that he brings alcohol home these days. She has tried and failedmultiple SSRIs/SNRIs prescribed in the past for anxiety but admittedly was also heavily drinking at the time. She has been on benzodiazepines for over 15 years. She denied nightmares/flashbackslately. Shewas taking alow dose of alprazolam bidfor anxiety/insomnia but recently noticed that anxiety increased (related to delayed process of approval for liver transplant) and she started to take three tabs at a time. It works well for anxiety and does not cause sedation (in the past she also used it for sleep).Her major problem now is fatigue. She would like to try a stimulant eg methylphenidate to see if she will have some improvement in energy. This  was cleared with her transplant team by consulting psychiatrist at [facility]. We added 5 mg of Ritalin bid l;ast month. Her energy both mental and physical improved but she started to have increased problems with initial insomnia. She then tried just an AM dose and the effect seems to last till about 2 PM. We decided therefore that Patricia Lutz wil generally take just AM dose and use the second dose on prn/occasional basis. She started back taking Lunesta 1 mg with alprazolam at HS and her sleep returned to normal.   Visit Diagnosis:    ICD-10-CM   1. GAD (generalized anxiety disorder)  F41.1   2. Alcohol use disorder, severe, in early remission ([place])  F10.21     Past Psychiatric History: Please see intake H&P.  Past Medical History:  Past Medical History:  Diagnosis Date  . Anxiety   . Cirrhosis ([place])   . Depression   . GERD (gastroesophageal reflux disease)   . Hashimoto's disease   . History of stomach ulcers   . Hypothyroidism   . Substance abuse ([place])    alcohol    Past Surgical History:  Procedure Laterality Date  . CESAREAN SECTION      Family Psychiatric History: None.  Family History:  Family History  Problem Relation Age of Onset  . Crohn's disease Mother   . Crohn's disease Maternal Grandmother   . Diabetes Brother     Social History:  Social History   Socioeconomic History  . Marital status: Unknown    Spouse name: Not on file  . Number of children: Not on file  . Years of education: Not on file  . Highest education level:  Not on file  Occupational History  . Not on file  Tobacco Use  . Smoking status: Never Smoker  . Smokeless tobacco: Never Used  Substance and Sexual Activity  . Alcohol use: Not Currently    Alcohol/week: 0.0 standard drinks  . Drug use: No  . Sexual activity: Not on file  Other Topics Concern  . Not on file  Social History Narrative  . Not on file   Social Determinants of Health   Financial Resource Strain:   . Difficulty of Paying  Living Expenses: Not on file  Food Insecurity:   . Worried About Charity fundraiser in the Last Year: Not on file  . Ran Out of Food in the Last Year: Not on file  Transportation Needs:   . Lack of Transportation (Medical): Not on file  . Lack of Transportation (Non-Medical): Not on file  Physical Activity:   . Days of Exercise per Week: Not on file  . Minutes of Exercise per Session: Not on file  Stress:   . Feeling of Stress : Not on file  Social Connections:   . Frequency of Communication with Friends and Family: Not on file  . Frequency of Social Gatherings with Friends and Family: Not on file  . Attends Religious Services: Not on file  . Active Member of Clubs or Organizations: Not on file  . Attends Archivist Meetings: Not on file  . Marital Status: Not on file    Allergies: No Known Allergies  Metabolic Disorder Labs: No results found for: HGBA1C, MPG No results found for: PROLACTIN No results found for: CHOL, TRIG, HDL, CHOLHDL, VLDL, LDLCALC No results found for: TSH  Therapeutic Level Labs: No results found for: LITHIUM No results found for: VALPROATE No components found for:  CBMZ  Current Medications: Current Outpatient Medications  Medication Sig Dispense Refill  . ALPRAZolam (XANAX) 0.5 MG tablet Take 1 tablet (0.5 mg total) by mouth 3 (three) times daily as needed for anxiety or sleep. 90 tablet 2  . ciprofloxacin (CIPRO) 500 MG tablet Take 500 mg by mouth daily.    . eszopiclone (LUNESTA) 1 MG TABS tablet Take 1 tablet (1 mg total) by mouth at bedtime as needed for sleep. Take immediately before bedtime 30 tablet 1  . furosemide (LASIX) 20 MG tablet Take 20 mg by mouth daily.     Marland Kitchen levothyroxine (SYNTHROID, LEVOTHROID) 125 MCG tablet Take 125 mcg by mouth daily before breakfast.     . methylphenidate (RITALIN) 5 MG tablet Take 1 tablet (5 mg total) by mouth 2 (two) times daily with breakfast and lunch. 60 tablet 0  . midodrine (PROAMATINE) 10 MG  tablet Take 10 mg by mouth 3 (three) times daily.     Marland Kitchen oxyCODONE (OXY IR/ROXICODONE) 5 MG immediate release tablet Take 1 tablet (5 mg total) by mouth every 6 (six) hours as needed for moderate pain or severe pain. 30 tablet 0  . pantoprazole (PROTONIX) 40 MG tablet Take 1 tablet (40 mg total) by mouth 2 (two) times daily. (Patient taking differently: Take 40 mg by mouth daily. ) 60 tablet 0  . spironolactone (ALDACTONE) 50 MG tablet Take 100 mg by mouth daily.      No current facility-administered medications for this visit.     Psychiatric Specialty Exam: Review of Systems  Constitutional: Positive for fatigue.  Psychiatric/Behavioral: Positive for sleep disturbance.  All other systems reviewed and are negative.   There were no vitals taken for  this visit.There is no height or weight on file to calculate BMI.  General Appearance: NA  Eye Contact:  NA  Speech:  Clear and Coherent and Normal Rate  Volume:  Normal  Mood:  Euthymic  Affect:  NA  Thought Process:  Goal Directed and Linear  Orientation:  Full (Time, Place, and Person)  Thought Content: Logical   Suicidal Thoughts:  No  Homicidal Thoughts:  No  Memory:  Immediate;   Good Recent;   Good Remote;   Good  Judgement:  Good  Insight:  Good  Psychomotor Activity:  NA  Concentration:  Concentration: Good  Recall:  Good  Fund of Knowledge: Good  Language: Good  Akathisia:  Negative  Handed:  Right  AIMS (if indicated): not done  Assets:  Communication Skills Desire for Improvement Financial Resources/Insurance Housing Social Support  ADL's:  Intact  Cognition: WNL  Sleep:  Fair    Assessment and Plan: 33yomarriedfemale with mixed anxiety disorder (GAD, panic disorder now improved) and night terrors which per her report triggered long standing alcohol drinking problem. She has liver cirrhosis and is waiting for a liver transplant. Decision was delayed because she tested positive for COVID (no symptoms and her  husband tested negative). Her case will be reviewednext week and she hopes she may be able to get a transplant the following week (providing there is a donor). 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 She denies craving alcohol. Her husband appears to have some drinking problem but she denies that he brings alcohol home these days. She has tried and failedmultiple SSRIs/SNRIs prescribed in the past for anxiety but admittedly was also heavily drinking at the time. She has been on benzodiazepines for over 15 years. She denied nightmares/flashbackslately. Shewas taking alow dose of alprazolam bidfor anxiety/insomnia but recently noticed that anxiety increased (related to delayed process of approval for liver transplant) and she started to take three tabs at a time. It works well for anxiety and does not cause sedation (in the past she also used it for sleep).Her major problem now is fatigue. She would like to try a stimulant eg methylphenidate to see if she will have some improvement in energy. This was cleared with her transplant team by consulting psychiatrist at [facility]. We added 5 mg of Ritalin bid l;ast month. Her energy both mental and physical improved but she started to have increased problems with initial insomnia. She then tried just an AM dose and the effect seems to last till about 2 PM. We decided therefore that Patricia Lutz wil generally take just AM dose and use the second dose on prn/occasional basis. She started back taking Lunesta 1 mg with alprazolam at HS and her sleep returned to normal.   Dx: GAD, Nightmare disorder, resolved; Alcohol use disorder severe in early remission  Plan: Continue alprazolam 0.5 mg prn anxiety/sleep, eszopiclone 1 mg prn sleep and methylphenidate 5 mg q AM (or bid) for fatigue.  Next appointment in two months.. The plan was discussed with patient who had an opportunity to ask questions and these were all answered. I spend25 minutes inphone consultation with the patient.    Stephanie Acre, MD 06/11/2019, 3:14 PM

## 2019-06-17 ENCOUNTER — Other Ambulatory Visit: Payer: Self-pay

## 2019-06-17 ENCOUNTER — Ambulatory Visit (INDEPENDENT_AMBULATORY_CARE_PROVIDER_SITE_OTHER): Payer: 59 | Admitting: Licensed Clinical Social Worker

## 2019-06-17 DIAGNOSIS — F1010 Alcohol abuse, uncomplicated: Secondary | ICD-10-CM

## 2019-06-17 DIAGNOSIS — F411 Generalized anxiety disorder: Secondary | ICD-10-CM | POA: Diagnosis not present

## 2019-06-18 NOTE — Progress Notes (Signed)
Virtual Visit via Video Note  I connected with Patricia Lutz on 06/17/2019 at  4:00 PM EST by a video enabled telemedicine application and verified that I am speaking with the correct person using two identifiers.   I discussed the limitations of evaluation and management by telemedicine and the availability of in person appointments. The patient expressed understanding and agreed to proceed.  I discussed the assessment and treatment plan with the patient. The patient was provided an opportunity to ask questions and all were answered. The patient agreed with the plan and demonstrated an understanding of the instructions.   The patient was advised to call back or seek an in-person evaluation if the symptoms worsen or if the condition fails to improve as anticipated.  I provided 45 minutes of non-face-to-face time during this encounter.    A , LCSW    THERAPIST PROGRESS NOTE  Session Time: 4:10pm-4:55pm  Participation Level: Active  Behavioral Response: CasualAlertEuthymic  Type of Therapy: Individual Therapy  Treatment Goals addressed: Anxiety, Coping and Diagnosis: Client will achieve and maintain sobriety daily. Client will use healthy coping skills to maintain mood without substances at least 1xper day at least 5days  per week  Interventions: CBT  Summary: Patricia Lutz is a 33 y.o. female who presents with alcohol abuse and anxiety. Client shares changes in energy levels and frustration with trying to fill her time. Client shares working on figuring out who she is and what she likes to do as '[name].' Client verbalized understanding of changing brain development based on substance use. Client verbalized recent frustration with mothers contradictory words and behaviors however acknowledges this is a topic that needs to be accepted as it is out of her control and unlikely to change.  Suicidal/Homicidal: Nowithout intent/plan  Therapist Response: Clinician met with  client via telehealth in her home, assessing for SI/HI/psychosis and overall level of functioning. Clinician processed with client anxiety about waiting time for transplant. Clinician processed with client recovery process for alcohol abuse and the common triggers for relapse in early recovery. Clinician and client began to look at resentments related to mother's impact on her life.  Plan: Return again in 2 weeks.  Diagnosis: Axis I: Alcohol Abuse and Generalized Anxiety Disorder        A , LCSW 06/17/2019 

## 2019-06-21 ENCOUNTER — Emergency Department (HOSPITAL_COMMUNITY): Payer: Managed Care, Other (non HMO)

## 2019-06-21 ENCOUNTER — Emergency Department (HOSPITAL_COMMUNITY)
Admission: EM | Admit: 2019-06-21 | Discharge: 2019-06-21 | Disposition: A | Discharge status: Home / Self Care | Payer: Managed Care, Other (non HMO) | Attending: Emergency Medicine | Admitting: Emergency Medicine

## 2019-06-21 ENCOUNTER — Other Ambulatory Visit: Payer: Self-pay

## 2019-06-21 ENCOUNTER — Encounter (HOSPITAL_COMMUNITY): Payer: Self-pay

## 2019-06-21 DIAGNOSIS — R791 Abnormal coagulation profile: Secondary | ICD-10-CM | POA: Diagnosis not present

## 2019-06-21 DIAGNOSIS — E039 Hypothyroidism, unspecified: Secondary | ICD-10-CM | POA: Diagnosis not present

## 2019-06-21 DIAGNOSIS — Z79899 Other long term (current) drug therapy: Secondary | ICD-10-CM | POA: Diagnosis not present

## 2019-06-21 DIAGNOSIS — R1011 Right upper quadrant pain: Secondary | ICD-10-CM

## 2019-06-21 DIAGNOSIS — U071 COVID-19: Secondary | ICD-10-CM

## 2019-06-21 DIAGNOSIS — K7030 Alcoholic cirrhosis of liver without ascites: Secondary | ICD-10-CM

## 2019-06-21 LAB — COMPREHENSIVE METABOLIC PANEL
ALT: 47 U/L — ABNORMAL HIGH (ref 0–44)
AST: 52 U/L — ABNORMAL HIGH (ref 15–41)
Albumin: 3.6 g/dL (ref 3.5–5.0)
Alkaline Phosphatase: 116 U/L (ref 38–126)
Anion gap: 11 (ref 5–15)
BUN: 9 mg/dL (ref 6–20)
CO2: 24 mmol/L (ref 22–32)
Calcium: 9.1 mg/dL (ref 8.9–10.3)
Chloride: 97 mmol/L — ABNORMAL LOW (ref 98–111)
Creatinine, Ser: 0.68 mg/dL (ref 0.44–1.00)
GFR calc Af Amer: >60 mL/min (ref >60)
GFR calc non Af Amer: >60 mL/min (ref >60)
Glucose, Bld: 91 mg/dL (ref 70–99)
Potassium: 4 mmol/L (ref 3.5–5.1)
Sodium: 132 mmol/L — ABNORMAL LOW (ref 135–145)
Total Bilirubin: 14.4 mg/dL — ABNORMAL HIGH (ref 0.3–1.2)
Total Protein: 7.2 g/dL (ref 6.5–8.1)

## 2019-06-21 LAB — CBC WITH DIFFERENTIAL/PLATELET
Abs Immature Granulocytes: 0.01 K/uL (ref 0.00–0.07)
Basophils Absolute: 0 K/uL (ref 0.0–0.1)
Basophils Relative: 0 %
Eosinophils Absolute: 0 K/uL (ref 0.0–0.5)
Eosinophils Relative: 1 %
HCT: 29.2 % — ABNORMAL LOW (ref 36.0–46.0)
Hemoglobin: 9.9 g/dL — ABNORMAL LOW (ref 12.0–15.0)
Immature Granulocytes: 0 %
Lymphocytes Relative: 20 %
Lymphs Abs: 1 K/uL (ref 0.7–4.0)
MCH: 39.9 pg — ABNORMAL HIGH (ref 26.0–34.0)
MCHC: 33.9 g/dL (ref 30.0–36.0)
MCV: 117.7 fL — ABNORMAL HIGH (ref 80.0–100.0)
Monocytes Absolute: 0.3 K/uL (ref 0.1–1.0)
Monocytes Relative: 6 %
Neutro Abs: 3.9 K/uL (ref 1.7–7.7)
Neutrophils Relative %: 73 %
Platelets: 72 K/uL — ABNORMAL LOW (ref 150–400)
RBC: 2.48 MIL/uL — ABNORMAL LOW (ref 3.87–5.11)
RDW: 12.8 % (ref 11.5–15.5)
WBC: 5.3 K/uL (ref 4.0–10.5)
nRBC: 0 % (ref 0.0–0.2)

## 2019-06-21 LAB — APTT: aPTT: 43 seconds — ABNORMAL HIGH (ref 24–36)

## 2019-06-21 LAB — URINALYSIS, ROUTINE W REFLEX MICROSCOPIC
Bilirubin Urine: NEGATIVE
Glucose, UA: NEGATIVE mg/dL
Hgb urine dipstick: NEGATIVE
Ketones, ur: NEGATIVE mg/dL
Leukocytes,Ua: NEGATIVE
Nitrite: NEGATIVE
Protein, ur: NEGATIVE mg/dL
Specific Gravity, Urine: 1.003 — ABNORMAL LOW (ref 1.005–1.030)
pH: 7 (ref 5.0–8.0)

## 2019-06-21 LAB — PROTIME-INR
INR: 2.4 — ABNORMAL HIGH (ref 0.8–1.2)
Prothrombin Time: 25.9 seconds — ABNORMAL HIGH (ref 11.4–15.2)

## 2019-06-21 LAB — I-STAT BETA HCG BLOOD, ED (MC, WL, AP ONLY): I-stat hCG, quantitative: <5 m[IU]/mL (ref <5)

## 2019-06-21 LAB — RESPIRATORY PANEL BY RT PCR (FLU A&B, COVID)
Influenza A by PCR: NEGATIVE
Influenza B by PCR: NEGATIVE
SARS Coronavirus 2 by RT PCR: POSITIVE — AB

## 2019-06-21 LAB — LIPASE, BLOOD: Lipase: 33 U/L (ref 11–51)

## 2019-06-21 LAB — AMMONIA: Ammonia: 24 umol/L (ref 9–35)

## 2019-06-21 MED ORDER — ALBUMIN HUMAN 25 % IV SOLN
50.0000 g | Freq: Once | INTRAVENOUS | Status: AC
  Administered 2019-06-21: 25 g via INTRAVENOUS
  Filled 2019-06-21 (×3): qty 200

## 2019-06-21 MED ORDER — IOHEXOL 300 MG/ML  SOLN
100.0000 mL | Freq: Once | INTRAMUSCULAR | Status: AC | PRN
  Administered 2019-06-21: 100 mL via INTRAVENOUS

## 2019-06-21 MED ORDER — SODIUM CHLORIDE 0.9 % IV BOLUS: 500.0000 mL | Freq: Once | INTRAVENOUS | Status: DC

## 2019-06-21 MED ORDER — SODIUM CHLORIDE (PF) 0.9 % IJ SOLN
INTRAMUSCULAR | Status: AC
  Filled 2019-06-21: qty 50

## 2019-06-21 NOTE — ED Notes (Signed)
[place] PA at bedside 

## 2019-06-21 NOTE — ED Notes (Signed)
[place] MD at bedside

## 2019-06-21 NOTE — ED Notes (Signed)
Pt assisted onto bedpan. [place] at bedside

## 2019-06-21 NOTE — ED Triage Notes (Signed)
Per patient's husband: Patient was to get a liver transplant on the 9th of January and tested positive for COVID. Patient was sent home and unable to get a liver due to the virus. Patient began having increased pain this morning that radiates to the right flank and was sent in by her hepatologist to be evaluated and if needed transferred to [place]. Patient's hepatologist would like to be called and her husband is in the car requesting an update.

## 2019-06-21 NOTE — ED Notes (Signed)
Pt verbalizes understanding of DC instructions. Pt belongings returned and assisted in wheelchair out of ED.  

## 2019-06-21 NOTE — ED Provider Notes (Signed)
[name] Provider Note   CSN: [id number] Arrival date & time: 06/21/19  1321     History Chief Complaint  Patient presents with  . Abdominal Pain  . Flank Pain    Patricia Lutz is a 34 y.o. female with history of cirrhosis of the liver, GERD, Hashimoto's disease, hypothyroidism, alcohol use disorder presents for evaluation of acute onset, progressively worsening lightheadedness, generalized weakness, nausea beginning today.  She states that she feels "out of it".  She notes gnawing upper abdominal pain which she states is chronic for her and has been persistent for years due to her history of cirrhosis.  She took some Zofran with little relief.  She states she has had all of her medications except for her furosemide today.  She denies any fevers, cough, or chest pain recently but does note some palpitations and feels her heart racing sometimes.  She was scheduled for a liver transplant earlier this month but tested positive for COVID on 05/30/2019 and reports that she is currently asymptomatic from this standpoint.  5:23 PM Spoke with patient's husband on the phone with patient's permission. Patient has been seeing Dr. Angela Adam at the [facility] transplant center.  He reports that at around 11:30 AM today the patient began to complain of lower back pain, worsening abdominal pain and feeling of irregular heartbeat.  She also complained of feeling flushed and lightheaded.  He noticed that her mental status was waxing and waning from baseline.  He contacted the transplant coordinator who recommended evaluation at a nearby facility so they came here.  They were told that we should reach out to the transplant center for further recommendations. Can call [phone] ask to speak to liver fellow on call.   The history is provided by the patient.       Past Medical History:  Diagnosis Date  . Anxiety   . Cirrhosis ([place])   . Depression   . GERD (gastroesophageal  reflux disease)   . Hashimoto's disease   . History of stomach ulcers   . Hypothyroidism   . Substance abuse ([place])    alcohol    Patient Active Problem List   Diagnosis Date Noted  . GAD (generalized anxiety disorder) 07/12/2018  . 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 Alcohol use disorder, severe, in early remission ([place]) 11/24/2015  . Nausea without vomiting 11/24/2015  . Abdominal pain, epigastric 11/24/2015  . Diarrhea 11/24/2015  . Poor appetite 11/24/2015  . History of ulcer disease 11/24/2015    Past Surgical History:  Procedure Laterality Date  . CESAREAN SECTION       OB History   No obstetric history on file.     Family History  Problem Relation Age of Onset  . Crohn's disease Mother   . Crohn's disease Maternal Grandmother   . Diabetes Brother     Social History   Tobacco Use  . Smoking status: Never Smoker  . Smokeless tobacco: Never Used  Substance Use Topics  . Alcohol use: Not Currently    Alcohol/week: 0.0 standard drinks  . Drug use: No    Home Medications Prior to Admission medications   Medication Sig Start Date End Date Taking? Authorizing Provider  ALPRAZolam Duanne Moron) 0.5 MG tablet Take 1 tablet (0.5 mg total) by mouth 3 (three) times daily as needed for anxiety or sleep. Patient taking differently: Take 0.25-0.5 mg by mouth 3 (three) times daily as needed for anxiety or sleep.  04/28/19 07/27/19 Yes Pucilowski, Marchia Bond, MD  eszopiclone (LUNESTA) 1 MG TABS tablet Take 1 tablet (1 mg total) by mouth at bedtime as needed for sleep. Take immediately before bedtime 06/11/19 08/10/19 Yes Pucilowski, Olgierd A, MD  furosemide (LASIX) 20 MG tablet Take 40 mg by mouth 2 (two) times daily.   Yes [provider]  levothyroxine (SYNTHROID, LEVOTHROID) 125 MCG tablet Take 125 mcg by mouth daily before breakfast.    Yes [provider]  methylphenidate (RITALIN) 5 MG tablet Take 1 tablet (5 mg total) by mouth 2 (two) times daily with breakfast  and lunch. Patient taking differently: Take 5 mg by mouth daily as needed (to boost energy).  06/11/19 07/11/19 Yes Pucilowski, Olgierd A, MD  midodrine (PROAMATINE) 10 MG tablet Take 10 mg by mouth 3 (three) times daily.  06/26/18 06/26/19 Yes [provider]  ondansetron (ZOFRAN-ODT) 4 MG disintegrating tablet Take 4 mg by mouth every 8 (eight) hours as needed for nausea or vomiting.  04/30/19  Yes [provider]  pantoprazole (PROTONIX) 40 MG tablet Take 1 tablet (40 mg total) by mouth 2 (two) times daily. Patient taking differently: Take 40 mg by mouth daily.  05/24/16  Yes Jola Schmidt, MD  spironolactone (ALDACTONE) 50 MG tablet Take 100 mg by mouth daily.  05/30/18 06/21/19 Yes [provider]  oxyCODONE (OXY IR/ROXICODONE) 5 MG immediate release tablet Take 1 tablet (5 mg total) by mouth every 6 (six) hours as needed for moderate pain or severe pain. Patient not taking: Reported on 06/21/2019 04/17/19   Molpus, Jenny Reichmann, MD  clonazePAM (KLONOPIN) 0.5 MG tablet Take 1 tablet (0.5 mg total) by mouth at bedtime as needed (sleep). Patient not taking: Reported on 04/17/2019 11/28/18 04/17/19  Pucilowski, Marchia Bond, MD    Allergies    Patient has no known allergies.  Review of Systems   Review of Systems  Constitutional: Positive for fatigue. Negative for chills and fever.  Respiratory: Negative for cough and shortness of breath.   Cardiovascular: Positive for palpitations and leg swelling. Negative for chest pain.  Gastrointestinal: Positive for abdominal pain and nausea. Negative for diarrhea and vomiting.  Genitourinary: Negative for dysuria, hematuria and urgency.  Neurological: Positive for light-headedness. Negative for syncope.  All other systems reviewed and are negative.   Physical Exam Updated Vital Signs BP (!) 102/46   Pulse 80   Temp 98.2 F (36.8 C) (Oral)   Resp 18   SpO2 100%   Physical Exam Vitals and nursing note reviewed.  Constitutional:       General: She is not in acute distress.    Comments: Chronically ill in appearance  HENT:     Head: Normocephalic and atraumatic.  Eyes:     General: Scleral icterus present.        Right eye: No discharge.        Left eye: No discharge.     Conjunctiva/sclera: Conjunctivae normal.  Neck:     Vascular: No JVD.     Trachea: No tracheal deviation.  Cardiovascular:     Rate and Rhythm: Normal rate and regular rhythm.     Comments: Trace nonpitting edema of the bilateral lower extremities.  2+ radial and DP/PT pulses bilaterally.  Compartments are soft. Pulmonary:     Effort: Pulmonary effort is normal.     Comments: Speaking in full sentences without difficulty Abdominal:     General: Bowel sounds are decreased. There is no distension.     Palpations: Abdomen is soft.     Tenderness:  There is generalized abdominal tenderness.     Comments: Maximally tender to palpation in the right upper quadrant of the abdomen  Skin:    General: Skin is warm.     Coloration: Skin is jaundiced.     Findings: No erythema.  Neurological:     Mental Status: She is alert.     Comments: Appears very fatigued.  Mildly confused.  Oriented to person place and month but not day of the week. No facial droop noted.  Moving all extremities spontaneously without difficulty.  Psychiatric:        Behavior: Behavior normal.     ED Results / Procedures / Treatments   Labs (all labs ordered are listed, but only abnormal results are displayed) Labs Reviewed  RESPIRATORY PANEL BY RT PCR (FLU A&B, COVID) - Abnormal; Notable for the following components:      Result Value   SARS Coronavirus 2 by RT PCR POSITIVE (*)    All other components within normal limits  COMPREHENSIVE METABOLIC PANEL - Abnormal; Notable for the following components:   Sodium 132 (*)    Chloride 97 (*)    AST 52 (*)    ALT 47 (*)    Total Bilirubin 14.4 (*)    All other components within normal limits  URINALYSIS, ROUTINE W REFLEX  MICROSCOPIC - Abnormal; Notable for the following components:   Specific Gravity, Urine 1.003 (*)    All other components within normal limits  PROTIME-INR - Abnormal; Notable for the following components:   Prothrombin Time 25.9 (*)    INR 2.4 (*)    All other components within normal limits  APTT - Abnormal; Notable for the following components:   aPTT 43 (*)    All other components within normal limits  CBC WITH DIFFERENTIAL/PLATELET - Abnormal; Notable for the following components:   RBC 2.48 (*)    Hemoglobin 9.9 (*)    HCT 29.2 (*)    MCV 117.7 (*)    MCH 39.9 (*)    Platelets 72 (*)    All other components within normal limits  LIPASE, BLOOD  AMMONIA  CBC WITH DIFFERENTIAL/PLATELET  I-STAT BETA HCG BLOOD, ED ([facility], [facility], [facility] ONLY)    EKG None  Radiology DG Chest Portable 1 View  Result Date: 06/21/2019 CLINICAL DATA:  Chest pain. EXAM: PORTABLE CHEST 1 VIEW COMPARISON:  None. FINDINGS: There is no evidence of acute infiltrate, pleural effusion or pneumothorax. The cardiac silhouette is mildly enlarged. The visualized skeletal structures are unremarkable. IMPRESSION: No active disease. Electronically Signed   By: Virgina Norfolk M.D.   On: 06/21/2019 15:24   US Abdomen Limited RUQ  Result Date: 06/21/2019 CLINICAL DATA:  Right upper quadrant abdominal pain x2 months EXAM: ULTRASOUND ABDOMEN LIMITED RIGHT UPPER QUADRANT COMPARISON:  April 17, 2019 FINDINGS: Gallbladder: Gallstones are noted including gallstones near the gallbladder neck. There is gallbladder sludge. There is no pericholecystic free fluid or gallbladder wall thickening. The sonographic Percell Miller sign is negative. The gallbladder is distended. Common bile duct: Diameter: 3 mm Liver: There is a coarsened heterogeneous of the liver parenchyma. Portal vein is patent on color Doppler imaging with normal direction of blood flow towards the liver. Other: None. IMPRESSION: 1. Distended gallbladder with multiple gallstones.  However, there are no additional sonographic findings to suggest the diagnosis of acute cholecystitis. 2. Similar coarsened and heterogeneous appearance of the liver parenchyma Electronically Signed   By: Constance Holster M.D.   On: 06/21/2019 15:28  Procedures Procedures (including critical care time)  Medications Ordered in ED Medications  albumin human 25 % solution 50 g (has no administration in time range)  sodium chloride (PF) 0.9 % injection (has no administration in time range)    ED Course  I have reviewed the triage vital signs and the nursing notes.  Pertinent labs & imaging results that were available during my care of the patient were reviewed by me and considered in my medical decision making (see chart for details).    MDM Rules/Calculators/A&P                      Tyahna Shover was evaluated in Emergency Department on 06/21/2019 for the symptoms described in the history of present illness. She was evaluated in the context of the global COVID-19 pandemic, which necessitated consideration that the patient might be at risk for infection with the SARS-CoV-2 virus that causes COVID-19. Institutional protocols and algorithms that pertain to the evaluation of patients at risk for COVID-19 are in a state of rapid change based on information released by regulatory bodies including the CDC and federal and state organizations. These policies and algorithms were followed during the patient's care in the ED.   Patient presenting for evaluation of sudden onset generalized weakness, chills, slightly altered mental status.  Patient is afebrile, somewhat hypotensive in the ED though her blood pressures run low at baseline.  She appears chronically ill.  She is alert and oriented to person place and time but not day of the week.  She appears mildly confused but no focal neurologic deficits noted.  She had lab work done 3 days ago at [facility].  We will obtain blood work, chest x-ray, UA and  reassess.  Lab work reviewed by me shows worsening PT/INR and total bilirubin compared to blood work from 3 days ago.  Her H&H is stable.  Her electrolytes and renal function are also stable.  Her total bilirubin is 14.4 today up from 13 three days ago.  She tested positive for Covid again today but is completely asymptomatic from that standpoint.  Her ammonia is within normal limits.  UA does not suggest UTI or nephrolithiasis.  Lipase is within normal limits.  We obtained right upper quadrant ultrasound due to complaint of maximal tenderness to palpation at the right upper quadrant on examination today.  She did not have any peritoneal signs on examination.  Her ultrasound shows distended gallbladder with multiple gallstones but no definitive evidence of acute cholecystitis.  6:05 PM Spoke with Dr. Teena Irani with [facility].  She recommends holding patient's Lasix, giving 50 g of 25% albumin and obtaining a CT abdomen pelvis for further evaluation.  Currently low suspicion of SBP in the absence of leukocytosis, fever, or peritoneal signs on examination of the abdomen.  Her abdomen does not appear distended or suggest large volume ascites presently.  Plan to reassess patient after she receives albumin and after CT scan results.  If findings are reassuring and the patient is symptomatically feeling better then she can go home with close outpatient follow-up which Dr. Edison Pace will help arrange.  She will also update the patient's MELD score.  If the patient remains in pain or feels unwell then Dr. Edison Pace recommends transfer to [facility] for further evaluation and management.  She can be reached directly at [phone].  Dr. Ronnald Nian to follow-up on patient CT scan and reassessment for final disposition.  Final Clinical Impression(s) / ED Diagnoses Final diagnoses:  RUQ abdominal pain  Alcoholic cirrhosis, unspecified whether ascites present ([place])  Elevated INR  COVID-19 virus detected    Rx / DC  Orders ED Discharge Orders    None       Renita Papa, PA-C 06/21/19 [place], [place], DO 06/21/19 2119

## 2019-06-21 NOTE — ED Notes (Signed)
Pt transported to CT ?

## 2019-06-21 NOTE — ED Notes (Signed)
This RN called pts husband for update on pt condition.

## 2019-06-21 NOTE — ED Provider Notes (Addendum)
Medical screening examination/treatment/procedure(s) were conducted as a shared visit with non-physician practitioner(s) and myself.  I personally evaluated the patient during the encounter. Briefly, the patient is a 34 y.o. female with history of cirrhosis of the liver, GERD, Hashimoto's, alcohol use disorder who presents to the ED with generalized weakness.  Patient just feels lethargic.  She has had upper abdominal pain which is overall chronic.  Patient tested positive for coronavirus back January 9.  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  Patient denies any shortness of breath, chest pain.  Denies any nausea, vomiting, diarrhea.  Patient recently had blood work done 2 days ago in which her INR was 1.6.  Bilirubin was 13, AST was 63, ALT was 52.  She is awaiting liver transplant.  Has not had ascites in several months.  Has no abdominal distention on exam.  Abdomen is fairly soft.  Mostly tenderness in the right upper quadrant.  Ultrasound showed gallstones but no cholecystitis.  Lab work today shows AST of 52, ALT of 47, bilirubin of 14.4.  INR is 2.4.  Patient was discussed with liver transplant team at [facility] who recommended a CT scan abdomen and pelvis and albumin bolus.  They would like to be called back after these are done.  Patient prefers admission and will discuss that with her primary liver team.  Overall she is hemodynamically stable.  She feels better after fluids.  CT scan of the abdomen and pelvis is unremarkable.  There are no ascites.  Patient felt better after albumin.  Recontacted [facility] transplant team, Dr. Edison Pace, who feels comfortable with patient being discharged to home as well as I do.  They will arrange for close follow-up.  Her meld score has slightly increased and she is now higher on the transplant list.  Given return precautions and discharged from the ED in good condition.  This chart was  dictated using voice recognition software.  Despite best efforts to proofread,  errors can occur which can change the documentation meaning.     EKG Interpretation  Date/Time:  Sunday June 21 2019 19:00:13 EST Ventricular Rate:  96 PR Interval:    QRS Duration: 106 QT Interval:  419 QTC Calculation: 530 R Axis:   78 Text Interpretation: Sinus rhythm Prolonged QT interval Confirmed by Lennice Sites [id number]) on 06/21/2019 7:45:10 PM           Lennice Sites, DO 06/21/19 1944    Lennice Sites, DO 06/21/19 1945

## 2019-07-02 ENCOUNTER — Other Ambulatory Visit: Payer: Self-pay

## 2019-07-02 ENCOUNTER — Ambulatory Visit (INDEPENDENT_AMBULATORY_CARE_PROVIDER_SITE_OTHER): Payer: 59 | Admitting: Licensed Clinical Social Worker

## 2019-07-02 DIAGNOSIS — F1010 Alcohol abuse, uncomplicated: Secondary | ICD-10-CM

## 2019-07-02 DIAGNOSIS — F411 Generalized anxiety disorder: Secondary | ICD-10-CM

## 2019-07-02 NOTE — Progress Notes (Signed)
Virtual Visit via Telephone Note  I connected with Patricia Lutz on 07/02/19 at  4:00 PM EST by telephone and verified that I am speaking with the correct person using two identifiers.   I discussed the limitations, risks, security and privacy concerns of performing an evaluation and management service by telephone and the availability of in person appointments. I also discussed with the patient that there may be a patient responsible charge related to this service. The patient expressed understanding and agreed to proceed.  I discussed the assessment and treatment plan with the patient. The patient was provided an opportunity to ask questions and all were answered. The patient agreed with the plan and demonstrated an understanding of the instructions.   The patient was advised to call back or seek an in-person evaluation if the symptoms worsen or if the condition fails to improve as anticipated.  I provided 45 minutes of non-face-to-face time during this encounter.   Olegario Messier, LCSW    THERAPIST PROGRESS NOTE  Session Time: 4:10pm-5pm  Participation Level: Active  Behavioral Response: NA, Casual and telephone visitAlertDysphoric and Irritable  Type of Therapy: Individual Therapy  Treatment Goals addressed: Anxiety, Coping and Diagnosis: Alcohol use disorder  Interventions: CBT and Supportive  Summary: Patricia Lutz is a 34 y.o. female who presents with alcohol use disorder, currently sober, and generalized anxiety. Client reports being called for a transplant and after being in the operating room, the liver was declared too fatty and she had to return home. This is the second time client has been called to the hospital and turned away for surgery. Client states she understands why some people relapse during this process. Client denies craving for alcohol at this time.   Suicidal/Homicidal: No  Therapist Response: Clinician met with client via telehealth. Client is in her home at  the time of appointment. Clinician processed with client thoughts and feelings related to transplant being canceled second time. Clinician validated client thoughts and feelings related to the processed and praised clients use of relapse prevention skills.  Plan: Return again in 1-2 weeks.  Diagnosis: Axis I: Alcohol Abuse and Generalized Anxiety Disorder        Olegario Messier, LCSW 07/02/2019

## 2019-07-16 ENCOUNTER — Other Ambulatory Visit: Payer: Self-pay

## 2019-07-16 ENCOUNTER — Ambulatory Visit (INDEPENDENT_AMBULATORY_CARE_PROVIDER_SITE_OTHER): Payer: 59 | Admitting: Licensed Clinical Social Worker

## 2019-07-16 DIAGNOSIS — F411 Generalized anxiety disorder: Secondary | ICD-10-CM | POA: Diagnosis not present

## 2019-07-16 DIAGNOSIS — F1010 Alcohol abuse, uncomplicated: Secondary | ICD-10-CM

## 2019-07-16 NOTE — Progress Notes (Signed)
Virtual Visit via Telephone Note  I connected with Patricia Lutz on 07/16/19 at  4:00 PM EST by telephone and verified that I am speaking with the correct person using two identifiers.   I discussed the limitations, risks, security and privacy concerns of performing an evaluation and management service by telephone and the availability of in person appointments. I also discussed with the patient that there may be a patient responsible charge related to this service. The patient expressed understanding and agreed to proceed.  I discussed the assessment and treatment plan with the patient. The patient was provided an opportunity to ask questions and all were answered. The patient agreed with the plan and demonstrated an understanding of the instructions.   The patient was advised to call back or seek an in-person evaluation if the symptoms worsen or if the condition fails to improve as anticipated.  I provided 45 minutes of non-face-to-face time during this encounter.   Olegario Messier, LCSW    THERAPIST PROGRESS NOTE  Session Time: 4:10pm-4:55pm  Participation Level: Active  Behavioral Response: NADrowsyDepressed  Type of Therapy: Individual Therapy  Treatment Goals addressed: Anxiety and Diagnosis: Alcohol use disorder  Interventions: Motivational Interviewing and Supportive  Summary: Patricia Lutz is a 34 y.o. female who presents with alcohol use in remission and anxiety. Client reports being drowsy and anxious from changes in medications and tired from going back and forth to the hospital for possible transplants. Client reports maintaining sobriety though acknowledges frustration with frequent hospital visits and uncertainty including getting different responses and protocols each visit.  Suicidal/Homicidal: Nowithout intent/plan  Therapist Response: Clinician met with client, assessing for SI/HI/psychosis and overall level of functioning. Clinician processed with client thoughts  and feelings related to multiple trips to the hospital for transplant and not receiving a transplant. Clinician validated client feelings.  Plan: Return again in 1-2 weeks.  Diagnosis: Axis I: Alcohol Abuse and Anxiety Disorder NOS      Olegario Messier, LCSW 07/16/2019

## 2019-07-23 ENCOUNTER — Telehealth (HOSPITAL_COMMUNITY): Payer: Self-pay | Admitting: *Deleted

## 2019-07-23 ENCOUNTER — Other Ambulatory Visit (HOSPITAL_COMMUNITY): Payer: Self-pay | Admitting: Psychiatry

## 2019-07-23 MED ORDER — METHYLPHENIDATE HCL 5 MG PO TABS: 5.0000 mg | ORAL_TABLET | Freq: Every day | ORAL | 0 refills | Status: DC | PRN

## 2019-07-23 NOTE — Telephone Encounter (Signed)
Pt called requesting a refill of her Ritalin 5mg  starting 06/11/19. Pt has an upcoming appointment on 08/10/19. Please review.

## 2019-07-23 NOTE — Telephone Encounter (Signed)
Refill send. 

## 2019-07-28 ENCOUNTER — Ambulatory Visit (HOSPITAL_COMMUNITY): Payer: 59 | Admitting: Psychiatry

## 2019-07-30 ENCOUNTER — Other Ambulatory Visit (HOSPITAL_COMMUNITY): Payer: Self-pay | Admitting: Psychiatry

## 2019-08-03 ENCOUNTER — Other Ambulatory Visit (HOSPITAL_COMMUNITY): Payer: Self-pay | Admitting: Psychiatry

## 2019-08-03 ENCOUNTER — Telehealth (HOSPITAL_COMMUNITY): Payer: Self-pay | Admitting: *Deleted

## 2019-08-03 NOTE — Telephone Encounter (Signed)
Writer received request form pt for refills on Lunesta and Xanax. Lunesta 1mg  started 06/11/19 through 08/10/19. Writer does not see a current order for Xanax. Pt has an upcoming appointment on 08/10/19. Please review and advise.

## 2019-08-04 ENCOUNTER — Other Ambulatory Visit (HOSPITAL_COMMUNITY): Payer: Self-pay | Admitting: Psychiatry

## 2019-08-04 NOTE — Telephone Encounter (Signed)
I don't know what happened to Rx for Xanax - I did order it. Anyway, I have renewed both.

## 2019-08-06 ENCOUNTER — Other Ambulatory Visit: Payer: Self-pay

## 2019-08-06 ENCOUNTER — Ambulatory Visit (HOSPITAL_COMMUNITY): Payer: 59 | Admitting: Licensed Clinical Social Worker

## 2019-08-07 NOTE — Progress Notes (Unsigned)
   THERAPIST PROGRESS NOTE  Session Time: ***  Participation Level: {BHH PARTICIPATION LEVEL:22264}  Behavioral Response: {Appearance:22683}{BHH LEVEL OF CONSCIOUSNESS:22305}{BHH MOOD:22306}  Type of Therapy: {CHL AMB BH Type of Therapy:21022741}  Treatment Goals addressed: {CHL AMB BH Treatment Goals Addressed:21022754}  Interventions: {CHL AMB BH Type of Intervention:21022753}  Summary: Patricia Lutz is a 34 y.o. female who presents with ***.   Suicidal/Homicidal: {BHH YES OR NO:22294}{yes/no/with/without intent/plan:22693}  Therapist Response: ***  Plan: Return again in *** weeks.  Diagnosis: Axis I: {psych axis 1:31909}    Axis II: {psych axis 2:31910}    Olegario Messier, LCSW 08/06/2019

## 2019-08-10 ENCOUNTER — Other Ambulatory Visit: Payer: Self-pay

## 2019-08-10 ENCOUNTER — Ambulatory Visit (INDEPENDENT_AMBULATORY_CARE_PROVIDER_SITE_OTHER): Payer: 59 | Admitting: Psychiatry

## 2019-08-10 DIAGNOSIS — F1021 Alcohol dependence, in remission: Secondary | ICD-10-CM | POA: Diagnosis not present

## 2019-08-10 DIAGNOSIS — F411 Generalized anxiety disorder: Secondary | ICD-10-CM | POA: Diagnosis not present

## 2019-08-10 MED ORDER — METHYLPHENIDATE HCL 5 MG PO TABS: 5.0000 mg | ORAL_TABLET | Freq: Every day | ORAL | 0 refills | Status: DC | PRN

## 2019-08-10 MED ORDER — CLONAZEPAM 1 MG PO TABS: 1.0000 mg | ORAL_TABLET | Freq: Every day | ORAL | 1 refills | Status: DC

## 2019-08-10 NOTE — Progress Notes (Signed)
Grandfather MD/PA/NP OP Progress Note  08/10/2019 4:18 PM Mairen Wallenstein  MRN:  [id number] Interview was conducted by phone and I verified that I was speaking with the correct person using two identifiers. I discussed the limitations of evaluation and management by telemedicine and  the availability of in person appointments. Patient expressed understanding and agreed to proceed.  Chief Complaint: Anxiety, fatigue, insomnia.  HPI: 1yomarriedfemale with mixed anxiety disorder (GAD, panic disorder now improved) and night terrors which per her report triggered long standing alcohol drinking problem. She has liver cirrhosis and is waiting for a liver transplant. Decision was delayed because she tested positive for COVID (no symptoms and her husband tested negative). Her case will be reviewednext week and she hopes she may be able to get a transplant the following week (providing there is a donor). 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 She denies craving alcohol. Her husband appears to have some drinking problem but she denies that he brings alcohol home these days. She has tried and failedmultiple SSRIs/SNRIs prescribed in the past for anxiety but admittedly was also heavily drinking at the time. She has been on benzodiazepines for over 15 years. She denied nightmares/flashbackslately. Shewas taking alow dose of alprazolam bidfor anxiety/insomnia but recently noticed that anxiety increased (related to delayed process of approval for liver transplant) and she started to take three tabs at a time. It works well for anxiety and does not cause sedation (in the past she also used it for sleep).Her major problem now is fatigue. She would like to try a stimulant eg methylphenidate to see if she will have some improvement in  energy. This was cleared with her transplant team by consulting psychiatrist at [facility]. We added 5 mg of Ritalin for daytime fatigue. Her energy both mental and physical improved.  She started back taking Lunesta 1 mg with alprazolam at HS and her sleep returned to normal but she feels Johnnye Sima is making her feel tired in am and unless she takes  Alprazolam it take "hours" for it to start working.   Visit Diagnosis:    ICD-10-CM   1. GAD (generalized anxiety disorder)  F41.1   2. Alcohol use disorder, severe, in early remission ([place])  F10.21     Past Psychiatric History: Please see intake H&P.  Past Medical History:  Past Medical History:  Diagnosis Date  . Anxiety   . Cirrhosis ([place])   . Depression   . GERD (gastroesophageal reflux disease)   . Hashimoto's disease   . History of stomach ulcers   . Hypothyroidism   . Substance abuse ([place])    alcohol    Past Surgical History:  Procedure Laterality Date  . CESAREAN SECTION      Family Psychiatric History: None.  Family History:  Family History  Problem Relation Age of Onset  . Crohn's disease Mother   . Crohn's disease Maternal Grandmother   . Diabetes Brother     Social History:  Social History   Socioeconomic History  . Marital status: Unknown    Spouse name: Not on file  . Number of children: Not on file  . Years of education: Not on file  . Highest education level: Not on file  Occupational History  . Not on file  Tobacco Use  . Smoking status: Never  Smoker  . Smokeless tobacco: Never Used  Substance and Sexual Activity  . Alcohol use: Not Currently    Alcohol/week: 0.0 standard drinks  . Drug use: No  . Sexual activity: Not on file  Other Topics Concern  . Not on file  Social History Narrative  . Not on file   Social Determinants of Health   Financial Resource Strain:   . Difficulty of Paying Living Expenses:   Food Insecurity:   . Worried About Charity fundraiser in the Last Year:   . Academic librarian in the Last Year:   Transportation Needs:   . Film/video editor (Medical):   Marland Kitchen Lack of Transportation (Non-Medical):   Physical Activity:   . Days of Exercise per Week:   . Minutes of Exercise per Session:   Stress:   . Feeling of Stress :   Social Connections:   . Frequency of Communication with Friends and Family:   . Frequency of Social Gatherings with Friends and Family:   . Attends Religious Services:   . Active Member of Clubs or Organizations:   . Attends Archivist Meetings:   Marland Kitchen Marital Status:     Allergies: No Known Allergies  Metabolic Disorder Labs: No results found for: HGBA1C, MPG No results found for: PROLACTIN No results found for: CHOL, TRIG, HDL, CHOLHDL, VLDL, LDLCALC No results found for: TSH  Therapeutic Level Labs: No results found for: LITHIUM No results found for: VALPROATE No components found for:  CBMZ  Current Medications: Current Outpatient Medications  Medication Sig Dispense Refill  . ALPRAZolam (XANAX) 0.5 MG tablet TAKE 1 TABLET (0.5 MG TOTAL) BY MOUTH 3 (THREE) TIMES DAILY AS NEEDED FOR ANXIETY OR SLEEP. 90 tablet 2  . clonazePAM (KLONOPIN) 1 MG tablet Take 1 tablet (1 mg total) by mouth at bedtime. 30 tablet 1  . furosemide (LASIX) 20 MG tablet Take 40 mg by mouth 2 (two) times daily.    Marland Kitchen levothyroxine (SYNTHROID, LEVOTHROID) 125 MCG tablet Take 125 mcg by mouth daily before breakfast.     . [START ON 08/22/2019] methylphenidate (RITALIN) 5 MG tablet Take 1 tablet (5 mg total) by mouth daily as needed (to boost energy). 30 tablet 0  . ondansetron (ZOFRAN-ODT) 4 MG disintegrating tablet Take 4 mg by mouth every 8 (eight) hours as needed for nausea or vomiting.     Marland Kitchen oxyCODONE (OXY IR/ROXICODONE) 5 MG immediate release tablet Take 1 tablet (5 mg total) by mouth every 6 (six) hours as needed for moderate pain or severe pain. (Patient not taking: Reported on 06/21/2019) 30 tablet 0  . pantoprazole (PROTONIX) 40 MG tablet Take 1  tablet (40 mg total) by mouth 2 (two) times daily. (Patient taking differently: Take 40 mg by mouth daily. ) 60 tablet 0  . spironolactone (ALDACTONE) 50 MG tablet Take 100 mg by mouth daily.      No current facility-administered medications for this visit.     Psychiatric Specialty Exam: Review of Systems  Constitutional: Positive for fatigue.  Psychiatric/Behavioral: Positive for sleep disturbance. The patient is nervous/anxious.   All other systems reviewed and are negative.   There were no vitals taken for this visit.There is no height or weight on file to calculate BMI.  General Appearance: NA  Eye Contact:  NA  Speech:  Clear and Coherent and Normal Rate  Volume:  Normal  Mood:  Anxious  Affect:  NA  Thought Process:  Goal Directed and Linear  Orientation:  Full (Time, Place, and Person)  Thought Content: Logical   Suicidal Thoughts:  No  Homicidal Thoughts:  No  Memory:  Immediate;   Good Recent;   Good Remote;   Good  Judgement:  Good  Insight:  Good  Psychomotor Activity:  NA  Concentration:  Concentration: Good  Recall:  Good  Fund of Knowledge: Good  Language: Good  Akathisia:  Negative  Handed:  Right  AIMS (if indicated): not done  Assets:  Communication Skills Desire for Improvement Financial Resources/Insurance Housing Resilience Social Support  ADL's:  Intact  Cognition: WNL  Sleep:  Fair    Assessment and Plan: 33yomarriedfemale with mixed anxiety disorder (GAD, panic disorder now improved) and night terrors which per her report triggered long standing alcohol drinking problem. She has liver cirrhosis and is waiting for a liver transplant. Decision was delayed because she tested positive for COVID (no symptoms and her husband tested negative). Her case will be reviewednext week and she hopes she may be able to get a transplant the following week (providing there is a donor). In remission of alcohol use disorder since November 2019 (relepsed on  her birthday when drank alcohol drink x 1but has not used alcohol at all for past 6 months).She is AA connected ([employer] and talks to her sponsor often). She denies craving alcohol. Her husband appears to have some drinking problem but she denies that he brings alcohol home these days. She has tried and failedmultiple SSRIs/SNRIs prescribed in the past for anxiety but admittedly was also heavily drinking at the time. She has been on benzodiazepines for over 15 years. She denied nightmares/flashbackslately. Shewas taking alow dose of alprazolam bidfor anxiety/insomnia but recently noticed that anxiety increased (related to delayed process of approval for liver transplant) and she started to take three tabs at a time. It works well for anxiety and does not cause sedation (in the past she also used it for sleep).Her major problem now is fatigue. She would like to try a stimulant eg methylphenidate to see if she will have some improvement in energy. This was cleared with her transplant team by consulting psychiatrist at [facility]. We added 5 mg of Ritalin for daytime fatigue. Her energy both mental and physical improved.  She started back taking Lunesta 1 mg with alprazolam at HS and her sleep returned to normal but she feels Johnnye Sima is making her feel tired in am and unless she takes  Alprazolam it take "hours" for it to start working.   Dx: GAD, Nightmare disorder, resolved; Alcohol use disorder severe in early remission  Plan: Continue alprazolam0.5 mgprn anxiety/sleep, dc eszopiclone 1 mg prn sleep and we will try clonazepam 1 mg instead - she tried it before and found it to be too sedating to take for anxiety during the day.  We will continue methylphenidate 5 mg q AM  for fatigue. Next appointment in two months.The plan was discussed with patient who had an opportunity to ask questions and these were all answered. I spend25 minutes inphone consultation with the patient.   Stephanie Acre, MD 08/10/2019, 4:18 PM

## 2019-08-20 ENCOUNTER — Ambulatory Visit (INDEPENDENT_AMBULATORY_CARE_PROVIDER_SITE_OTHER): Payer: 59 | Admitting: Licensed Clinical Social Worker

## 2019-08-20 DIAGNOSIS — F411 Generalized anxiety disorder: Secondary | ICD-10-CM | POA: Diagnosis not present

## 2019-08-20 DIAGNOSIS — F1021 Alcohol dependence, in remission: Secondary | ICD-10-CM | POA: Diagnosis not present

## 2019-08-20 MED ORDER — GENERIC EXTERNAL MEDICATION
60.00 | Status: DC
Start: 2019-08-19 — End: 2019-08-20

## 2019-08-20 MED ORDER — CIPROFLOXACIN HCL 500 MG PO TABS
500.00 | ORAL_TABLET | ORAL | Status: DC
Start: 2019-08-18 — End: 2019-08-20

## 2019-08-20 MED ORDER — MIDODRINE HCL 5 MG PO TABS
15.00 | ORAL_TABLET | ORAL | Status: DC
Start: 2019-08-19 — End: 2019-08-20

## 2019-08-20 MED ORDER — GENERIC EXTERNAL MEDICATION
125.00 | Status: DC
Start: 2019-08-19 — End: 2019-08-20

## 2019-08-20 MED ORDER — OXYCODONE HCL 5 MG PO TABS
5.00 | ORAL_TABLET | ORAL | Status: DC
Start: ? — End: 2019-08-20

## 2019-08-20 MED ORDER — LACTULOSE 10 GM/15ML PO SOLN
30.00 | ORAL | Status: DC
Start: ? — End: 2019-08-20

## 2019-08-20 MED ORDER — ACETAMINOPHEN 325 MG PO TABS
325.00 | ORAL_TABLET | ORAL | Status: DC
Start: ? — End: 2019-08-20

## 2019-08-20 MED ORDER — RIFAXIMIN 550 MG PO TABS
550.00 | ORAL_TABLET | ORAL | Status: DC
Start: 2019-08-19 — End: 2019-08-20

## 2019-08-20 MED ORDER — PANTOPRAZOLE SODIUM 40 MG PO TBEC
40.00 | DELAYED_RELEASE_TABLET | ORAL | Status: DC
Start: 2019-08-19 — End: 2019-08-20

## 2019-08-20 MED ORDER — CLONAZEPAM 1 MG PO TABS
1.00 | ORAL_TABLET | ORAL | Status: DC
Start: ? — End: 2019-08-20

## 2019-08-20 MED ORDER — SPIRONOLACTONE 50 MG PO TABS
100.00 | ORAL_TABLET | ORAL | Status: DC
Start: 2019-08-19 — End: 2019-08-20

## 2019-08-20 MED ORDER — LIDOCAINE HCL 1 % IJ SOLN
0.50 | INTRAMUSCULAR | Status: DC
Start: ? — End: 2019-08-20

## 2019-09-01 MED ORDER — TENOFOVIR ALAFENAMIDE FUMARATE 25 MG PO TABS
25.00 | ORAL_TABLET | ORAL | Status: DC
Start: 2019-09-03 — End: 2019-09-01

## 2019-09-01 MED ORDER — INSULIN REGULAR HUMAN 100 UNIT/ML IJ SOLN
5.00 | INTRAMUSCULAR | Status: DC
Start: 2019-09-03 — End: 2019-09-01

## 2019-09-01 MED ORDER — HEPARIN SODIUM (PORCINE) 5000 UNIT/ML IJ SOLN
5000.00 | INTRAMUSCULAR | Status: DC
Start: 2019-09-03 — End: 2019-09-01

## 2019-09-01 MED ORDER — INSULIN REGULAR HUMAN 100 UNIT/ML IJ SOLN
0.00 | INTRAMUSCULAR | Status: DC
Start: 2019-09-03 — End: 2019-09-01

## 2019-09-01 MED ORDER — TRAZODONE HCL 50 MG PO TABS
50.00 | ORAL_TABLET | ORAL | Status: DC
Start: ? — End: 2019-09-01

## 2019-09-01 MED ORDER — VALGANCICLOVIR HCL 450 MG PO TABS
450.00 | ORAL_TABLET | ORAL | Status: DC
Start: 2019-09-03 — End: 2019-09-01

## 2019-09-01 MED ORDER — MELATONIN 3 MG PO TABS
6.00 | ORAL_TABLET | ORAL | Status: DC
Start: 2019-09-03 — End: 2019-09-01

## 2019-09-01 MED ORDER — LIDOCAINE 5 % EX PTCH
1.00 | MEDICATED_PATCH | CUTANEOUS | Status: DC
Start: 2019-09-03 — End: 2019-09-01

## 2019-09-01 MED ORDER — MYCOPHENOLATE MOFETIL 500 MG PO TABS
1000.00 | ORAL_TABLET | ORAL | Status: DC
Start: 2019-09-03 — End: 2019-09-01

## 2019-09-01 MED ORDER — OXYCODONE HCL 5 MG PO TABS
5.00 | ORAL_TABLET | ORAL | Status: DC
Start: ? — End: 2019-09-01

## 2019-09-01 MED ORDER — PREDNISONE 20 MG PO TABS
20.00 | ORAL_TABLET | ORAL | Status: DC
Start: 2019-09-03 — End: 2019-09-01

## 2019-09-01 MED ORDER — LORAZEPAM 2 MG/ML IJ SOLN
0.25 | INTRAMUSCULAR | Status: DC
Start: ? — End: 2019-09-01

## 2019-09-01 MED ORDER — PANTOPRAZOLE SODIUM 40 MG PO TBEC
40.00 | DELAYED_RELEASE_TABLET | ORAL | Status: DC
Start: 2019-09-03 — End: 2019-09-01

## 2019-09-01 MED ORDER — ACETAMINOPHEN 325 MG PO TABS
325.00 | ORAL_TABLET | ORAL | Status: DC
Start: ? — End: 2019-09-01

## 2019-09-01 MED ORDER — HEPARIN SODIUM (PORCINE) 1000 UNIT/ML IJ SOLN
INTRAMUSCULAR | Status: DC
Start: ? — End: 2019-09-01

## 2019-09-01 MED ORDER — DEXTROSE 50 % IV SOLN
12.50 | INTRAVENOUS | Status: DC
Start: ? — End: 2019-09-01

## 2019-09-01 MED ORDER — SULFAMETHOXAZOLE-TRIMETHOPRIM 800-160 MG PO TABS
1.00 | ORAL_TABLET | ORAL | Status: DC
Start: 2019-09-04 — End: 2019-09-01

## 2019-09-01 MED ORDER — POLYETHYLENE GLYCOL 3350 17 GM/SCOOP PO POWD
17.00 | ORAL | Status: DC
Start: 2019-09-02 — End: 2019-09-01

## 2019-09-01 MED ORDER — LEVOTHYROXINE SODIUM 125 MCG PO TABS
125.00 | ORAL_TABLET | ORAL | Status: DC
Start: 2019-09-04 — End: 2019-09-01

## 2019-09-01 MED ORDER — LIDOCAINE HCL 1 % IJ SOLN
0.50 | INTRAMUSCULAR | Status: DC
Start: ? — End: 2019-09-01

## 2019-09-01 MED ORDER — CLOTRIMAZOLE 10 MG MT TROC
10.00 | OROMUCOSAL | Status: DC
Start: 2019-09-03 — End: 2019-09-01

## 2019-09-01 MED ORDER — ONDANSETRON HCL 4 MG/2ML IJ SOLN
4.00 | INTRAMUSCULAR | Status: DC
Start: ? — End: 2019-09-01

## 2019-09-01 MED ORDER — ASPIRIN 81 MG PO CHEW
81.00 | CHEWABLE_TABLET | ORAL | Status: DC
Start: 2019-09-03 — End: 2019-09-01

## 2019-09-01 MED ORDER — TACROLIMUS 0.5 MG PO CAPS
0.50 | ORAL_CAPSULE | ORAL | Status: DC
Start: 2019-09-03 — End: 2019-09-01

## 2019-09-01 MED ORDER — GLUCAGON (RDNA) 1 MG IJ KIT
1.00 | PACK | INTRAMUSCULAR | Status: DC
Start: ? — End: 2019-09-01

## 2019-09-01 MED ORDER — SENNOSIDES-DOCUSATE SODIUM 8.6-50 MG PO TABS
2.00 | ORAL_TABLET | ORAL | Status: DC
Start: 2019-09-01 — End: 2019-09-01

## 2019-09-01 NOTE — Progress Notes (Signed)
Virtual Visit via Video Note  I connected with Patricia Lutz on 08/20/2019 at  4:00 PM EDT by a video enabled telemedicine application and verified that I am speaking with the correct person using two identifiers.   I discussed the limitations of evaluation and management by telemedicine and the availability of in person appointments. The patient expressed understanding and agreed to proceed.  I discussed the assessment and treatment plan with the patient. The patient was provided an opportunity to ask questions and all were answered. The patient agreed with the plan and demonstrated an understanding of the instructions.   The patient was advised to call back or seek an in-person evaluation if the symptoms worsen or if the condition fails to improve as anticipated.  I provided 45 minutes of non-face-to-face time during this encounter.   Olegario Messier, LCSW    THERAPIST PROGRESS NOTE  Session Time: 4:05pm-4:55pm  Participation Level: Active  Behavioral Response: CasualAlertIrritable  Type of Therapy: Individual Therapy  Treatment Goals addressed: Coping and Diagnosis: alcohol abuse  Interventions: CBT and Supportive  Summary: Patricia Lutz is a 34 y.o. female who presents with alcohol abuse and symptoms of anxiety. Client processed changes in physical and mental health. Client shared the benefits of her starting to journal again and processed some topics which she has been journaling about.   Suicidal/Homicidal: Nowithout intent/plan  Therapist Response: Clinician met with client via telehealth. Clinician and client processed frequency of changes related to transplant. Clinician provided summarizing statements and validated client thoughts and feelings.  Plan: Return again in 2 weeks.  Diagnosis: Axis I: Alcohol Abuse      Olegario Messier, LCSW 08/20/2019

## 2019-09-03 ENCOUNTER — Other Ambulatory Visit: Payer: Self-pay

## 2019-09-03 ENCOUNTER — Ambulatory Visit (HOSPITAL_COMMUNITY): Payer: 59 | Admitting: Licensed Clinical Social Worker

## 2019-09-03 MED ORDER — CLONAZEPAM 0.5 MG PO TABS
0.25 | ORAL_TABLET | ORAL | Status: DC
Start: 2019-09-03 — End: 2019-09-03

## 2019-09-03 MED ORDER — CLONAZEPAM 0.5 MG PO TABS
0.25 | ORAL_TABLET | ORAL | Status: DC
Start: ? — End: 2019-09-03

## 2019-09-03 MED ORDER — POLYETHYLENE GLYCOL 3350 17 GM/SCOOP PO POWD
17.00 | ORAL | Status: DC
Start: ? — End: 2019-09-03

## 2019-09-03 MED ORDER — TACROLIMUS 0.5 MG PO CAPS
0.50 | ORAL_CAPSULE | ORAL | Status: DC
Start: 2019-09-03 — End: 2019-09-03

## 2019-09-03 MED ORDER — FAMOTIDINE 20 MG PO TABS
20.00 | ORAL_TABLET | ORAL | Status: DC
Start: 2019-09-03 — End: 2019-09-03

## 2019-09-03 MED ORDER — SENNOSIDES-DOCUSATE SODIUM 8.6-50 MG PO TABS
2.00 | ORAL_TABLET | ORAL | Status: DC
Start: 2019-09-03 — End: 2019-09-03

## 2019-09-24 ENCOUNTER — Telehealth (INDEPENDENT_AMBULATORY_CARE_PROVIDER_SITE_OTHER): Payer: 59 | Admitting: Psychiatry

## 2019-09-24 ENCOUNTER — Other Ambulatory Visit: Payer: Self-pay

## 2019-09-24 DIAGNOSIS — F515 Nightmare disorder: Secondary | ICD-10-CM

## 2019-09-24 DIAGNOSIS — F1021 Alcohol dependence, in remission: Secondary | ICD-10-CM | POA: Diagnosis not present

## 2019-09-24 DIAGNOSIS — F411 Generalized anxiety disorder: Secondary | ICD-10-CM

## 2019-09-24 MED ORDER — CLONAZEPAM 1 MG PO TABS: 1.0000 mg | ORAL_TABLET | Freq: Every day | ORAL | 1 refills | Status: DC

## 2019-09-24 NOTE — Progress Notes (Signed)
[place] MD/PA/NP OP Progress Note  09/24/2019 4:09 PM Clifford Coudriet  MRN:  [id number] Interview was conducted by phone and I verified that I was speaking with the correct person using two identifiers. I discussed the limitations of evaluation and management by telemedicine and  the availability of in person appointments. Patient expressed understanding and agreed to proceed.  Chief Complaint: Anxiety, nausea/vomiting.  HPI: 61yomarriedfemale with mixed anxiety disorder (GAD, panic disorder now improved) and night terrors which per her report triggered long standing alcohol drinking problem. She had liver cirrhosis and finally had liver transplant at [facility] on 08/22/19. She had some complications afterwards - kidney failure with delirium. She is still nauseated, anxiety worsened but is now manageable . She still is at [facility] and continues on alprazolam porn and clonazepam at HS. Ritalin has not been continued (for fatigue) and she feels it is not needed. In remission of alcohol use disorder since November 2019 (relepsed on her birthday when drank alcohol drink x 1but has not used alcohol at all for past 6 months).She is AA connected (has a sponsor). She denies craving alcohol. Her husband appears to have some drinking problem but she denies that he brings alcohol home these days. She has tried and failedmultiple SSRIs/SNRIs prescribed in the past for anxiety but admittedly was also heavily drinking at the time. She has been on benzodiazepines for over 15 years. She denied nightmares/flashbackslately.    Visit Diagnosis:    ICD-10-CM   1. GAD (generalized anxiety disorder)  F41.1   2. 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  Past Medical History:  Past Medical History:  Diagnosis Date  . Anxiety   . Cirrhosis ([place])   . Depression   . GERD (gastroesophageal reflux disease)   . Hashimoto's  disease   . History of stomach ulcers   . Hypothyroidism   . Substance abuse ([place])    alcohol    Past Surgical History:  Procedure Laterality Date  . CESAREAN SECTION      Family Psychiatric History: None.  Family History:  Family History  Problem Relation Age of Onset  . Crohn's disease Mother   . Crohn's disease Maternal Grandmother   . Diabetes Brother     Social History:  Social History   Socioeconomic History  . Marital status: Unknown    Spouse name: Not on file  . Number of children: Not on file  . Years of education: Not on file  . Highest education level: Not on file  Occupational History  . Not on file  Tobacco Use  . Smoking status: Never Smoker  . Smokeless tobacco: Never Used  Substance and Sexual Activity  . Alcohol use: Not Currently    Alcohol/week: 0.0 standard drinks  . Drug use: No  . Sexual activity: Not on file  Other Topics Concern  . Not on file  Social History Narrative  . Not on file   Social Determinants of Health   Financial Resource Strain:   . Difficulty of Paying Living Expenses:   Food Insecurity:   . Worried About Charity fundraiser in the Last Year:   . Arboriculturist in the Last Year:   Transportation Needs:   . Film/video editor (Medical):   Marland Kitchen Lack of Transportation (Non-Medical):   Physical Activity:   . Days of Exercise per Week:   .  Minutes of Exercise per Session:   Stress:   . Feeling of Stress :   Social Connections:   . Frequency of Communication with Friends and Family:   . Frequency of Social Gatherings with Friends and Family:   . Attends Religious Services:   . Active Member of Clubs or Organizations:   . Attends Archivist Meetings:   Marland Kitchen Marital Status:     Allergies: No Known Allergies  Metabolic Disorder Labs: No results found for: HGBA1C, MPG No results found for: PROLACTIN No results found for: CHOL, TRIG, HDL, CHOLHDL, VLDL, LDLCALC No results found for: TSH  Therapeutic  Level Labs: No results found for: LITHIUM No results found for: VALPROATE No components found for:  CBMZ  Current Medications: Current Outpatient Medications  Medication Sig Dispense Refill  . ALPRAZolam (XANAX) 0.5 MG tablet TAKE 1 TABLET (0.5 MG TOTAL) BY MOUTH 3 (THREE) TIMES DAILY AS NEEDED FOR ANXIETY OR SLEEP. 90 tablet 2  . [START ON 10/09/2019] clonazePAM (KLONOPIN) 1 MG tablet Take 1 tablet (1 mg total) by mouth at bedtime. 30 tablet 1  . furosemide (LASIX) 20 MG tablet Take 40 mg by mouth 2 (two) times daily.    Marland Kitchen levothyroxine (SYNTHROID, LEVOTHROID) 125 MCG tablet Take 125 mcg by mouth daily before breakfast.     . methylphenidate (RITALIN) 5 MG tablet Take 1 tablet (5 mg total) by mouth daily as needed (to boost energy). 30 tablet 0  . ondansetron (ZOFRAN-ODT) 4 MG disintegrating tablet Take 4 mg by mouth every 8 (eight) hours as needed for nausea or vomiting.     Marland Kitchen oxyCODONE (OXY IR/ROXICODONE) 5 MG immediate release tablet Take 1 tablet (5 mg total) by mouth every 6 (six) hours as needed for moderate pain or severe pain. (Patient not taking: Reported on 06/21/2019) 30 tablet 0  . pantoprazole (PROTONIX) 40 MG tablet Take 1 tablet (40 mg total) by mouth 2 (two) times daily. (Patient taking differently: Take 40 mg by mouth daily. ) 60 tablet 0  . spironolactone (ALDACTONE) 50 MG tablet Take 100 mg by mouth daily.      No current facility-administered medications for this visit.      Psychiatric Specialty Exam: Review of Systems  Gastrointestinal: Positive for nausea and vomiting.  Psychiatric/Behavioral: The patient is nervous/anxious.   All other systems reviewed and are negative.   There were no vitals taken for this visit.There is no height or weight on file to calculate BMI.  General Appearance: NA  Eye Contact:  NA  Speech:  Clear and Coherent and Normal Rate  Volume:  Normal  Mood:  Less anxious now.  Affect:  NA  Thought Process:  Goal Directed and Linear   Orientation:  Full (Time, Place, and Person)  Thought Content: Logical   Suicidal Thoughts:  No  Homicidal Thoughts:  No  Memory:  Immediate;   Good Recent;   Good Remote;   Good  Judgement:  Good  Insight:  Good  Psychomotor Activity:  NA  Concentration:  Concentration: Good  Recall:  Good  Fund of Knowledge: Good  Language: Good  Akathisia:  Negative  Handed:  Right  AIMS (if indicated): not done  Assets:  Communication Skills Desire for Improvement Financial Resources/Insurance Housing Resilience Social Support  ADL's:  Intact  Cognition: WNL  Sleep:  Good   Assessment and Plan: 33yomarriedfemale with mixed anxiety disorder (GAD, panic disorder now improved) and night terrors which per her report triggered long standing alcohol drinking  problem. She had liver cirrhosis and finally had liver transplant at [facility] on 08/22/19. She had some complications afterwards - kidney failure with delirium. She is still nauseated, anxiety worsened but is now manageable . She still is at [facility] and continues on alprazolam porn and clonazepam at HS. Ritalin has not been continued (for fatigue) and she feels it is not needed. In remission of alcohol use disorder since November 2019 (relepsed on her birthday when drank alcohol drink x 1but has not used alcohol at all for past 6 months).She is AA connected (has a sponsor). She denies craving alcohol. Her husband appears to have some drinking problem but she denies that he brings alcohol home these days. She has tried and failedmultiple SSRIs/SNRIs prescribed in the past for anxiety but admittedly was also heavily drinking at the time. She has been on benzodiazepines for over 15 years. She denied nightmares/flashbackslately.   Dx: GAD, Nightmare disorder, resolved; Alcohol use disorder severe in early remission  Plan: Continue alprazolam0.5 mgprn anxiety/sleep and clonazepam 1 mg instead of eszopiclone for insomnia.  We will not continue  methylphenidate 5 mgq AM for fatigue at this time. Next appointment in twomonths.The plan was discussed with patient who had an opportunity to ask questions and these were all answered. I spend20 minutes inphone consultation with the patient.    Stephanie Acre, MD 09/24/2019, 4:09 PM

## 2019-11-16 ENCOUNTER — Other Ambulatory Visit (HOSPITAL_COMMUNITY): Payer: Self-pay | Admitting: Psychiatry

## 2019-11-17 ENCOUNTER — Other Ambulatory Visit: Payer: Self-pay

## 2019-11-17 ENCOUNTER — Ambulatory Visit (HOSPITAL_COMMUNITY): Payer: 59 | Admitting: Licensed Clinical Social Worker

## 2019-11-24 ENCOUNTER — Other Ambulatory Visit: Payer: Self-pay

## 2019-11-24 ENCOUNTER — Ambulatory Visit (INDEPENDENT_AMBULATORY_CARE_PROVIDER_SITE_OTHER): Payer: 59 | Admitting: Licensed Clinical Social Worker

## 2019-11-24 DIAGNOSIS — F1021 Alcohol dependence, in remission: Secondary | ICD-10-CM

## 2019-11-24 DIAGNOSIS — F411 Generalized anxiety disorder: Secondary | ICD-10-CM | POA: Diagnosis not present

## 2019-11-26 ENCOUNTER — Telehealth (INDEPENDENT_AMBULATORY_CARE_PROVIDER_SITE_OTHER): Payer: 59 | Admitting: Psychiatry

## 2019-11-26 ENCOUNTER — Other Ambulatory Visit: Payer: Self-pay

## 2019-11-26 DIAGNOSIS — F1021 Alcohol dependence, in remission: Secondary | ICD-10-CM | POA: Diagnosis not present

## 2019-11-26 DIAGNOSIS — F411 Generalized anxiety disorder: Secondary | ICD-10-CM

## 2019-11-26 MED ORDER — CLONAZEPAM 1 MG PO TABS: 1.0000 mg | ORAL_TABLET | Freq: Every evening | ORAL | 2 refills | Status: DC | PRN

## 2019-11-26 MED ORDER — ALPRAZOLAM 1 MG PO TABS: 1.0000 mg | ORAL_TABLET | Freq: Three times a day (TID) | ORAL | 2 refills | Status: DC | PRN

## 2019-11-26 NOTE — Progress Notes (Signed)
BH MD/PA/NP OP Progress Note  11/26/2019 4:19 PM Patricia Lutz  MRN:  [id number] Interview was conducted by phone and I verified that I was speaking with the correct person using two identifiers. I discussed the limitations of evaluation and management by telemedicine and  the availability of in person appointments. Patient expressed understanding and agreed to proceed. Patient location - home; physician - home office.  Chief Complaint: Increased anxiety.  HPI: 34yomarriedfemale with mixed anxiety disorder (GAD, panic disorder now improved) and night terrors which per her report triggered long standing alcohol drinking problem. She had liver cirrhosis and finally had liver transplant at [facility] on 08/22/19. She had some complications afterwards - kidney failure with delirium - and continues to have a difficult and protracted recovery with diarrhea, nausea, pain. She is being weaned off Dilaudid. Her sleep improbved with clonazepam but anxiety has worsened. Ritalin has not been continued (for fatigue) and she feels it is not needed. In remission of alcohol use disorder since November 2019.She is AA connected (has a sponsor). She denies craving alcohol. She has tried and failedmultiple SSRIs/SNRIs prescribed in the past for anxiety but admittedly was also heavily drinking at the time. She has been on benzodiazepines for over 15 years. She denied nightmares/flashbackslately.    Visit Diagnosis:    ICD-10-CM   1. GAD (generalized anxiety disorder)  F41.1   2. Alcohol use disorder, moderate, in sustained remission ([place])  F10.21     Past Psychiatric History: Please see intake H&P>  Past Medical History:  Past Medical History:  Diagnosis Date  . Anxiety   . Cirrhosis ([place])   . Depression   . GERD (gastroesophageal reflux disease)   . Hashimoto's disease   . History of stomach ulcers   . Hypothyroidism   . Substance abuse ([place])    alcohol    Past Surgical History:  Procedure Laterality Date   . CESAREAN SECTION      Family Psychiatric History: None.  Family History:  Family History  Problem Relation Age of Onset  . Crohn's disease Mother   . Crohn's disease Maternal Grandmother   . Diabetes Brother     Social History:  Social History   Socioeconomic History  . Marital status: Unknown    Spouse name: Not on file  . Number of children: Not on file  . Years of education: Not on file  . Highest education level: Not on file  Occupational History  . Not on file  Tobacco Use  . Smoking status: Never Smoker  . Smokeless tobacco: Never Used  Vaping Use  . Vaping Use: Never used  Substance and Sexual Activity  . Alcohol use: Not Currently    Alcohol/week: 0.0 standard drinks  . Drug use: No  . Sexual activity: Not on file  Other Topics Concern  . Not on file  Social History Narrative  . Not on file   Social Determinants of Health   Financial Resource Strain:   . Difficulty of Paying Living Expenses:   Food Insecurity:   . Worried About Charity fundraiser in the Last Year:   . Arboriculturist in the Last Year:   Transportation Needs:   . Film/video editor (Medical):   Marland Kitchen Lack of Transportation (Non-Medical):   Physical Activity:   . Days of Exercise per Week:   . Minutes of Exercise per Session:   Stress:   . Feeling of Stress :   Social Connections:   . Frequency of  Communication with Friends and Family:   . Frequency of Social Gatherings with Friends and Family:   . Attends Religious Services:   . Active Member of Clubs or Organizations:   . Attends Archivist Meetings:   Marland Kitchen Marital Status:     Allergies: No Known Allergies  Metabolic Disorder Labs: No results found for: HGBA1C, MPG No results found for: PROLACTIN No results found for: CHOL, TRIG, HDL, CHOLHDL, VLDL, LDLCALC No results found for: TSH  Therapeutic Level Labs: No results found for: LITHIUM No results found for: VALPROATE No components found for:   CBMZ  Current Medications: Current Outpatient Medications  Medication Sig Dispense Refill  . ALPRAZolam (XANAX) 1 MG tablet Take 1 tablet (1 mg total) by mouth 3 (three) times daily as needed for anxiety. 90 tablet 2  . clonazePAM (KLONOPIN) 1 MG tablet Take 1 tablet (1 mg total) by mouth at bedtime as needed (sleep). 30 tablet 2  . furosemide (LASIX) 20 MG tablet Take 40 mg by mouth 2 (two) times daily.    Marland Kitchen levothyroxine (SYNTHROID, LEVOTHROID) 125 MCG tablet Take 125 mcg by mouth daily before breakfast.     . methylphenidate (RITALIN) 5 MG tablet Take 1 tablet (5 mg total) by mouth daily as needed (to boost energy). 30 tablet 0  . ondansetron (ZOFRAN-ODT) 4 MG disintegrating tablet Take 4 mg by mouth every 8 (eight) hours as needed for nausea or vomiting.     . pantoprazole (PROTONIX) 40 MG tablet Take 1 tablet (40 mg total) by mouth 2 (two) times daily. (Patient taking differently: Take 40 mg by mouth daily. ) 60 tablet 0  . spironolactone (ALDACTONE) 50 MG tablet Take 100 mg by mouth daily.      No current facility-administered medications for this visit.     Psychiatric Specialty Exam: Review of Systems  Psychiatric/Behavioral: The patient is nervous/anxious.   All other systems reviewed and are negative.   There were no vitals taken for this visit.There is no height or weight on file to calculate BMI.  General Appearance: NA  Eye Contact:  NA  Speech:  Clear and Coherent and Normal Rate  Volume:  Normal  Mood:  Anxious  Affect:  NA  Thought Process:  Goal Directed and Linear  Orientation:  Full (Time, Place, and Person)  Thought Content: Logical   Suicidal Thoughts:  No  Homicidal Thoughts:  No  Memory:  Immediate;   Good Recent;   Good Remote;   Good  Judgement:  Good  Insight:  Good  Psychomotor Activity:  NA  Concentration:  Concentration: Good  Recall:  Good  Fund of Knowledge: Good  Language: Good  Akathisia:  Negative  Handed:  Right  AIMS (if indicated):  not done  Assets:  Communication Skills Desire for Improvement Financial Resources/Insurance Housing Resilience Social Support  ADL's:  Intact  Cognition: WNL  Sleep:  Good    Assessment and Plan: 34yomarriedfemale with mixed anxiety disorder (GAD, panic disorder now improved) and night terrors which per her report triggered long standing alcohol drinking problem. She had liver cirrhosis and finally had liver transplant at [facility] on 08/22/19. She had some complications afterwards - kidney failure with delirium - and continues to have a difficult and protracted recovery with diarrhea, nausea, pain. She is being weaned off Dilaudid. Her sleep improbved with clonazepam but anxiety has worsened. Ritalin has not been continued (for fatigue) and she feels it is not needed. In remission of alcohol use disorder since  November 2019.She is AA connected (has a sponsor). She denies craving alcohol. She has tried and failedmultiple SSRIs/SNRIs prescribed in the past for anxiety but admittedly was also heavily drinking at the time. She has been on benzodiazepines for over 15 years. She denied nightmares/flashbackslately.   Dx: GAD, Nightmare disorder, resolved; Alcohol use disorder severe in remission  Plan: Continue alprazolam but we will increase dose to 1 mgprn anxiety and clonazepam 1 mg for insomnia. We will not continue methylphenidate 5 mgq AM for fatigue at this time. Next appointment in twomonths.The plan was discussed with patient who had an opportunity to ask questions and these were all answered. I spend20 minutes inphone consultation with the patient.   Stephanie Acre, MD 11/26/2019, 4:19 PM

## 2019-12-01 NOTE — Progress Notes (Signed)
Virtual Visit via Video Note  I connected with Patricia Lutz on 11/24/2019 at  3:00 PM EDT by a video enabled telemedicine application and verified that I am speaking with the correct person using two identifiers.   I discussed the limitations of evaluation and management by telemedicine and the availability of in person appointments. The patient expressed understanding and agreed to proceed.  I discussed the assessment and treatment plan with the patient. The patient was provided an opportunity to ask questions and all were answered. The patient agreed with the plan and demonstrated an understanding of the instructions.   The patient was advised to call back or seek an in-person evaluation if the symptoms worsen or if the condition fails to improve as anticipated.  I provided 45 minutes of non-face-to-face time during this encounter.  LOCATION: Client:home Clinician:office   Olegario Messier, LCSW    THERAPIST PROGRESS NOTE  Session Time: 3pm-3:45pm  Participation Level: Active  Behavioral Response: CasualAlertAnxious  Type of Therapy: Individual Therapy  Treatment Goals addressed: Anxiety, Coping and Diagnosis: alcohol use disorder, severe, in early remission  Interventions: CBT, Supportive and Reframing  Summary: Patricia Lutz is a 34 y.o. female who presents with alcohol abuse in early remission, post liver transplant. Client reported 'now I know my anxiety is real, not just being sick' client reported concerns about increasing any possibly addictive medication to help with anxiety. Client reports improvement in sleep and some in appetite however described what sounds like survivors guilt for receiving a transplant when others did not and died and that someone else had to die for her to receive her liver. Client processed thoughts about writing a letter to the donner's family following transplant and conflicting emotions about contact. Client processed frustration with feeling like  others are saying to only be grateful and happy when she has other feelings about 'life after transplant' and complications and restrictions which she did not expect to continue for as long.  Suicidal/Homicidal: Nowithout intent/plan  Therapist Response: Clinician checked in with client following liver transplant. Clinician and client processed expectations vs reality of changes in health/lifestyle following transplant. Clinician and client processed mixed emotions over several topics and frustration with people telling her how to feel. Clinician validated client feelings and worked with client to normalize some thoughts/feelings post transplant. Clinician provided TIPP skill for dealing with acute anxiety, and suggesting journal for ruminating thoughts.  Plan: Return again in 2-4 weeks.  Diagnosis: Axis I: Alcohol Abuse and Generalized Anxiety Disorder      Olegario Messier, LCSW 11/24/2019

## 2019-12-08 ENCOUNTER — Other Ambulatory Visit: Payer: Self-pay

## 2019-12-08 ENCOUNTER — Ambulatory Visit (HOSPITAL_COMMUNITY): Payer: 59 | Admitting: Licensed Clinical Social Worker

## 2019-12-29 ENCOUNTER — Ambulatory Visit (HOSPITAL_COMMUNITY): Payer: 59 | Admitting: Licensed Clinical Social Worker

## 2019-12-29 ENCOUNTER — Other Ambulatory Visit: Payer: Self-pay

## 2020-02-03 ENCOUNTER — Other Ambulatory Visit: Payer: Self-pay

## 2020-02-03 ENCOUNTER — Telehealth (INDEPENDENT_AMBULATORY_CARE_PROVIDER_SITE_OTHER): Payer: 59 | Admitting: Psychiatry

## 2020-02-03 DIAGNOSIS — F1021 Alcohol dependence, in remission: Secondary | ICD-10-CM | POA: Diagnosis not present

## 2020-02-03 DIAGNOSIS — F411 Generalized anxiety disorder: Secondary | ICD-10-CM

## 2020-02-03 MED ORDER — CLONAZEPAM 1 MG PO TABS: 1.0000 mg | ORAL_TABLET | Freq: Every evening | ORAL | 2 refills | Status: DC | PRN

## 2020-02-03 MED ORDER — ALPRAZOLAM 1 MG PO TABS: 1.0000 mg | ORAL_TABLET | Freq: Three times a day (TID) | ORAL | 2 refills | Status: DC | PRN

## 2020-02-03 NOTE — Progress Notes (Signed)
[place] MD/PA/NP OP Progress Note  02/03/2020 4:13 PM Patricia Lutz  MRN:  [id number] Interview was conducted by phone and I verified that I was speaking with the correct person using two identifiers. I discussed the limitations of evaluation and management by telemedicine and  the availability of in person appointments. Patient expressed understanding and agreed to proceed. Patient location - home; physician - home office.  Chief Complaint: "I feel much better".  HPI: 34yomarriedfemale with mixed anxiety disorder (GAD, panic disorder now improved) and night terrors which per her report triggered long standing alcohol drinking problem. She hadliver cirrhosis and finally had liver transplant at [facility] on 08/22/19. She had some complications afterwards - kidney failure with delirium - and continues to have a difficult and protracted recovery with diarrhea, nausea, pain. Her sleep improved with clonazepam but anxiety has worsened initially. SRitalin has not been continued (for fatigue) and she feels it is not needed.In remission of alcohol use disorder since November 2019.Her husband however drinks daily! She has a Social worker through [employer]. She denies craving alcohol. She has tried and failedmultiple SSRIs/SNRIs prescribed in the past for anxiety but admittedly was also heavily drinking at the time. She has been on benzodiazepines (alprazolam) for over 15 years - uses it daily most of the time 1-2 x. She denies feeling depressed, suicidal or having nightmares/flashbackslately.    Visit Diagnosis:    ICD-10-CM   1. GAD (generalized anxiety disorder)  F41.1   2. Alcohol use disorder, moderate, in sustained remission ([facility])  F10.21     Past Psychiatric History: Please see intake H&P.  Past Medical History:  Past Medical History:  Diagnosis Date  . Anxiety   . Cirrhosis ([place])   . Depression   . GERD (gastroesophageal reflux disease)   . Hashimoto's disease   . History of stomach  ulcers   . Hypothyroidism   . Substance abuse ([place])    alcohol    Past Surgical History:  Procedure Laterality Date  . CESAREAN SECTION      Family Psychiatric History: None.  Family History:  Family History  Problem Relation Age of Onset  . Crohn's disease Mother   . Crohn's disease Maternal Grandmother   . Diabetes Brother     Social History:  Social History   Socioeconomic History  . Marital status: Unknown    Spouse name: Not on file  . Number of children: Not on file  . Years of education: Not on file  . Highest education level: Not on file  Occupational History  . Not on file  Tobacco Use  . Smoking status: Never Smoker  . Smokeless tobacco: Never Used  Vaping Use  . Vaping Use: Never used  Substance and Sexual Activity  . Alcohol use: Not Currently    Alcohol/week: 0.0 standard drinks  . Drug use: No  . Sexual activity: Not on file  Other Topics Concern  . Not on file  Social History Narrative  . Not on file   Social Determinants of Health   Financial Resource Strain:   . Difficulty of Paying Living Expenses: Not on file  Food Insecurity:   . Worried About Charity fundraiser in the Last Year: Not on file  . Ran Out of Food in the Last Year: Not on file  Transportation Needs:   . Lack of Transportation (Medical): Not on file  . Lack of Transportation (Non-Medical): Not on file  Physical Activity:   . Days of Exercise per Week:  Not on file  . Minutes of Exercise per Session: Not on file  Stress:   . Feeling of Stress : Not on file  Social Connections:   . Frequency of Communication with Friends and Family: Not on file  . Frequency of Social Gatherings with Friends and Family: Not on file  . Attends Religious Services: Not on file  . Active Member of Clubs or Organizations: Not on file  . Attends Archivist Meetings: Not on file  . Marital Status: Not on file    Allergies: No Known Allergies  Metabolic Disorder Labs: No results  found for: HGBA1C, MPG No results found for: PROLACTIN No results found for: CHOL, TRIG, HDL, CHOLHDL, VLDL, LDLCALC No results found for: TSH  Therapeutic Level Labs: No results found for: LITHIUM No results found for: VALPROATE No components found for:  CBMZ  Current Medications: Current Outpatient Medications  Medication Sig Dispense Refill  . [START ON 02/26/2020] ALPRAZolam (XANAX) 1 MG tablet Take 1 tablet (1 mg total) by mouth 3 (three) times daily as needed for anxiety. 90 tablet 2  . [START ON 02/16/2020] clonazePAM (KLONOPIN) 1 MG tablet Take 1 tablet (1 mg total) by mouth at bedtime as needed (sleep). 30 tablet 2  . furosemide (LASIX) 20 MG tablet Take 40 mg by mouth 2 (two) times daily.    Marland Kitchen levothyroxine (SYNTHROID, LEVOTHROID) 125 MCG tablet Take 125 mcg by mouth daily before breakfast.     . ondansetron (ZOFRAN-ODT) 4 MG disintegrating tablet Take 4 mg by mouth every 8 (eight) hours as needed for nausea or vomiting.     . pantoprazole (PROTONIX) 40 MG tablet Take 1 tablet (40 mg total) by mouth 2 (two) times daily. (Patient taking differently: Take 40 mg by mouth daily. ) 60 tablet 0  . spironolactone (ALDACTONE) 50 MG tablet Take 100 mg by mouth daily.      No current facility-administered medications for this visit.      Psychiatric Specialty Exam: Review of Systems  Psychiatric/Behavioral: Positive for sleep disturbance. The patient is nervous/anxious.   All other systems reviewed and are negative.   There were no vitals taken for this visit.There is no height or weight on file to calculate BMI.  General Appearance: NA  Eye Contact:  NA  Speech:  Clear and Coherent and Normal Rate  Volume:  Normal  Mood:  Anxious  Affect:  NA  Thought Process:  Goal Directed and Linear  Orientation:  Full (Time, Place, and Person)  Thought Content: Logical   Suicidal Thoughts:  No  Homicidal Thoughts:  No  Memory:  Immediate;   Good Recent;   Good Remote;   Good   Judgement:  Good  Insight:  Good  Psychomotor Activity:  NA  Concentration:  Concentration: Good  Recall:  Good  Fund of Knowledge: Good  Language: Good  Akathisia:  Negative  Handed:  Right  AIMS (if indicated): not done  Assets:  Communication Skills Desire for Improvement Financial Resources/Insurance Housing  ADL's:  Intact  Cognition: WNL  Sleep:  Good   Assessment and Plan: 34yomarriedfemale with mixed anxiety disorder (GAD, panic disorder now improved) and night terrors which per her report triggered long standing alcohol drinking problem. She hadliver cirrhosis and finally had liver transplant at [facility] on 08/22/19. She had some complications afterwards - kidney failure with delirium - and continues to have a difficult and protracted recovery with diarrhea, nausea, pain. Her sleep improved with clonazepam but anxiety has worsened  initially. SRitalin has not been continued (for fatigue) and she feels it is not needed.In remission of alcohol use disorder since November 2019.Her husband however drinks daily! She has a Social worker through [employer]. She denies craving alcohol. She has tried and failedmultiple SSRIs/SNRIs prescribed in the past for anxiety but admittedly was also heavily drinking at the time. She has been on benzodiazepines (alprazolam) for over 15 years - uses it daily most of the time 1-2 x. She denies feeling depressed, suicidal or having nightmares/flashbackslately.   Dx: GAD, Nightmare disorder, resolved; Alcohol use disorder severe in remission  Plan: Continue alprazolam1 mgprn anxietyandclonazepam 1 mg for insomnia. Next appointment in threemonths.The plan was discussed with patient who had an opportunity to ask questions and these were all answered. I spend62minutes inphone consultation with the patient.    Stephanie Acre, MD 02/03/2020, 4:13 PM

## 2020-02-19 ENCOUNTER — Other Ambulatory Visit (HOSPITAL_COMMUNITY): Payer: Self-pay | Admitting: Psychiatry

## 2020-03-26 ENCOUNTER — Other Ambulatory Visit (HOSPITAL_COMMUNITY): Payer: Self-pay | Admitting: Psychiatry

## 2020-05-04 ENCOUNTER — Telehealth (INDEPENDENT_AMBULATORY_CARE_PROVIDER_SITE_OTHER): Payer: 59 | Admitting: Psychiatry

## 2020-05-04 ENCOUNTER — Other Ambulatory Visit: Payer: Self-pay

## 2020-05-04 DIAGNOSIS — F411 Generalized anxiety disorder: Secondary | ICD-10-CM

## 2020-05-04 MED ORDER — BUPROPION HCL ER (XL) 150 MG PO TB24
150.0000 mg | ORAL_TABLET | ORAL | 1 refills | Status: DC
Start: 2020-05-04 — End: 2020-05-27

## 2020-05-04 MED ORDER — ALPRAZOLAM 1 MG PO TABS: 1.0000 mg | ORAL_TABLET | Freq: Three times a day (TID) | ORAL | 2 refills | Status: DC | PRN

## 2020-05-04 NOTE — Progress Notes (Signed)
[place] MD/PA/NP OP Progress Note  05/04/2020 4:14 PM Patricia Lutz  MRN:  [id number] Interview was conducted by phone and I verified that I was speaking with the correct person using two identifiers. I discussed the limitations of evaluation and management by telemedicine and  the availability of in person appointments. Patient expressed understanding and agreed to proceed. Participants in the visit: patient (location - home); physician (location - home office).  Chief Complaint: [place].  HPI: 34yomarriedfemale with mixed anxiety disorder (GAD, panic disorder now improved) and night terrors which per her report triggered long standing alcohol drinking problem. She hadliver cirrhosis and finally had liver transplant at [facility] on 08/22/19. She had some complications afterwards - kidney failure with delirium- and continues to have a difficult and protracted recovery with diarrhea, nausea, pain. Her sleep improved with clonazepam but anxiety has worsened initially.SRitalin has not been continued (for fatigue) and she feels it is not needed.In remission of alcohol use disorder since November 2019.Her husband however drinks daily! She has a Social worker through [employer]. She denies craving alcohol. She has tried and failedmultiple SSRIs/SNRIs prescribed in the past for anxiety but admittedly was also heavily drinking at the time. She has been on benzodiazepines (alprazolam) for over 15 years - uses it daily most of the time 1-2 x. She denies feeling depressed, suicidal or having nightmares. She does however struggle with chronic fatigue.     Visit Diagnosis:    ICD-10-CM   1. GAD (generalized anxiety disorder)  F41.1     Past Psychiatric History: Please see intake H&P.  Past Medical History:  Past Medical History:  Diagnosis Date  . Anxiety   . Cirrhosis ([place])   . Depression   . GERD (gastroesophageal reflux disease)   . Hashimoto's disease   . History of stomach ulcers   .  Hypothyroidism   . Substance abuse ([place])    alcohol    Past Surgical History:  Procedure Laterality Date  . CESAREAN SECTION      Family Psychiatric History: None.  Family History:  Family History  Problem Relation Age of Onset  . Crohn's disease Mother   . Crohn's disease Maternal Grandmother   . Diabetes Brother     Social History:  Social History   Socioeconomic History  . Marital status: Unknown    Spouse name: Not on file  . Number of children: Not on file  . Years of education: Not on file  . Highest education level: Not on file  Occupational History  . Not on file  Tobacco Use  . Smoking status: Never Smoker  . Smokeless tobacco: Never Used  Vaping Use  . Vaping Use: Never used  Substance and Sexual Activity  . Alcohol use: Not Currently    Alcohol/week: 0.0 standard drinks  . Drug use: No  . Sexual activity: Not on file  Other Topics Concern  . Not on file  Social History Narrative  . Not on file   Social Determinants of Health   Financial Resource Strain: Not on file  Food Insecurity: Not on file  Transportation Needs: Not on file  Physical Activity: Not on file  Stress: Not on file  Social Connections: Not on file    Allergies: No Known Allergies  Metabolic Disorder Labs: No results found for: HGBA1C, MPG No results found for: PROLACTIN No results found for: CHOL, TRIG, HDL, CHOLHDL, VLDL, LDLCALC No results found for: TSH  Therapeutic Level Labs: No results found for: LITHIUM No results found  for: VALPROATE No components found for:  CBMZ  Current Medications: Current Outpatient Medications  Medication Sig Dispense Refill  . [START ON 05/26/2020] ALPRAZolam (XANAX) 1 MG tablet Take 1 tablet (1 mg total) by mouth 3 (three) times daily as needed for anxiety. 90 tablet 2  . buPROPion (WELLBUTRIN XL) 150 MG 24 hr tablet Take 1 tablet (150 mg total) by mouth every morning. 30 tablet 1  . clonazePAM (KLONOPIN) 1 MG tablet TAKE 1 TABLET (1 MG  TOTAL) BY MOUTH AT BEDTIME AS NEEDED (SLEEP). 30 tablet 2  . furosemide (LASIX) 20 MG tablet Take 40 mg by mouth 2 (two) times daily.    Marland Kitchen levothyroxine (SYNTHROID, LEVOTHROID) 125 MCG tablet Take 125 mcg by mouth daily before breakfast.     . ondansetron (ZOFRAN-ODT) 4 MG disintegrating tablet Take 4 mg by mouth every 8 (eight) hours as needed for nausea or vomiting.     . pantoprazole (PROTONIX) 40 MG tablet Take 1 tablet (40 mg total) by mouth 2 (two) times daily. (Patient taking differently: Take 40 mg by mouth daily. ) 60 tablet 0  . spironolactone (ALDACTONE) 50 MG tablet Take 100 mg by mouth daily.      No current facility-administered medications for this visit.     Psychiatric Specialty Exam: Review of Systems  Constitutional: Positive for fatigue.  Psychiatric/Behavioral: The patient is nervous/anxious.   All other systems reviewed and are negative.   There were no vitals taken for this visit.There is no height or weight on file to calculate BMI.  General Appearance: NA  Eye Contact:  NA  Speech:  Clear and Coherent and Normal Rate  Volume:  Normal  Mood:  Anxious  Affect:  NA  Thought Process:  Goal Directed and Linear  Orientation:  Full (Time, Place, and Person)  Thought Content: Logical   Suicidal Thoughts:  No  Homicidal Thoughts:  No  Memory:  Immediate;   Good Recent;   Good Remote;   Good  Judgement:  Good  Insight:  Good  Psychomotor Activity:  NA  Concentration:  Concentration: Good  Recall:  Good  Fund of Knowledge: Good  Language: Good  Akathisia:  Negative  Handed:  Right  AIMS (if indicated): not done  Assets:  Communication Skills Desire for Improvement Financial Resources/Insurance Housing Resilience  ADL's:  Intact  Cognition: WNL  Sleep:  Good     Assessment and Plan: 34yomarriedfemale with mixed anxiety disorder (GAD, panic disorder now improved) and night terrors which per her report triggered long standing alcohol drinking  problem. She hadliver cirrhosis and finally had liver transplant at [facility] on 08/22/19. She had some complications afterwards - kidney failure with delirium- and continues to have a difficult and protracted recovery with diarrhea, nausea, pain. Her sleep improved with clonazepam but anxiety has worsened initially.SRitalin has not been continued (for fatigue) and she feels it is not needed.In remission of alcohol use disorder since November 2019.Her husband however drinks daily! She has a Social worker through [employer]. She denies craving alcohol. She has tried and failedmultiple SSRIs/SNRIs prescribed in the past for anxiety but admittedly was also heavily drinking at the time. She has been on benzodiazepines (alprazolam) for over 15 years - uses it daily most of the time 1-2 x. She denies feeling depressed, suicidal or having nightmares. She does however struggle with chronic fatigue.    Dx: GAD, Nightmare disorder, resolved; Alcohol use disorder severe in remission  Plan: Continue alprazolam1mg prn anxietyandclonazepam 1 mg for insomnia.  We will try bupropion XL 150 mg for fatigue. Next appointment in one month.The plan was discussed with patient who had an opportunity to ask questions and these were all answered. I spend84minutes inphone consultation with the patient.   Stephanie Acre, MD 05/04/2020, 4:14 PM

## 2020-05-12 ENCOUNTER — Other Ambulatory Visit (HOSPITAL_COMMUNITY): Payer: Self-pay | Admitting: Psychiatry

## 2020-05-12 ENCOUNTER — Telehealth (HOSPITAL_COMMUNITY): Payer: Self-pay | Admitting: *Deleted

## 2020-05-12 MED ORDER — ESZOPICLONE 2 MG PO TABS: 2.0000 mg | ORAL_TABLET | Freq: Every evening | ORAL | 2 refills | Status: DC | PRN

## 2020-05-12 NOTE — Telephone Encounter (Signed)
I ordered Lunesta although I remember that she complained that it was not that effective.

## 2020-05-12 NOTE — Telephone Encounter (Signed)
Ok. Thank you.

## 2020-05-12 NOTE — Telephone Encounter (Signed)
Writer spoke with pt regarding her message that she has d/c the Wellbutrin due to s/e. Pt states that it made her more depressed, irritable, and anxious. Pt acknowledged some passive si while taking the Wellbutrin. Pt aslo had c/o decreased sleep and wondering if you would approve starting Lunesta again. Please review and advise. Thanks.

## 2020-05-25 ENCOUNTER — Telehealth (HOSPITAL_COMMUNITY): Payer: 59 | Admitting: Psychiatry

## 2020-05-27 ENCOUNTER — Other Ambulatory Visit (HOSPITAL_COMMUNITY): Payer: Self-pay | Admitting: Psychiatry

## 2020-06-07 ENCOUNTER — Telehealth (INDEPENDENT_AMBULATORY_CARE_PROVIDER_SITE_OTHER): Payer: 59 | Admitting: Psychiatry

## 2020-06-07 ENCOUNTER — Other Ambulatory Visit: Payer: Self-pay

## 2020-06-07 DIAGNOSIS — F411 Generalized anxiety disorder: Secondary | ICD-10-CM

## 2020-06-07 NOTE — Progress Notes (Signed)
[place] MD/PA/NP OP Progress Note  06/07/2020 4:13 PM Patricia Lutz  MRN:  [id number] Interview was conducted by phone and I verified that I was speaking with the correct person using two identifiers. I discussed the limitations of evaluation and management by telemedicine and  the availability of in person appointments. Patient expressed understanding and agreed to proceed. Participants in the visit: patient (location - home); physician (location - home office).  Chief Complaint:  Anxiety, insomnia.  HPI: 34yomarriedfemale with mixed anxiety disorder (GAD, panic disorder now improved) and night terrors which per her report triggered long standing alcohol drinking problem. She hadliver cirrhosis and finally had liver transplant at [facility] on 08/22/19. She had some complications afterwards - kidney failure with delirium- and continues to have a difficult and protracted recovery with diarrhea, nausea, pain. Her sleep improved with clonazepam but anxiety has worsenedinitially.SRitalin has not been continued (for fatigue) and she feels it is not needed.In remission of alcohol use disorder since November 2019.Her husband however drinks daily!Shehas a Social worker through [employer].She denies craving alcohol. She has tried and failedmultiple SSRIs/SNRIs prescribed in the past for anxiety but admittedly was also heavily drinking at the time. She has been on benzodiazepines(alprazolam)for over 15 years- uses it daily most of the time 1-2 x. She denies feeling depressed, suicidal or havingnightmares. She does however struggle with insomnia. We have restarted eszopiclone 2 mg as clonazepam does not seem to be effective anymore. We tried bupropion 150 mg for chronic fatigue but she started to have SI within 3 days of starting it and stopped (SI quickly resolved).     Visit Diagnosis:    ICD-10-CM   1. GAD (generalized anxiety disorder)  F41.1     Past Psychiatric History: Please see intake  H&P.  Past Medical History:  Past Medical History:  Diagnosis Date  . Anxiety   . Cirrhosis ([place])   . Depression   . GERD (gastroesophageal reflux disease)   . Hashimoto's disease   . History of stomach ulcers   . Hypothyroidism   . Substance abuse ([place])    alcohol    Past Surgical History:  Procedure Laterality Date  . CESAREAN SECTION      Family Psychiatric History: None.  Family History:  Family History  Problem Relation Age of Onset  . Crohn's disease Mother   . Crohn's disease Maternal Grandmother   . Diabetes Brother     Social History:  Social History   Socioeconomic History  . Marital status: Unknown    Spouse name: Not on file  . Number of children: Not on file  . Years of education: Not on file  . Highest education level: Not on file  Occupational History  . Not on file  Tobacco Use  . Smoking status: Never Smoker  . Smokeless tobacco: Never Used  Vaping Use  . Vaping Use: Never used  Substance and Sexual Activity  . Alcohol use: Not Currently    Alcohol/week: 0.0 standard drinks  . Drug use: No  . Sexual activity: Not on file  Other Topics Concern  . Not on file  Social History Narrative  . Not on file   Social Determinants of Health   Financial Resource Strain: Not on file  Food Insecurity: Not on file  Transportation Needs: Not on file  Physical Activity: Not on file  Stress: Not on file  Social Connections: Not on file    Allergies: No Known Allergies  Metabolic Disorder Labs: No results found for: HGBA1C,  MPG No results found for: PROLACTIN No results found for: CHOL, TRIG, HDL, CHOLHDL, VLDL, LDLCALC No results found for: TSH  Therapeutic Level Labs: No results found for: LITHIUM No results found for: VALPROATE No components found for:  CBMZ  Current Medications: Current Outpatient Medications  Medication Sig Dispense Refill  . ALPRAZolam (XANAX) 1 MG tablet Take 1 tablet (1 mg total) by mouth 3 (three) times daily as  needed for anxiety. 90 tablet 2  . clonazePAM (KLONOPIN) 1 MG tablet TAKE 1 TABLET (1 MG TOTAL) BY MOUTH AT BEDTIME AS NEEDED (SLEEP). 30 tablet 2  . eszopiclone (LUNESTA) 2 MG TABS tablet Take 1 tablet (2 mg total) by mouth at bedtime as needed for sleep. Take immediately before bedtime 30 tablet 2  . furosemide (LASIX) 20 MG tablet Take 40 mg by mouth 2 (two) times daily.    Marland Kitchen levothyroxine (SYNTHROID, LEVOTHROID) 125 MCG tablet Take 125 mcg by mouth daily before breakfast.     . ondansetron (ZOFRAN-ODT) 4 MG disintegrating tablet Take 4 mg by mouth every 8 (eight) hours as needed for nausea or vomiting.     . pantoprazole (PROTONIX) 40 MG tablet Take 1 tablet (40 mg total) by mouth 2 (two) times daily. (Patient taking differently: Take 40 mg by mouth daily. ) 60 tablet 0  . spironolactone (ALDACTONE) 50 MG tablet Take 100 mg by mouth daily.      No current facility-administered medications for this visit.       Psychiatric Specialty Exam: Review of Systems  Psychiatric/Behavioral: Positive for sleep disturbance. The patient is nervous/anxious.   All other systems reviewed and are negative.   There were no vitals taken for this visit.There is no height or weight on file to calculate BMI.  General Appearance: NA  Eye Contact:  NA  Speech:  Clear and Coherent and Normal Rate  Volume:  Normal  Mood:  Anxious  Affect:  NA  Thought Process:  Goal Directed and Linear  Orientation:  Full (Time, Place, and Person)  Thought Content: Logical   Suicidal Thoughts:  No  Homicidal Thoughts:  No  Memory:  Immediate;   Good Recent;   Good Remote;   Good  Judgement:  Good  Insight:  Good  Psychomotor Activity:  NA  Concentration:  Concentration: Good  Recall:  Good  Fund of Knowledge: Good  Language: Good  Akathisia:  Negative  Handed:  Right  AIMS (if indicated): not done  Assets:  Communication Skills Desire for Improvement Financial Resources/Insurance Housing Resilience  ADL's:   Intact  Cognition: WNL  Sleep:  Fair    Assessment and Plan: 34yomarriedfemale with mixed anxiety disorder (GAD, panic disorder now improved) and night terrors which per her report triggered long standing alcohol drinking problem. She hadliver cirrhosis and finally had liver transplant at [facility] on 08/22/19. She had some complications afterwards - kidney failure with delirium- and continues to have a difficult and protracted recovery with diarrhea, nausea, pain. Her sleep improved with clonazepam but anxiety has worsenedinitially.SRitalin has not been continued (for fatigue) and she feels it is not needed.In remission of alcohol use disorder since November 2019.Her husband however drinks daily!Shehas a Social worker through [employer].She denies craving alcohol. She has tried and failedmultiple SSRIs/SNRIs prescribed in the past for anxiety but admittedly was also heavily drinking at the time. She has been on benzodiazepines(alprazolam)for over 15 years- uses it daily most of the time 1-2 x. She denies feeling depressed, suicidal or havingnightmares. She does  however struggle with insomnia. We have restarted eszopiclone 2 mg as clonazepam does not seem to be effective anymore. We tried bupropion 150 mg for chronic fatigue but she started to have SI within 3 days of starting it and stopped (SI quickly resolved).    Dx: GAD, Nightmare disorder, resolved; Alcohol use disorder severe in sustained remission  Plan: Continue alprazolam'1mg'$ prn anxietyandeszopiclone 2 mg for insomnia. Next appointment in two months.The plan was discussed with patient who had an opportunity to ask questions and these were all answered. I spend67mnutes inphone consultation with the patient.   OStephanie Acre MD 06/07/2020, 4:13 PM

## 2020-06-22 ENCOUNTER — Other Ambulatory Visit (HOSPITAL_COMMUNITY): Payer: Self-pay | Admitting: Psychiatry

## 2020-08-10 ENCOUNTER — Telehealth (INDEPENDENT_AMBULATORY_CARE_PROVIDER_SITE_OTHER): Payer: 59 | Admitting: Psychiatry

## 2020-08-10 ENCOUNTER — Other Ambulatory Visit: Payer: Self-pay

## 2020-08-10 DIAGNOSIS — F411 Generalized anxiety disorder: Secondary | ICD-10-CM

## 2020-08-10 DIAGNOSIS — F410 Panic disorder [episodic paroxysmal anxiety] without agoraphobia: Secondary | ICD-10-CM | POA: Diagnosis not present

## 2020-08-10 MED ORDER — CLONAZEPAM 2 MG PO TABS: 2.0000 mg | ORAL_TABLET | Freq: Every day | ORAL | 2 refills | Status: DC

## 2020-08-10 MED ORDER — ALPRAZOLAM 1 MG PO TABS: 1.0000 mg | ORAL_TABLET | Freq: Four times a day (QID) | ORAL | 2 refills | Status: AC | PRN

## 2020-08-10 NOTE — Progress Notes (Signed)
[place] MD/PA/NP OP Progress Note  08/10/2020 4:15 PM Patricia Lutz  MRN:  [id number] Interview was conducted by phone and I verified that I was speaking with the correct person using two identifiers. I discussed the limitations of evaluation and management by telemedicine and  the availability of in person appointments. Patient expressed understanding and agreed to proceed. Participants in the visit: patient (location - home); physician (location - home office).  Chief Complaint: Panic attacks, insomnia.  HPI: 34yomarriedfemale with mixed anxiety disorder (GAD, panic disorder now improved) and night terrors which per her report triggered long standing alcohol drinking problem. She hadliver cirrhosis and finally had liver transplant at [facility] on 08/22/19. She had some complications afterwards - kidney failure with delirium- and continues to have a difficult and protracted recovery with diarrhea, nausea, pain. Her sleep improved with clonazepam but anxiety has worsenedinitially.SRitalin has not been continued (for fatigue) and she feels it is not needed.In remission of alcohol use disorder since November 2019.Her husband however drinks daily!Shehas a Social worker through [employer].She denies craving alcohol. She has tried and failedmultiple SSRIs/SNRIs prescribed in the past for anxiety but admittedly was also heavily drinking at the time. She has been on benzodiazepines(alprazolam)for over 15 years- uses it daily but despite that still has panic attacks 2-3 x per week. She denies feeling depressed, suicidal or havingnightmares.She does however struggle with insomnia. We have restarted eszopiclone 2 mg but it does not help and she feels it makes her more anxious? We tried bupropion 150 mg for chronic fatigue but she started to have SI within 3 days of starting it and stopped (SI quickly resolved).   Visit Diagnosis:    ICD-10-CM   1. GAD (generalized anxiety disorder)  F41.1   2.  Panic disorder  F41.0     Past Psychiatric History: Please see intake H&P.  Past Medical History:  Past Medical History:  Diagnosis Date  . Anxiety   . Cirrhosis ([place])   . Depression   . GERD (gastroesophageal reflux disease)   . Hashimoto's disease   . History of stomach ulcers   . Hypothyroidism   . Substance abuse ([place])    alcohol    Past Surgical History:  Procedure Laterality Date  . CESAREAN SECTION      Family Psychiatric History: None.  Family History:  Family History  Problem Relation Age of Onset  . Crohn's disease Mother   . Crohn's disease Maternal Grandmother   . Diabetes Brother     Social History:  Social History   Socioeconomic History  . Marital status: Unknown    Spouse name: Not on file  . Number of children: Not on file  . Years of education: Not on file  . Highest education level: Not on file  Occupational History  . Not on file  Tobacco Use  . Smoking status: Never Smoker  . Smokeless tobacco: Never Used  Vaping Use  . Vaping Use: Never used  Substance and Sexual Activity  . Alcohol use: Not Currently    Alcohol/week: 0.0 standard drinks  . Drug use: No  . Sexual activity: Not on file  Other Topics Concern  . Not on file  Social History Narrative  . Not on file   Social Determinants of Health   Financial Resource Strain: Not on file  Food Insecurity: Not on file  Transportation Needs: Not on file  Physical Activity: Not on file  Stress: Not on file  Social Connections: Not on file  Allergies: No Known Allergies  Metabolic Disorder Labs: No results found for: HGBA1C, MPG No results found for: PROLACTIN No results found for: CHOL, TRIG, HDL, CHOLHDL, VLDL, LDLCALC No results found for: TSH  Therapeutic Level Labs: No results found for: LITHIUM No results found for: VALPROATE No components found for:  CBMZ  Current Medications: Current Outpatient Medications  Medication Sig Dispense Refill  . clonazePAM  (KLONOPIN) 2 MG tablet Take 1 tablet (2 mg total) by mouth at bedtime. 30 tablet 2  . ALPRAZolam (XANAX) 1 MG tablet Take 1 tablet (1 mg total) by mouth 4 (four) times daily as needed for anxiety. 120 tablet 2  . furosemide (LASIX) 20 MG tablet Take 40 mg by mouth 2 (two) times daily.    Marland Kitchen levothyroxine (SYNTHROID, LEVOTHROID) 125 MCG tablet Take 125 mcg by mouth daily before breakfast.     . ondansetron (ZOFRAN-ODT) 4 MG disintegrating tablet Take 4 mg by mouth every 8 (eight) hours as needed for nausea or vomiting.     . pantoprazole (PROTONIX) 40 MG tablet Take 1 tablet (40 mg total) by mouth 2 (two) times daily. (Patient taking differently: Take 40 mg by mouth daily. ) 60 tablet 0  . spironolactone (ALDACTONE) 50 MG tablet Take 100 mg by mouth daily.      No current facility-administered medications for this visit.     Psychiatric Specialty Exam: Review of Systems  Psychiatric/Behavioral: Positive for sleep disturbance. The patient is nervous/anxious.   All other systems reviewed and are negative.   There were no vitals taken for this visit.There is no height or weight on file to calculate BMI.  General Appearance: NA  Eye Contact:  NA  Speech:  Clear and Coherent and Normal Rate  Volume:  Normal  Mood:  Anxious  Affect:  NA  Thought Process:  Goal Directed  Orientation:  Full (Time, Place, and Person)  Thought Content: Logical   Suicidal Thoughts:  No  Homicidal Thoughts:  No  Memory:  Immediate;   Good Recent;   Good Remote;   Good  Judgement:  Good  Insight:  Good  Psychomotor Activity:  NA  Concentration:  Concentration: Good  Recall:  Good  Fund of Knowledge: Good  Language: Good  Akathisia:  Negative  Handed:  Right  AIMS (if indicated): not done  Assets:  Communication Skills Desire for Improvement Financial Resources/Insurance Housing  ADL's:  Intact  Cognition: WNL  Sleep:  Fair    Assessment and Plan: 34yomarriedfemale with mixed anxiety disorder  (GAD, panic disorder now improved) and night terrors which per her report triggered long standing alcohol drinking problem. She hadliver cirrhosis and finally had liver transplant at [facility] on 08/22/19. She had some complications afterwards - kidney failure with delirium- and continues to have a difficult and protracted recovery with diarrhea, nausea, pain. Her sleep improved with clonazepam but anxiety has worsenedinitially.SRitalin has not been continued (for fatigue) and she feels it is not needed.In remission of alcohol use disorder since November 2019.Her husband however drinks daily!Shehas a Social worker through [employer].She denies craving alcohol. She has tried and failedmultiple SSRIs/SNRIs prescribed in the past for anxiety but admittedly was also heavily drinking at the time. She has been on benzodiazepines(alprazolam)for over 15 years- uses it daily but despite that still has panic attacks 2-3 x per week. She denies feeling depressed, suicidal or havingnightmares.She does however struggle with insomnia. We have restarted eszopiclone 2 mg but it does not help and she feels it makes  her more anxious? We tried bupropion 150 mg for chronic fatigue but she started to have SI within 3 days of starting it and stopped (SI quickly resolved).  Dx: GAD/Panic disorder, Nightmare disorder, resolved; Alcohol use disorder severe in sustained remission  Plan: Continue alprazolam'1mg'$ prn anxietyandclonazepam 2 mg at HS for insomnia.Next appointment intwo months.The plan was discussed with patient who had an opportunity to ask questions and these were all answered. I spend13mnutes inphone consultation with the patient.   OStephanie Acre MD 08/10/2020, 4:15 PM

## 2020-09-14 ENCOUNTER — Telehealth (HOSPITAL_COMMUNITY): Payer: Self-pay | Admitting: *Deleted

## 2020-09-14 NOTE — Telephone Encounter (Signed)
Thanks you!

## 2020-09-14 NOTE — Telephone Encounter (Signed)
This former pt of Dr. Montel Culver called stating that she spilled bottle of Xanax, written on 08/10/20, and would like to get an early refill. Pt has not been assigned a new provider but front desk is going to call her today with appointment. Pt does not have a h/o asking for early fills. She states she was able to salvage #28 pills. Please review and advise. Thank you.

## 2020-09-14 NOTE — Telephone Encounter (Signed)
Pls schedule a new provider appointment and then we can discuss next step . Thank you . Please let me know.

## 2020-09-15 ENCOUNTER — Other Ambulatory Visit: Payer: Self-pay

## 2020-09-15 ENCOUNTER — Telehealth (INDEPENDENT_AMBULATORY_CARE_PROVIDER_SITE_OTHER): Payer: 59 | Admitting: Psychiatry

## 2020-09-15 ENCOUNTER — Encounter (HOSPITAL_COMMUNITY): Payer: Self-pay | Admitting: Psychiatry

## 2020-09-15 DIAGNOSIS — F1021 Alcohol dependence, in remission: Secondary | ICD-10-CM | POA: Diagnosis not present

## 2020-09-15 DIAGNOSIS — F411 Generalized anxiety disorder: Secondary | ICD-10-CM

## 2020-09-15 NOTE — Progress Notes (Signed)
[facility] MD/PA/NP OP Progress Note  09/15/2020 3:02 PM Patricia Lutz  MRN:  [id number] Interview was conducted by phone and I verified that I was speaking with the correct person using two identifiers. I discussed the limitations of evaluation and management by telemedicine and  the availability of in person appointments. Patient expressed understanding and agreed to proceed. Participants in the visit: patient (location - home); physician (location - home office).  Chief Complaint:  Panic attacks  HPI: 35 yo married female with mixed anxiety disorder (GAD, panic disorder now improved) and night terrors and previously under the care of Dr.Pucilowska who is no longer with [facility]. Patient today states she is doing well. Her anxiety and mood are good. She says that she has been on a lot of medications but the only medications that work for her are benzodiazepines. She takes alprazolam '1mg'$  that she takes 4 times daily, and Klonopin '2mg'$  at bedtime. 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 which per her report triggered long standing alcohol drinking problem. She had liver cirrhosis and finally had liver transplant at [facility] on 08/22/19. She had some complications afterwards - kidney failure with delirium - and continues to have a difficult and protracted recovery with diarrhea, nausea, pain. Her sleep improved with clonazepam but anxiety has worsened initially.  Per Dr.Pucilowska, from his last visit with patient,  Ritalin has not been continued (for fatigue) and she feels it is not needed. In remission of alcohol use disorder since November 2019. Her husband however drinks daily! She has a Social worker through [employer]. She denies craving alcohol. She has tried and failed multiple SSRIs/SNRIs prescribed in the past for anxiety but admittedly was also heavily drinking at the time. She has been on benzodiazepines (alprazolam) for over 15 years - uses it daily but despite that still has panic attacks 2-3  x per week. She denies feeling depressed, suicidal or having nightmares. She does however struggle with insomnia. We have restarted eszopiclone 2 mg but it does not help and she feels it makes her more anxious? We tried bupropion 150 mg for chronic fatigue but she started to have SI within 3 days of starting it and stopped (SI quickly resolved).      Visit Diagnosis:    ICD-10-CM   1. GAD (generalized anxiety disorder)  F41.1   2. Alcohol use disorder, moderate, in sustained remission (HCC)  F10.21     Past Psychiatric History: Please see intake H&P.  Past Medical History:  Past Medical History:  Diagnosis Date   Anxiety    Cirrhosis ([place])    Depression    GERD (gastroesophageal reflux disease)    Hashimoto's disease    History of stomach ulcers    Hypothyroidism    Substance abuse ([place])    alcohol    Past Surgical History:  Procedure Laterality Date   CESAREAN SECTION      Family Psychiatric History: None.  Family History:  Family History  Problem Relation Age of Onset   Crohn's disease Mother    Crohn's disease Maternal Grandmother    Diabetes Brother     Social History:  Social History   Socioeconomic History   Marital status: Unknown    Spouse name: Not on file   Number of children: Not on file   Years of education: Not on file   Highest education level: Not on file  Occupational History   Not on file  Tobacco Use   Smoking status: Never Smoker  Smokeless tobacco: Never Used  Vaping Use   Vaping Use: Never used  Substance and Sexual Activity   Alcohol use: Not Currently    Alcohol/week: 0.0 standard drinks   Drug use: No   Sexual activity: Not on file  Other Topics Concern   Not on file  Social History Narrative   Not on file   Social Determinants of Health   Financial Resource Strain: Not on file  Food Insecurity: Not on file  Transportation Needs: Not on file  Physical Activity: Not on file  Stress: Not on file  Social Connections: Not on  file    Allergies: No Known Allergies  Metabolic Disorder Labs: No results found for: HGBA1C, MPG No results found for: PROLACTIN No results found for: CHOL, TRIG, HDL, CHOLHDL, VLDL, LDLCALC No results found for: TSH  Therapeutic Level Labs: No results found for: LITHIUM No results found for: VALPROATE No components found for:  CBMZ  Current Medications: Current Outpatient Medications  Medication Sig Dispense Refill   ALPRAZolam (XANAX) 1 MG tablet Take 1 tablet (1 mg total) by mouth 4 (four) times daily as needed for anxiety. 120 tablet 2   clonazePAM (KLONOPIN) 2 MG tablet Take 1 tablet (2 mg total) by mouth at bedtime. 30 tablet 2   furosemide (LASIX) 20 MG tablet Take 40 mg by mouth 2 (two) times daily.     levothyroxine (SYNTHROID, LEVOTHROID) 125 MCG tablet Take 125 mcg by mouth daily before breakfast.      ondansetron (ZOFRAN-ODT) 4 MG disintegrating tablet Take 4 mg by mouth every 8 (eight) hours as needed for nausea or vomiting.      pantoprazole (PROTONIX) 40 MG tablet Take 1 tablet (40 mg total) by mouth 2 (two) times daily. (Patient taking differently: Take 40 mg by mouth daily. ) 60 tablet 0   spironolactone (ALDACTONE) 50 MG tablet Take 100 mg by mouth daily.      No current facility-administered medications for this visit.     Psychiatric Specialty Exam: Review of Systems  Psychiatric/Behavioral: Positive for sleep disturbance. The patient is nervous/anxious.   All other systems reviewed and are negative.   There were no vitals taken for this visit.There is no height or weight on file to calculate BMI.  General Appearance: NA  Eye Contact:  NA  Speech:  Clear and Coherent and Normal Rate  Volume:  Normal  Mood:  Anxious  Affect:  NA  Thought Process:  Goal Directed  Orientation:  Full (Time, Place, and Person)  Thought Content: Logical   Suicidal Thoughts:  No  Homicidal Thoughts:  No  Memory:  Immediate;   Good Recent;   Good Remote;   Good   Judgement:  Good  Insight:  Good  Psychomotor Activity:  NA  Concentration:  Concentration: Good  Recall:  Good  Fund of Knowledge: Good  Language: Good  Akathisia:  Negative  Handed:  Right  AIMS (if indicated): not done  Assets:  Communication Skills Desire for Improvement Financial Resources/Insurance Housing  ADL's:  Intact  Cognition: WNL  Sleep:  Fair    Assessment and Plan: 35 yo married female with mixed anxiety disorder (GAD, panic disorder now improved) and night terrors which per her report triggered long standing alcohol drinking problem. She had liver cirrhosis and finally had liver transplant at [facility] on 08/22/19. She had some complications afterwards - kidney failure with delirium - and continues to have a difficult and protracted recovery with diarrhea, nausea, pain. Her sleep  improved with clonazepam but anxiety has worsened initially. SRitalin has not been continued (for fatigue) and she feels it is not needed. In remission of alcohol use disorder since November 2019. Her husband however drinks daily! She has a Social worker through [employer]. She denies craving alcohol. She has tried and failed multiple SSRIs/SNRIs prescribed in the past for anxiety but admittedly was also heavily drinking at the time. She has been on benzodiazepines (alprazolam) for over 15 years - uses it daily but despite that still has panic attacks 2-3 x per week. She denies feeling depressed, suicidal or having nightmares. She does however struggle with insomnia. We have restarted eszopiclone 2 mg but it does not help and she feels it makes her more anxious? We tried bupropion 150 mg for chronic fatigue but she started to have SI within 3 days of starting it and stopped (SI quickly resolved).     Dx: GAD/Panic disorder, Nightmare disorder, resolved; Alcohol use disorder severe in sustained remission   Plan: Continue alprazolam 1 mg prn anxiety and clonazepam 2 mg at HS for insomnia. Next  appointment in two months. Patient has prescriptions from [place] for 2 months. It was discussed with her that this clinician does not prescribe benzodiazepines long term(not evidence based therapy) and she will have to follow up with a different physician since her only medications are benzodiazepines.Patient is agreeable. She will be sent a termination notice and given options to follow up with other providers.  The plan was discussed with patient who had an opportunity to ask questions and these were all answered. I spend 15 minutes in phone consultation with the patient. I spent 20 minutes reviewing chart.   Elvin So, MD 09/15/2020, 3:02 PM

## 2020-11-12 ENCOUNTER — Emergency Department (HOSPITAL_COMMUNITY): Payer: Managed Care, Other (non HMO)

## 2020-11-12 ENCOUNTER — Emergency Department (HOSPITAL_COMMUNITY)
Admission: EM | Admit: 2020-11-12 | Discharge: 2020-11-12 | Disposition: A | Discharge status: Home / Self Care | Payer: Managed Care, Other (non HMO) | Attending: Emergency Medicine | Admitting: Emergency Medicine

## 2020-11-12 ENCOUNTER — Other Ambulatory Visit: Payer: Self-pay

## 2020-11-12 ENCOUNTER — Encounter (HOSPITAL_COMMUNITY): Payer: Self-pay | Admitting: Radiology

## 2020-11-12 DIAGNOSIS — K219 Gastro-esophageal reflux disease without esophagitis: Secondary | ICD-10-CM | POA: Diagnosis not present

## 2020-11-12 DIAGNOSIS — R0602 Shortness of breath: Secondary | ICD-10-CM

## 2020-11-12 DIAGNOSIS — Z79899 Other long term (current) drug therapy: Secondary | ICD-10-CM | POA: Insufficient documentation

## 2020-11-12 DIAGNOSIS — E039 Hypothyroidism, unspecified: Secondary | ICD-10-CM | POA: Insufficient documentation

## 2020-11-12 DIAGNOSIS — R109 Unspecified abdominal pain: Secondary | ICD-10-CM | POA: Diagnosis present

## 2020-11-12 LAB — CBC WITH DIFFERENTIAL/PLATELET
Abs Immature Granulocytes: 0.03 10*3/uL (ref 0.00–0.07)
Basophils Absolute: 0 10*3/uL (ref 0.0–0.1)
Basophils Relative: 0 %
Eosinophils Absolute: 0.2 10*3/uL (ref 0.0–0.5)
Eosinophils Relative: 2 %
HCT: 39 % (ref 36.0–46.0)
Hemoglobin: 12.5 g/dL (ref 12.0–15.0)
Immature Granulocytes: 0 %
Lymphocytes Relative: 23 %
Lymphs Abs: 2.2 10*3/uL (ref 0.7–4.0)
MCH: 29.5 pg (ref 26.0–34.0)
MCHC: 32.1 g/dL (ref 30.0–36.0)
MCV: 92 fL (ref 80.0–100.0)
Monocytes Absolute: 0.8 10*3/uL (ref 0.1–1.0)
Monocytes Relative: 8 %
Neutro Abs: 6.2 10*3/uL (ref 1.7–7.7)
Neutrophils Relative %: 67 %
Platelets: 149 10*3/uL — ABNORMAL LOW (ref 150–400)
RBC: 4.24 MIL/uL (ref 3.87–5.11)
RDW: 16.1 % — ABNORMAL HIGH (ref 11.5–15.5)
WBC: 9.4 10*3/uL (ref 4.0–10.5)
nRBC: 0 % (ref 0.0–0.2)

## 2020-11-12 LAB — COMPREHENSIVE METABOLIC PANEL
ALT: 16 U/L (ref 0–44)
AST: 14 U/L — ABNORMAL LOW (ref 15–41)
Albumin: 3.5 g/dL (ref 3.5–5.0)
Alkaline Phosphatase: 60 U/L (ref 38–126)
Anion gap: 7 (ref 5–15)
BUN: 19 mg/dL (ref 6–20)
CO2: 27 mmol/L (ref 22–32)
Calcium: 9.2 mg/dL (ref 8.9–10.3)
Chloride: 105 mmol/L (ref 98–111)
Creatinine, Ser: 1.04 mg/dL — ABNORMAL HIGH (ref 0.44–1.00)
GFR, Estimated: >60 mL/min (ref >60)
Glucose, Bld: 111 mg/dL — ABNORMAL HIGH (ref 70–99)
Potassium: 4.1 mmol/L (ref 3.5–5.1)
Sodium: 139 mmol/L (ref 135–145)
Total Bilirubin: 0.8 mg/dL (ref 0.3–1.2)
Total Protein: 6.4 g/dL — ABNORMAL LOW (ref 6.5–8.1)

## 2020-11-12 LAB — HCG, QUANTITATIVE, PREGNANCY: hCG, Beta Chain, Quant, S: <1 m[IU]/mL (ref <5)

## 2020-11-12 MED ORDER — IOHEXOL 300 MG/ML  SOLN
100.0000 mL | Freq: Once | INTRAMUSCULAR | Status: AC | PRN
  Administered 2020-11-12: 100 mL via INTRAVENOUS

## 2020-11-12 MED ORDER — ONDANSETRON HCL 4 MG/2ML IJ SOLN
4.0000 mg | Freq: Once | INTRAMUSCULAR | Status: AC
  Administered 2020-11-12: 4 mg via INTRAVENOUS
  Filled 2020-11-12: qty 2

## 2020-11-12 MED ORDER — OXYCODONE HCL 5 MG PO TABS
10.0000 mg | ORAL_TABLET | Freq: Once | ORAL | Status: AC
  Administered 2020-11-12: 10 mg via ORAL
  Filled 2020-11-12: qty 2

## 2020-11-12 MED ORDER — SODIUM CHLORIDE (PF) 0.9 % IJ SOLN
INTRAMUSCULAR | Status: AC
  Filled 2020-11-12: qty 50

## 2020-11-12 MED ORDER — HYDROMORPHONE HCL 1 MG/ML IJ SOLN: 1.0000 mg | Freq: Once | INTRAMUSCULAR | Status: DC

## 2020-11-12 NOTE — ED Provider Notes (Signed)
[facility] DEPT Provider Note   CSN: [id number] Arrival date & time: 11/12/20  0205     History Chief Complaint  Patient presents with   Shortness of Breath   Abdominal Pain    Patricia Lutz is a 35 y.o. female.  Long term h/o of feeling sob worse with lying down. Worked up multiple times in multiple places for same.     Shortness of Breath Severity:  Moderate Duration:  10 months Timing:  Intermittent Progression:  Waxing and waning Chronicity:  New Context: not activity and not known allergens   Relieved by:  Nothing Associated symptoms: abdominal pain   Abdominal Pain Associated symptoms: shortness of breath       Past Medical History:  Diagnosis Date   Anxiety    Cirrhosis ([place])    Depression    GERD (gastroesophageal reflux disease)    Hashimoto's disease    History of stomach ulcers    Hypothyroidism    Substance abuse ([place])    alcohol    Patient Active Problem List   Diagnosis Date Noted   Panic disorder 08/10/2020   GAD (generalized anxiety disorder) 07/12/2018   Nightmare disorder 07/12/2018   Alcohol use disorder, moderate, in sustained remission ([place]) 11/24/2015   Nausea without vomiting 11/24/2015   Abdominal pain, epigastric 11/24/2015   Diarrhea 11/24/2015   Poor appetite 11/24/2015   History of ulcer disease 11/24/2015    Past Surgical History:  Procedure Laterality Date   CESAREAN SECTION       OB History   No obstetric history on file.     Family History  Problem Relation Age of Onset   Crohn's disease Mother    Crohn's disease Maternal Grandmother    Diabetes Brother     Social History   Tobacco Use   Smoking status: Never   Smokeless tobacco: Never  Vaping Use   Vaping Use: Never used  Substance Use Topics   Alcohol use: Not Currently    Alcohol/week: 0.0 standard drinks   Drug use: No    Home Medications Prior to Admission medications   Medication Sig Start Date End Date  Taking? Authorizing Provider  clonazePAM (KLONOPIN) 2 MG tablet Take 1 tablet (2 mg total) by mouth at bedtime. 08/10/20 11/08/20  Pucilowski, Marchia Bond, MD  furosemide (LASIX) 20 MG tablet Take 40 mg by mouth 2 (two) times daily.    [provider]  levothyroxine (SYNTHROID, LEVOTHROID) 125 MCG tablet Take 125 mcg by mouth daily before breakfast.     [provider]  ondansetron (ZOFRAN-ODT) 4 MG disintegrating tablet Take 4 mg by mouth every 8 (eight) hours as needed for nausea or vomiting.  04/30/19   [provider]  pantoprazole (PROTONIX) 40 MG tablet Take 1 tablet (40 mg total) by mouth 2 (two) times daily. Patient taking differently: Take 40 mg by mouth daily.  05/24/16   Jola Schmidt, MD  spironolactone (ALDACTONE) 50 MG tablet Take 100 mg by mouth daily.  05/30/18 06/21/19  [provider]    Allergies    Patient has no known allergies.  Review of Systems   Review of Systems  Respiratory:  Positive for shortness of breath.   Gastrointestinal:  Positive for abdominal pain.  All other systems reviewed and are negative.  Physical Exam Updated Vital Signs BP (!) 137/96   Pulse 77   Temp 97.8 F (36.6 C) (Oral)   Resp 18   Ht '5\' 2"'$  (1.575 m)  Wt 90.7 kg   LMP 10/25/2020 Comment: negative HCG blood test 11-12-2020  SpO2 100%   BMI 36.58 kg/m   Physical Exam Vitals and nursing note reviewed.  Constitutional:      Appearance: She is well-developed.  HENT:     Head: Normocephalic and atraumatic.     Mouth/Throat:     Mouth: Mucous membranes are moist.     Pharynx: Oropharynx is clear.  Eyes:     Pupils: Pupils are equal, round, and reactive to light.  Cardiovascular:     Rate and Rhythm: Normal rate and regular rhythm.  Pulmonary:     Effort: No respiratory distress.     Breath sounds: No stridor. No decreased breath sounds, wheezing or rhonchi.  Abdominal:     General: There is no distension.  Musculoskeletal:        General: No  swelling or tenderness. Normal range of motion.     Cervical back: Normal range of motion.  Skin:    General: Skin is warm and dry.  Neurological:     General: No focal deficit present.     Mental Status: She is alert.    ED Results / Procedures / Treatments   Labs (all labs ordered are listed, but only abnormal results are displayed) Labs Reviewed  CBC WITH DIFFERENTIAL/PLATELET - Abnormal; Notable for the following components:      Result Value   RDW 16.1 (*)    Platelets 149 (*)    All other components within normal limits  COMPREHENSIVE METABOLIC PANEL - Abnormal; Notable for the following components:   Glucose, Bld 111 (*)    Creatinine, Ser 1.04 (*)    Total Protein 6.4 (*)    AST 14 (*)    All other components within normal limits  HCG, QUANTITATIVE, PREGNANCY    EKG None  Radiology CT ABDOMEN PELVIS W CONTRAST  Result Date: 11/12/2020 CLINICAL DATA:  Nausea, vomiting EXAM: CT ABDOMEN AND PELVIS WITH CONTRAST TECHNIQUE: Multidetector CT imaging of the abdomen and pelvis was performed using the standard protocol following bolus administration of intravenous contrast. CONTRAST:  11m OMNIPAQUE IOHEXOL 300 MG/ML  SOLN COMPARISON:  06/21/2019 FINDINGS: Lower chest: The visualized lung bases are clear bilaterally. The visualized heart and pericardium are unremarkable. Surgical staple line noted at the hepatocaval junction in keeping with given history of liver transplantation. 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 Hepatobiliary: Gallbladder absent. No focal intrahepatic mass identified. Normal enhancement of the hepatic parenchyma. No intra or extrahepatic biliary ductal dilation. The portal vein is relatively small in caliber, but is patent. The hepatic artery is not well assessed on this non arteriographic study. A 14 mm water attenuation cystic structures identified within the porta hepatis adjacent to, but separate from the a extrahepatic bile duct, possibly  representing a postoperative seroma. Pancreas: Unremarkable Spleen: The spleen is mildly enlarged measuring 13.5 cm in greatest dimension, improved since prior examination. No intrasplenic lesion identified. The splenic vein is patent. Adrenals/Urinary Tract: The adrenal glands are atrophic. The kidneys are unremarkable. The bladder is unremarkable. Stomach/Bowel: Stomach is within normal limits. Appendix appears normal. No evidence of bowel wall thickening, distention, or inflammatory changes. No free intraperitoneal gas or fluid. Vascular/Lymphatic: Multiple portosystemic collaterals are identified including an indirect splenorenal shunt as well as the retroperitoneum within the left periaortic region. These appear relatively decompressed when compared to prior examination. The abdominal vasculature is otherwise unremarkable. No pathologic adenopathy within the abdomen and pelvis. Reproductive: Intrauterine device in expected position. The  pelvic organs are otherwise unremarkable. Other: No abdominal wall hernia.  Rectum unremarkable. Musculoskeletal: No acute bone abnormality. No lytic or blastic bone lesion identified. IMPRESSION: Surgical changes in keeping with orthotopic liver transplantation. No evidence of surgical complication. Patency of the portal vein and normal enhancement of the hepatic parenchyma. The hepatic artery is not well assessed on this non arteriographic study. 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 No acute intra-abdominal pathology identified. No definite radiographic explanation for the patient's reported symptoms. Electronically Signed   By: Fidela Salisbury MD   On: 11/12/2020 06:27   DG Chest Portable 1 View  Result Date: 11/12/2020 CLINICAL DATA:  Dyspnea EXAM: PORTABLE CHEST 1 VIEW COMPARISON:  06/21/2019 FINDINGS: The heart size and mediastinal  contours are within normal limits. Both lungs are clear. The visualized skeletal structures are unremarkable. IMPRESSION: No active disease. Electronically Signed   By: Fidela Salisbury MD   On: 11/12/2020 04:22    Procedures Procedures   Medications Ordered in ED Medications  ondansetron [facility]) injection 4 mg (4 mg Intravenous Given 11/12/20 0420)  oxyCODONE (Oxy IR/ROXICODONE) immediate release tablet 10 mg (10 mg Oral Given 11/12/20 0421)  iohexol (OMNIPAQUE) 300 MG/ML solution 100 mL (100 mLs Intravenous Contrast Given 11/12/20 0557)    ED Course  I have reviewed the triage vital signs and the nursing notes.  Pertinent labs & imaging results that were available during my care of the patient were reviewed by me and considered in my medical decision making (see chart for details).    MDM Rules/Calculators/A&P                          No obvious cause for her symptoms here however this is chronic in nature and did not expect there to be a cause found, but no changes. Stable for dc to fu w/ pcp and team at [facility].   Final Clinical Impression(s) / ED Diagnoses Final diagnoses:  Shortness of breath  Abdominal pain, unspecified abdominal location    Rx / DC Orders ED Discharge Orders     None        Loralie Malta, Corene Cornea, MD 11/13/20 [phone]

## 2020-11-12 NOTE — ED Notes (Signed)
Pt discharged from this ED in stable condition at this time. All discharge instructions and follow up care reviewed with pt with no further questions at this time. Pt ambulatory with steady gait, clear speech.  

## 2020-11-12 NOTE — ED Triage Notes (Signed)
Pt came in with c/o SOB, abdominal pain and generalized weakness. 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 She has been having ongoing SOB since her diagnosis of Covid the beginning of the year. She has been tested for Covid long haul sx. Pt states her SOB has been worsening over the last few days. She states that she has been swelling all over and it is hard for her to walk due to SOB.

## 2021-01-07 ENCOUNTER — Emergency Department (HOSPITAL_COMMUNITY)
Admission: EM | Admit: 2021-01-07 | Discharge: 2021-01-08 | Disposition: A | Discharge status: Home / Self Care | Payer: Managed Care, Other (non HMO) | Attending: Emergency Medicine | Admitting: Emergency Medicine

## 2021-01-07 ENCOUNTER — Other Ambulatory Visit: Payer: Self-pay

## 2021-01-07 DIAGNOSIS — E039 Hypothyroidism, unspecified: Secondary | ICD-10-CM | POA: Insufficient documentation

## 2021-01-07 DIAGNOSIS — R509 Fever, unspecified: Secondary | ICD-10-CM | POA: Diagnosis not present

## 2021-01-07 DIAGNOSIS — R197 Diarrhea, unspecified: Secondary | ICD-10-CM | POA: Insufficient documentation

## 2021-01-07 DIAGNOSIS — R1084 Generalized abdominal pain: Secondary | ICD-10-CM

## 2021-01-07 DIAGNOSIS — Z20822 Contact with and (suspected) exposure to covid-19: Secondary | ICD-10-CM | POA: Insufficient documentation

## 2021-01-07 DIAGNOSIS — R531 Weakness: Secondary | ICD-10-CM | POA: Insufficient documentation

## 2021-01-07 LAB — HEPATIC FUNCTION PANEL
ALT: 20 U/L (ref 0–44)
AST: 18 U/L (ref 15–41)
Albumin: 4.1 g/dL (ref 3.5–5.0)
Alkaline Phosphatase: 56 U/L (ref 38–126)
Bilirubin, Direct: 0.3 mg/dL — ABNORMAL HIGH (ref 0.0–0.2)
Indirect Bilirubin: 1.6 mg/dL — ABNORMAL HIGH (ref 0.3–0.9)
Total Bilirubin: 1.9 mg/dL — ABNORMAL HIGH (ref 0.3–1.2)
Total Protein: 7.4 g/dL (ref 6.5–8.1)

## 2021-01-07 LAB — CBC WITH DIFFERENTIAL/PLATELET
Abs Immature Granulocytes: 0.05 10*3/uL (ref 0.00–0.07)
Basophils Absolute: 0 10*3/uL (ref 0.0–0.1)
Basophils Relative: 0 %
Eosinophils Absolute: 0 10*3/uL (ref 0.0–0.5)
Eosinophils Relative: 0 %
HCT: 43.3 % (ref 36.0–46.0)
Hemoglobin: 14.3 g/dL (ref 12.0–15.0)
Immature Granulocytes: 1 %
Lymphocytes Relative: 11 %
Lymphs Abs: 1.1 10*3/uL (ref 0.7–4.0)
MCH: 30.4 pg (ref 26.0–34.0)
MCHC: 33 g/dL (ref 30.0–36.0)
MCV: 92.1 fL (ref 80.0–100.0)
Monocytes Absolute: 0.6 10*3/uL (ref 0.1–1.0)
Monocytes Relative: 6 %
Neutro Abs: 8.7 10*3/uL — ABNORMAL HIGH (ref 1.7–7.7)
Neutrophils Relative %: 82 %
Platelets: 187 10*3/uL (ref 150–400)
RBC: 4.7 MIL/uL (ref 3.87–5.11)
RDW: 14.8 % (ref 11.5–15.5)
WBC: 10.5 10*3/uL (ref 4.0–10.5)
nRBC: 0 % (ref 0.0–0.2)

## 2021-01-07 LAB — URINALYSIS, ROUTINE W REFLEX MICROSCOPIC
Bilirubin Urine: NEGATIVE
Glucose, UA: NEGATIVE mg/dL
Hgb urine dipstick: NEGATIVE
Ketones, ur: NEGATIVE mg/dL
Leukocytes,Ua: NEGATIVE
Nitrite: NEGATIVE
Protein, ur: NEGATIVE mg/dL
Specific Gravity, Urine: 1.01 (ref 1.005–1.030)
pH: 6 (ref 5.0–8.0)

## 2021-01-07 LAB — BASIC METABOLIC PANEL
Anion gap: 10 (ref 5–15)
BUN: 13 mg/dL (ref 6–20)
CO2: 22 mmol/L (ref 22–32)
Calcium: 9.1 mg/dL (ref 8.9–10.3)
Chloride: 107 mmol/L (ref 98–111)
Creatinine, Ser: 0.84 mg/dL (ref 0.44–1.00)
GFR, Estimated: >60 mL/min (ref >60)
Glucose, Bld: 110 mg/dL — ABNORMAL HIGH (ref 70–99)
Potassium: 3.9 mmol/L (ref 3.5–5.1)
Sodium: 139 mmol/L (ref 135–145)

## 2021-01-07 LAB — LACTIC ACID, PLASMA: Lactic Acid, Venous: 1.6 mmol/L (ref 0.5–1.9)

## 2021-01-07 LAB — BRAIN NATRIURETIC PEPTIDE: B Natriuretic Peptide: 41.3 pg/mL (ref 0.0–100.0)

## 2021-01-07 LAB — HCG, QUANTITATIVE, PREGNANCY: hCG, Beta Chain, Quant, S: <1 m[IU]/mL (ref <5)

## 2021-01-07 NOTE — ED Provider Notes (Signed)
Emergency Department Provider Note   I have reviewed the triage vital signs and the nursing notes.   HISTORY  Chief Complaint Fever and Abdominal Pain (Liver transplant pt)   HPI Patricia Lutz is a 35 y.o. female with past medical history of cirrhosis status post liver transplant, mainly followed at [facility], presents to the emergency department with abdominal pain, reported fever at home, diarrhea.  Patient has had generalized weakness over the past several days.  She called the transplant team at [facility] who proposed to either coming to the [facility] emergency department for evaluation versus following with her endocrinology and transplant team on Monday.  She states that the [facility] emergency department weight is often very Salah Nakamura and so she came here for evaluation.  She states in addition to the above symptoms she has had headache along with nausea and poor appetite. Has history of biliary stricture and prior ERCP with stenting although nothing recent. Per chart review on 7/11 at [facility] she had an admit in July with similar presentation.  She underwent tissue biopsy for CMV and exploratory laparotomy with no clear etiology of abdominal pain. Ultimately patient's pain (acute on chronic, was managed and when was able to be discharged with pain mgmt referral.    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   Alcohol use disorder, moderate, in sustained remission ([place]) 11/24/2015   Nausea without vomiting 11/24/2015   Abdominal pain, epigastric 11/24/2015   Diarrhea 11/24/2015   Poor appetite 11/24/2015   History of ulcer disease 11/24/2015    Past  Surgical History:  Procedure Laterality Date   CESAREAN SECTION      Allergies Ibuprofen and Saline  Family History  Problem Relation Age of Onset   Crohn's disease Mother    Crohn's disease Maternal Grandmother    Diabetes Brother     Social History Social History   Tobacco Use   Smoking status: Never   Smokeless tobacco: Never  Vaping Use   Vaping Use: Never used  Substance Use Topics   Alcohol use: Not Currently    Alcohol/week: 0.0 standard drinks   Drug use: No    Review of Systems  Constitutional: Positive fever/chills. Positive fatigue.  Eyes: No visual changes. ENT: No sore throat. Cardiovascular: Denies chest pain. Respiratory: Denies shortness of breath. Gastrointestinal: Positive abdominal pain. Positive nausea, vomiting, and diarrhea.  No constipation. Genitourinary: Negative for dysuria. Musculoskeletal: Negative for back pain. Skin: Negative for rash. Neurological: Negative for headaches, focal weakness or numbness.  10-point ROS otherwise negative.  ____________________________________________   PHYSICAL EXAM:  VITAL SIGNS: ED Triage Vitals  Enc Vitals Group     BP 01/07/21 2045 (!) 150/103     Pulse Rate 01/07/21 2045 (!) 105     Resp 01/07/21 2045 20     Temp 01/07/21 2045 98.9 F (37.2 C)     Temp Source 01/07/21 2045 Oral     SpO2 01/07/21 2045 95 %     Weight 01/07/21 2045 200 lb (90.7 kg)     Height 01/07/21 2045 '5\' 2"'$  (1.575 m)  Constitutional: Alert and oriented. Well appearing and in no acute distress. Eyes: Conjunctivae are normal.  Head: Atraumatic. Nose: No congestion/rhinnorhea. Mouth/Throat: Mucous membranes are moist.   Neck: No stridor.   Cardiovascular: Normal rate, regular rhythm. Good peripheral circulation. Grossly normal heart sounds.   Respiratory: Normal respiratory effort.  No retractions. Lungs CTAB. Gastrointestinal: Soft and nontender. No distention. Well appearing ex-lap incisions. No peritoneal findings  on exam.  Musculoskeletal: No lower extremity tenderness nor edema. No gross deformities of extremities. Neurologic:  Normal speech and language. No gross focal neurologic deficits are appreciated.  Skin:  Skin is warm, dry and intact. No rash noted.  ____________________________________________   LABS (all labs ordered are listed, but only abnormal results are displayed)  Labs Reviewed  CBC WITH DIFFERENTIAL/PLATELET - Abnormal; Notable for the following components:      Result Value   Neutro Abs 8.7 (*)    All other components within normal limits  BASIC METABOLIC PANEL - Abnormal; Notable for the following components:   Glucose, Bld 110 (*)    All other components within normal limits  HEPATIC FUNCTION PANEL - Abnormal; Notable for the following components:   Total Bilirubin 1.9 (*)    Bilirubin, Direct 0.3 (*)    Indirect Bilirubin 1.6 (*)    All other components within normal limits  URINALYSIS, ROUTINE W REFLEX MICROSCOPIC - Abnormal; Notable for the following components:   APPearance HAZY (*)    All other components within normal limits  RESP PANEL BY RT-PCR (FLU A&B, COVID) ARPGX2  CULTURE, BLOOD (ROUTINE X 2)  CULTURE, BLOOD (ROUTINE X 2)  GASTROINTESTINAL PANEL BY PCR, STOOL (REPLACES STOOL CULTURE)  BRAIN NATRIURETIC PEPTIDE  LACTIC ACID, PLASMA  HCG, QUANTITATIVE, PREGNANCY    ____________________________________________  RADIOLOGY  CT ABDOMEN PELVIS W CONTRAST  Result Date: 01/08/2021 CLINICAL DATA:  Abdominal pain and fever. EXAM: CT ABDOMEN AND PELVIS WITH CONTRAST TECHNIQUE: Multidetector CT imaging of the abdomen and pelvis was performed using the standard protocol following bolus administration of intravenous contrast. CONTRAST:  29m OMNIPAQUE IOHEXOL 350 MG/ML SOLN COMPARISON:  CT of the abdomen pelvis dated 06/14/2020. FINDINGS: Lower chest: The visualized lung bases are clear. No intra-abdominal free air or free fluid. Hepatobiliary: The liver is  unremarkable.  Cholecystectomy. Pancreas: Unremarkable. No pancreatic ductal dilatation or surrounding inflammatory changes. Spleen: Normal in size without focal abnormality. Adrenals/Urinary Tract: The adrenal glands are unremarkable. The kidneys, visualized ureters, and the urinary bladder appear unremarkable. Stomach/Bowel: There is loose stool throughout the colon compatible with diarrheal state. Correlation with clinical exam and stool cultures recommended. Mild thickened appearance of the jejunal folds may represent enteritis. There is no bowel obstruction. The appendix is normal. Vascular/Lymphatic: The abdominal aorta and IVC are unremarkable. No portal venous gas. There is no adenopathy. Dilated left gonadal vein. Reproductive: The uterus is anteverted. An intrauterine device is noted. No adnexal masses. Other: None Musculoskeletal: No acute or significant osseous findings. IMPRESSION: Diarrheal state with possible mild enteritis. Correlation with clinical exam and stool cultures recommended. No bowel obstruction. Normal appendix. Electronically Signed   By: AAnner CreteM.D.   On: 01/08/2021 01:29    ____________________________________________   PROCEDURES  Procedure(s) performed:   Procedures  None  ____________________________________________   INITIAL IMPRESSION / ASSESSMENT AND PLAN / ED COURSE  Pertinent labs & imaging results that were available during my care of the patient were reviewed by me and considered in my medical decision making (see chart for details).   Patient presents  the emergency department with abdominal pain (acute on chronic) with past history of liver transplant followed primarily at [facility].  Her vital signs here are reassuring and not consistent with SIRS/Sepsis.  Her abdomen is diffusely soft and nontender.  Will follow CT imaging of the abdomen and pelvis given her fairly recent ex lap at [facility] but very low suspicion for complication from that procedure  which was done in mid July.  03:28 AM  Lab work reviewed along with CT imaging.  No signs to suspect sepsis.  Lactate is normal.  No leukocytosis.  COVID and flu are negative.  No urinary tract infection.  Obtain a CT scan for further evaluation showing diarrheal state but no acute finding or postoperative complication.  Patient not having diarrhea here in the emergency department and has been unable to give [place] a sample for C. difficile and stool culture/testing.  Plan for blood cultures.  Discussed with the [facility] transplant physician on-call who is actually the patient's physician there at [facility] and familiar with the case.  She can facilitate follow-up on an outpatient basis.  Does not see an indication for additional testing either through our emergency department or transfer/admission to [facility].  Discussed this with the patient who will continue supportive care at home and keep her follow-up appointments along with pain management plan at home.  ____________________________________________  FINAL CLINICAL IMPRESSION(S) / ED DIAGNOSES  Final diagnoses:  Generalized abdominal pain  Generalized weakness    MEDICATIONS GIVEN DURING THIS VISIT:  Medications  iohexol (OMNIPAQUE) 350 MG/ML injection 80 mL (80 mLs Intravenous Contrast Given 01/08/21 0102)    Note:  This document was prepared using Dragon voice recognition software and may include unintentional dictation errors.  Nanda Quinton, MD, [facility]    Blayke Cordrey, Wonda Olds, MD 01/08/21 0330

## 2021-01-07 NOTE — ED Triage Notes (Signed)
Pt arrived by pov to ER for low grade temp and diarrhea. Pt reports having a liver transplant s/p 1 year ago.  Pt reports 5 weeks ago she had surgery for complications that happened during the transplant surgery.  Pt has had a low grade fever all day today that finally broke upon arrival to ED after taking tylenol at home. Pt reports current symptoms of frequent diarrhea, headache, nausea, abdominal pain, and has not eaten in 36 hours, threw up this morning.

## 2021-01-08 ENCOUNTER — Encounter (HOSPITAL_COMMUNITY): Payer: Self-pay

## 2021-01-08 ENCOUNTER — Emergency Department (HOSPITAL_COMMUNITY): Payer: Managed Care, Other (non HMO)

## 2021-01-08 LAB — RESP PANEL BY RT-PCR (FLU A&B, COVID) ARPGX2
Influenza A by PCR: NEGATIVE
Influenza B by PCR: NEGATIVE
SARS Coronavirus 2 by RT PCR: NEGATIVE

## 2021-01-08 MED ORDER — IOHEXOL 350 MG/ML SOLN
80.0000 mL | Freq: Once | INTRAVENOUS | Status: AC | PRN
  Administered 2021-01-08: 80 mL via INTRAVENOUS

## 2021-01-08 NOTE — Discharge Instructions (Addendum)

## 2021-01-08 NOTE — ED Notes (Signed)
Unsuccessful attempt drawing cultures.

## 2021-01-13 LAB — CULTURE, BLOOD (ROUTINE X 2)
Culture: NO GROWTH
Special Requests: ADEQUATE

## 2021-04-10 ENCOUNTER — Other Ambulatory Visit (HOSPITAL_BASED_OUTPATIENT_CLINIC_OR_DEPARTMENT_OTHER): Payer: Self-pay | Admitting: Gastroenterology

## 2021-04-10 DIAGNOSIS — Z944 Liver transplant status: Secondary | ICD-10-CM

## 2021-04-11 ENCOUNTER — Ambulatory Visit (HOSPITAL_BASED_OUTPATIENT_CLINIC_OR_DEPARTMENT_OTHER)
Admission: RE | Admit: 2021-04-11 | Discharge: 2021-04-11 | Disposition: A | Discharge status: Home / Self Care | Payer: Managed Care, Other (non HMO) | Source: Ambulatory Visit | Attending: Gastroenterology | Admitting: Gastroenterology

## 2021-04-11 ENCOUNTER — Other Ambulatory Visit: Payer: Self-pay

## 2021-04-11 ENCOUNTER — Encounter (HOSPITAL_BASED_OUTPATIENT_CLINIC_OR_DEPARTMENT_OTHER): Payer: Self-pay

## 2021-04-11 DIAGNOSIS — Z944 Liver transplant status: Secondary | ICD-10-CM

## 2021-05-17 ENCOUNTER — Emergency Department (HOSPITAL_COMMUNITY)
Admission: EM | Admit: 2021-05-17 | Discharge: 2021-05-17 | Disposition: A | Discharge status: Home / Self Care | Payer: Managed Care, Other (non HMO) | Attending: Emergency Medicine | Admitting: Emergency Medicine

## 2021-05-17 ENCOUNTER — Encounter (HOSPITAL_COMMUNITY): Payer: Self-pay

## 2021-05-17 ENCOUNTER — Other Ambulatory Visit: Payer: Self-pay

## 2021-05-17 ENCOUNTER — Emergency Department (HOSPITAL_COMMUNITY): Payer: Managed Care, Other (non HMO)

## 2021-05-17 DIAGNOSIS — L03114 Cellulitis of left upper limb: Secondary | ICD-10-CM | POA: Insufficient documentation

## 2021-05-17 DIAGNOSIS — Z7982 Long term (current) use of aspirin: Secondary | ICD-10-CM | POA: Diagnosis not present

## 2021-05-17 DIAGNOSIS — E039 Hypothyroidism, unspecified: Secondary | ICD-10-CM | POA: Diagnosis not present

## 2021-05-17 DIAGNOSIS — M79632 Pain in left forearm: Secondary | ICD-10-CM | POA: Diagnosis present

## 2021-05-17 MED ORDER — DOXYCYCLINE HYCLATE 100 MG PO CAPS: 100.0000 mg | ORAL_CAPSULE | Freq: Two times a day (BID) | ORAL | 0 refills | Status: AC

## 2021-05-17 NOTE — Discharge Instructions (Addendum)
Your x-ray was negative today.  You will be prescribed doxycycline, take as prescribed.  You may follow-up with your primary care provider as needed.  You may follow-up with the transplant team as needed.  Return to the ED if you are experiencing increasing or worsening redness, pain, worsening symptoms.

## 2021-05-17 NOTE — ED Triage Notes (Signed)
Pt reports a "hard spot" on left forearm where she removed her dexcom. Hx of liver transplant and currently being treated for c.diff. Wants to r/o infection/ foreign body.

## 2021-05-17 NOTE — ED Provider Notes (Signed)
[facility] DEPT Provider Note   CSN: [id number] Arrival date & time: 05/17/21  1903     History Chief Complaint  Patient presents with   Abscess    Patricia Lutz is a 35 y.o. female with a PMHx of cirrhosis, liver transplant, who presents to the ED complaining of abscess to left forearm onset today. Pt reports this is the site of her dexcom that she removed. Has associated redness, left forearm pain.  Did not try any medication for symptoms. Denies fever, chills, nausea, vomiting. Pt takes immunosuppressants.   The history is provided by the patient. No language interpreter was used.      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     Family History  Problem Relation Age of Onset   Crohn's disease Mother    Crohn's disease Maternal Grandmother    Diabetes Brother     Social History   Tobacco Use   Smoking status: Never   Smokeless tobacco: Never  Vaping Use   Vaping Use: Never used  Substance Use Topics   Alcohol use: Not Currently    Alcohol/week: 0.0 standard drinks   Drug use: No    Home Medications Prior to Admission medications   Medication Sig Start Date End Date Taking? Authorizing Provider  doxycycline  (VIBRAMYCIN) 100 MG capsule Take 1 capsule (100 mg total) by mouth 2 (two) times daily for 5 days. 05/17/21 05/22/21 Yes Ziva Nunziata A, PA-C  alprazolam (XANAX) 2 MG tablet Take 2 mg by mouth 3 (three) times daily. 12/14/20   [provider]  ASPIRIN LOW DOSE 81 MG EC tablet Take 81 mg by mouth daily. 12/31/20   [provider]  clonazePAM (KLONOPIN) 2 MG tablet Take 1 tablet (2 mg total) by mouth at bedtime. Patient taking differently: Take 2 mg by mouth at bedtime as needed for anxiety. 08/10/20 01/07/21  Pucilowski, Marchia Bond, MD  cycloSPORINE modified (NEORAL) 25 MG capsule Take 75 mg by mouth daily. 12/16/20   [provider]  famotidine (PEPCID) 40 MG tablet Take 40 mg by mouth at bedtime.    [provider]  hydrocortisone (CORTEF) 10 MG tablet Take 10 mg by mouth 2 (two) times daily. 12/04/20   [provider]  insulin lispro (HUMALOG) 100 UNIT/ML KwikPen Inject 1 Units into the skin 3 (three) times daily as needed (high blood sugar). Take 1 unit if BS>150 01/01/21   [provider]  Lansoprazole (PREVACID PO) Take 40 mg by mouth in the morning and at bedtime.    [provider]  levothyroxine (SYNTHROID, LEVOTHROID) 125 MCG tablet Take 125 mcg by mouth daily before breakfast.  [provider]  magnesium oxide (MAG-OX) 400 MG tablet Take 3 tablets by mouth 2 (two) times daily. 11/01/20   [provider]  mycophenolate (MYFORTIC) 180 MG EC tablet Take 360 mg by mouth 2 (two) times daily. 10/27/20   [provider]  ondansetron (ZOFRAN-ODT) 4 MG disintegrating tablet Take 4 mg by mouth every 8 (eight) hours as needed for nausea or vomiting.  04/30/19   [provider]  pantoprazole (PROTONIX) 40 MG tablet Take 1 tablet (40 mg total) by mouth 2 (two) times daily. Patient not taking: Reported on 01/07/2021 05/24/16   Jola Schmidt, MD  VEMLIDY 25 MG TABS Take 1 tablet by mouth daily. 11/30/20   [provider]  zolpidem (AMBIEN) 10 MG tablet Take 10 mg by mouth at bedtime. 01/01/21   [provider]    Allergies    Ibuprofen and Saline  Review of Systems   Review of Systems  Constitutional:  Negative for chills and fever.  Gastrointestinal:  Negative for nausea and vomiting.  Skin:  Positive for color change. Negative for wound.       +hard spot to left forearm  All other systems reviewed and are negative.  Physical Exam Updated Vital Signs BP (!) 141/73    Pulse 75    Temp 98.3 F (36.8 C) (Oral)    Resp 18    Ht 5\' 2"  (1.575 m)    Wt 85.7 kg    SpO2 99%    BMI 34.57 kg/m   Physical Exam Vitals and nursing note reviewed.  Constitutional:      General: She is not in acute distress.    Appearance: Normal appearance.  Eyes:     General: No scleral icterus.    Extraocular Movements: Extraocular movements intact.  Cardiovascular:     Rate and Rhythm: Normal rate.  Pulmonary:     Effort: Pulmonary effort is normal. No respiratory distress.  Abdominal:     Palpations: Abdomen is soft. There is no mass.     Tenderness: There is no abdominal tenderness.  Musculoskeletal:        General: Normal range of motion.     Cervical back: Neck supple.  Skin:    General: Skin is warm and dry.     Findings: Erythema present. No abscess or rash.     Comments: Area of induration noted to left forearm.  No fluctuance.  Mild erythema noted to site.  Mild tenderness to palpation in the left forearm.  Full active range of motion of bilateral upper extremities.  Radial pulses intact bilaterally.  See picture below.  Neurological:     Mental Status: She is alert.     Sensory: Sensation is intact.     Motor: Motor function is intact.  Psychiatric:        Behavior: Behavior normal.     ED Results / Procedures / Treatments   Labs (all labs ordered are listed, but only abnormal results are displayed) Labs Reviewed - No data to display  EKG None  Radiology DG Forearm  Left  Result Date: 05/17/2021 CLINICAL DATA:  Recently removed a Dexcom from the left forearm with hard induration at the site of removal. Evaluate for foreign body. EXAM: LEFT FOREARM - 2 VIEW COMPARISON:  None. FINDINGS: There is no evidence of fracture or other focal bone lesions. Soft tissues are unremarkable. IMPRESSION: Negative. Electronically Signed   By: Telford Nab M.D.   On: 05/17/2021 19:55  Procedures Procedures   Medications Ordered in ED Medications - No data to display  ED Course  I have reviewed the triage vital signs and the nursing notes.  Pertinent labs & imaging results that were available during my care of the patient were reviewed by me and considered in my medical decision making (see chart for details).  Clinical Course as of 05/17/21 2215  Wed May 17, 2021  2138 Discussed case with attending who recommends cbc and cmp to rule out infection. [SB]  2138 Attending evaluated patient who agrees with discharge treatment plan of doxycycline.  Patient appears safe for discharge at this time.  Patient agreeable to discharge treatment plan. [SB]    Clinical Course User Index [SB] Kacey Vicuna A, PA-C   MDM Rules/Calculators/A&P                         Pt presents to the ED with hard spot noted to left forearm after removal of her dexcom.  She typically alternates between bilateral forearms with use of her Dexcom.  Patient does not have a history of diabetes however reports that she has issues with her pancreas which is why she uses her Dexcom.  Patient is currently being treated for C. difficile with antibiotics.  Vital signs stable, patient afebrile, not tachycardic, no hypoxia. Differential diagnosis includes, abscess, cellulitis. On exam, patient with induration noted to the left forearm.  No fluctuance noted.  Mild erythema noted to site.  Mild tenderness to palpation to left arm.  Full active range of motion bilateral upper extremities.. No acute cardiovascular or  respiratory exam findings.  Case discussed with attending who evaluated patient and agrees with antibiotic treatment.  Doxycycline prescription sent.  Supportive care measures and strict return precautions discussed with patient at bedside. Pt acknowledges and verbalizes understanding. Pt appears safe for discharge. Follow up as indicated in discharge paperwork.    Final Clinical Impression(s) / ED Diagnoses Final diagnoses:  Cellulitis of left upper extremity    Rx / DC Orders ED Discharge Orders          Ordered    doxycycline (VIBRAMYCIN) 100 MG capsule  2 times daily        05/17/21 2148             Ayomide Purdy A, PA-C 05/17/21 2215    Godfrey Pick, MD 05/18/21 [id number]

## 2021-05-17 NOTE — ED Notes (Signed)
An After Visit Summary was printed and given to the patient. Discharge instructions given and no further questions at this time.  

## 2021-08-22 ENCOUNTER — Emergency Department (HOSPITAL_COMMUNITY): Payer: BLUE CROSS/BLUE SHIELD

## 2021-08-22 ENCOUNTER — Encounter (HOSPITAL_COMMUNITY): Payer: Self-pay | Admitting: Emergency Medicine

## 2021-08-22 ENCOUNTER — Emergency Department (HOSPITAL_COMMUNITY)
Admission: EM | Admit: 2021-08-22 | Discharge: 2021-08-22 | Disposition: A | Discharge status: Home / Self Care | Payer: BLUE CROSS/BLUE SHIELD | Attending: Emergency Medicine | Admitting: Emergency Medicine

## 2021-08-22 DIAGNOSIS — Z7982 Long term (current) use of aspirin: Secondary | ICD-10-CM | POA: Diagnosis not present

## 2021-08-22 DIAGNOSIS — E039 Hypothyroidism, unspecified: Secondary | ICD-10-CM | POA: Diagnosis not present

## 2021-08-22 DIAGNOSIS — R110 Nausea: Secondary | ICD-10-CM | POA: Insufficient documentation

## 2021-08-22 DIAGNOSIS — R1010 Upper abdominal pain, unspecified: Secondary | ICD-10-CM | POA: Diagnosis present

## 2021-08-22 DIAGNOSIS — Z794 Long term (current) use of insulin: Secondary | ICD-10-CM | POA: Insufficient documentation

## 2021-08-22 DIAGNOSIS — R1084 Generalized abdominal pain: Secondary | ICD-10-CM | POA: Insufficient documentation

## 2021-08-22 LAB — COMPREHENSIVE METABOLIC PANEL
ALT: 14 U/L (ref 0–44)
AST: 16 U/L (ref 15–41)
Albumin: 4.4 g/dL (ref 3.5–5.0)
Alkaline Phosphatase: 50 U/L (ref 38–126)
Anion gap: 9 (ref 5–15)
BUN: 15 mg/dL (ref 6–20)
CO2: 27 mmol/L (ref 22–32)
Calcium: 9.4 mg/dL (ref 8.9–10.3)
Chloride: 103 mmol/L (ref 98–111)
Creatinine, Ser: 1.43 mg/dL — ABNORMAL HIGH (ref 0.44–1.00)
GFR, Estimated: 49 mL/min — ABNORMAL LOW (ref >60)
Glucose, Bld: 88 mg/dL (ref 70–99)
Potassium: 3.3 mmol/L — ABNORMAL LOW (ref 3.5–5.1)
Sodium: 139 mmol/L (ref 135–145)
Total Bilirubin: 1.7 mg/dL — ABNORMAL HIGH (ref 0.3–1.2)
Total Protein: 7.4 g/dL (ref 6.5–8.1)

## 2021-08-22 LAB — URINALYSIS, ROUTINE W REFLEX MICROSCOPIC
Bilirubin Urine: NEGATIVE
Glucose, UA: NEGATIVE mg/dL
Ketones, ur: NEGATIVE mg/dL
Leukocytes,Ua: NEGATIVE
Nitrite: NEGATIVE
Protein, ur: NEGATIVE mg/dL
Specific Gravity, Urine: 1.006 (ref 1.005–1.030)
pH: 7 (ref 5.0–8.0)

## 2021-08-22 LAB — I-STAT CHEM 8, ED
BUN: 13 mg/dL (ref 6–20)
Calcium, Ion: 1.15 mmol/L (ref 1.15–1.40)
Chloride: 102 mmol/L (ref 98–111)
Creatinine, Ser: 1.4 mg/dL — ABNORMAL HIGH (ref 0.44–1.00)
Glucose, Bld: 81 mg/dL (ref 70–99)
HCT: 41 % (ref 36.0–46.0)
Hemoglobin: 13.9 g/dL (ref 12.0–15.0)
Potassium: 3.6 mmol/L (ref 3.5–5.1)
Sodium: 140 mmol/L (ref 135–145)
TCO2: 28 mmol/L (ref 22–32)

## 2021-08-22 LAB — CBC
HCT: 40.5 % (ref 36.0–46.0)
Hemoglobin: 13.1 g/dL (ref 12.0–15.0)
MCH: 29.8 pg (ref 26.0–34.0)
MCHC: 32.3 g/dL (ref 30.0–36.0)
MCV: 92 fL (ref 80.0–100.0)
Platelets: 165 10*3/uL (ref 150–400)
RBC: 4.4 MIL/uL (ref 3.87–5.11)
RDW: 14.8 % (ref 11.5–15.5)
WBC: 10.2 10*3/uL (ref 4.0–10.5)
nRBC: 0 % (ref 0.0–0.2)

## 2021-08-22 LAB — I-STAT BETA HCG BLOOD, ED (MC, WL, AP ONLY): I-stat hCG, quantitative: <5 m[IU]/mL (ref <5)

## 2021-08-22 LAB — LIPASE, BLOOD: Lipase: 35 U/L (ref 11–51)

## 2021-08-22 MED ORDER — DICYCLOMINE HCL 20 MG PO TABS: 20.0000 mg | ORAL_TABLET | Freq: Two times a day (BID) | ORAL | 0 refills | Status: DC

## 2021-08-22 MED ORDER — SODIUM CHLORIDE (PF) 0.9 % IJ SOLN
INTRAMUSCULAR | Status: AC
  Filled 2021-08-22: qty 50

## 2021-08-22 MED ORDER — LACTATED RINGERS IV BOLUS: 1000.0000 mL | Freq: Once | INTRAVENOUS | Status: DC

## 2021-08-22 MED ORDER — HYDROMORPHONE HCL 1 MG/ML IJ SOLN
1.0000 mg | Freq: Once | INTRAMUSCULAR | Status: AC
  Administered 2021-08-22: 1 mg via INTRAVENOUS
  Filled 2021-08-22: qty 1

## 2021-08-22 MED ORDER — SODIUM CHLORIDE 0.9 % IV BOLUS
1000.0000 mL | Freq: Once | INTRAVENOUS | Status: AC
  Administered 2021-08-22: 1000 mL via INTRAVENOUS

## 2021-08-22 MED ORDER — IOHEXOL 300 MG/ML  SOLN
100.0000 mL | Freq: Once | INTRAMUSCULAR | Status: AC | PRN
  Administered 2021-08-22: 100 mL via INTRAVENOUS

## 2021-08-22 MED ORDER — ONDANSETRON HCL 4 MG/2ML IJ SOLN
4.0000 mg | Freq: Once | INTRAMUSCULAR | Status: AC
  Administered 2021-08-22: 4 mg via INTRAVENOUS
  Filled 2021-08-22: qty 2

## 2021-08-22 NOTE — ED Provider Notes (Signed)
?[facility] DEPT ?Provider Note ? ? ?CSN: [id number] ?Arrival date & time: 08/22/21  0809 ? ?  ? ?History ? ?Chief Complaint  ?Patient presents with  ? Abdominal Pain  ? ? ?Patricia Lutz is a 36 y.o. female. ?36 year old female presents to ED with longstanding history of upper abdominal pain and nausea. Patient received a liver transplant 2 years ago and has had persistent pain since. She underwent a "reconstruction surgery" 2 months ago for the pain, but it has not subsided. Pain is described as sharp and stabbing and is "barely touched" by her oxycodone. Movement and eating exacerbate pain. Eating is associated with nausea and "noticeable abdominal bulge." She has been receiving pain management from ** but they have since stopped prescribing due to her surgical following. Her next surgical appointment is in one months time. She has been taking laxatives to invoke bowl movements, and her last was this morning. She denies vomiting, fever, chills, constipation, SOB, chest pain, palpitations, chance of pregnancy. Patient is currently taking immunosuppressants ? 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 ? ? ? ?Abdominal Pain ?Associated symptoms: nausea   ?Associated symptoms: no chills, no constipation, no diarrhea, no dysuria, no fever, no hematuria, no sore throat and no vomiting   ?Abdominal Pain ?Associated symptoms: nausea   ?Associated symptoms: no chills, no constipation, no diarrhea, no dysuria, no fever, no hematuria, no sore throat and no vomiting   ? ?  ? ?Home Medications ?Prior to Admission medications   ?Medication Sig Start Date End Date Taking? Authorizing Provider  ?dicyclomine (BENTYL) 20 MG tablet Take 1 tablet (20 mg total) by mouth 2 (two) times daily. 08/22/21  Yes Wilnette Kales, PA   ?alprazolam Duanne Moron) 2 MG tablet Take 2 mg by mouth 3 (three) times daily. 12/14/20   [provider]  ?ASPIRIN LOW DOSE 81 MG EC tablet Take 81 mg by mouth daily. 12/31/20   [provider]  ?clonazePAM (KLONOPIN) 2 MG tablet Take 1 tablet (2 mg total) by mouth at bedtime. ?Patient taking differently: Take 2 mg by mouth at bedtime as needed for anxiety. 08/10/20 01/07/21  Pucilowski, Marchia Bond, MD  ?cycloSPORINE modified (NEORAL) 25 MG capsule Take 75 mg by mouth daily. 12/16/20   [provider]  ?famotidine (PEPCID) 40 MG tablet Take 40 mg by mouth at bedtime.    [provider]  ?hydrocortisone (CORTEF) 10 MG tablet Take 10 mg by mouth 2 (two) times daily. 12/04/20   [provider]  ?insulin lispro (HUMALOG) 100 UNIT/ML KwikPen Inject 1 Units into the skin 3 (three) times daily as needed (high blood sugar). Take 1 unit if BS>150 01/01/21   [provider]  ?Lansoprazole (PREVACID PO) Take 40 mg by mouth in the morning and at bedtime.    [provider]  ?levothyroxine (SYNTHROID, LEVOTHROID) 125 MCG tablet Take 125 mcg by mouth daily before breakfast.     [provider]  ?magnesium oxide (MAG-OX) 400 MG tablet Take 3 tablets by mouth 2 (two) times daily. 11/01/20   [provider]  ?mycophenolate (MYFORTIC) 180 MG EC tablet Take 360 mg by mouth 2 (two) times daily. 10/27/20   [provider]  ?ondansetron (ZOFRAN-ODT) 4 MG disintegrating tablet Take 4 mg by mouth every 8 (eight) hours as needed for nausea or vomiting.  04/30/19   [provider]  ?pantoprazole (PROTONIX) 40 MG tablet Take 1 tablet (40 mg total) by mouth 2 (two) times daily. ?Patient not taking: Reported on 01/07/2021 05/24/16   Jola Schmidt, MD  ?VEMLIDY 25 MG TABS Take 1 tablet by mouth daily. 11/30/20   [provider]  ?zolpidem (AMBIEN) 10 MG tablet Take 10 mg by mouth at bedtime. 01/01/21   [provider]  ?   ? ?Allergies    ?Ibuprofen    ? ?Review of Systems   ?Review of Systems  ?Constitutional:  Positive for appetite change. Negative for chills, diaphoresis and fever.  ?HENT:  Negative for congestion, ear pain, sinus pain, sore throat and voice change.   ?Eyes: Negative.   ?Respiratory: Negative.    ?Cardiovascular: Negative.   ?Gastrointestinal:  Positive for abdominal pain and nausea. Negative for blood in stool, constipation, diarrhea, rectal pain and vomiting.  ?     Pain worsening with movement and eating  ?Endocrine: Negative.   ?Genitourinary:  Negative for decreased urine volume, difficulty urinating, dyspareunia, dysuria, genital sores, hematuria and menstrual problem.  ?Musculoskeletal:  Negative for back pain and myalgias.  ?Skin:  Negative for color change, pallor, rash and wound.  ?Neurological:  Negative for dizziness, seizures, light-headedness and headaches.  ?Hematological: Negative.   ?Psychiatric/Behavioral: Negative.    ? ?Physical Exam ?Updated Vital Signs ?BP (!) 121/98   Pulse 70   Temp 98.7 ?F (37.1 ?C) (Oral)   Resp 18   LMP 08/23/2018   SpO2 100%  ?Physical Exam ?Constitutional:   ?   General: She is not in acute distress. ?   Appearance: She is well-developed. She is obese. She is not ill-appearing or diaphoretic.  ?Cardiovascular:  ?   Rate and Rhythm: Normal rate and regular rhythm.  ?   Heart sounds: Normal heart sounds.  ?Pulmonary:  ?   Effort: Pulmonary effort is normal.  ?   Breath sounds: Normal breath sounds.  ?Abdominal:  ?   General: A surgical scar is present. Bowel sounds are normal.  ?   Palpations: Abdomen is soft.  ?   Tenderness: There is abdominal tenderness in the right upper quadrant. There is guarding.  ?   Hernia: No hernia is present.  ?Skin: ?   General: Skin is warm and dry.  ?Neurological:  ?   General: No focal deficit present.  ?   Mental Status: She is alert and oriented to person, place, and time.  ?Psychiatric:     ?   Mood and Affect: Mood normal.     ?   Behavior: Behavior normal.   ? ? ?ED Results / Procedures / Treatments   ?Labs ?(all labs ordered are listed, but only abnormal results are displayed) ?Labs Reviewed  ?COMPREHENSIVE METABOLIC PANEL - Abnormal; Notable for the following components:  ?    Result Value  ? Potassium 3.3 (*)   ? Creatinine, Ser 1.43 (*)   ? Total Bilirubin 1.7 (*)   ? GFR, Estimated 49 (*)   ? All other components within normal limits  ?URINALYSIS, ROUTINE W REFLEX MICROSCOPIC - Abnormal; Notable for the following components:  ? Hgb urine dipstick LARGE (*)   ? Bacteria, UA RARE (*)   ? All other components within normal limits  ?I-STAT CHEM 8, ED - Abnormal; Notable for the following components:  ? Creatinine, Ser 1.40 (*)   ? All other components within normal limits  ?LIPASE, BLOOD  ?CBC  ?I-STAT BETA HCG BLOOD, ED ([place],  [facility], [facility] ONLY)  ? ? ?EKG ?None ? ?Radiology ?CT Abdomen Pelvis W Contrast ? ?Result Date: 08/22/2021 ?CLINICAL DATA:  Abdominal pain EXAM: CT ABDOMEN AND PELVIS WITH CONTRAST TECHNIQUE: Multidetector CT imaging of the abdomen and pelvis was performed using the standard protocol following bolus administration of intravenous contrast. RADIATION DOSE REDUCTION: This exam was performed according to the departmental dose-optimization program which includes automated exposure control, adjustment of the mA and/or kV according to patient size and/or use of iterative reconstruction technique. CONTRAST:  160mL OMNIPAQUE IOHEXOL 300 MG/ML  SOLN COMPARISON:  04/11/2021 FINDINGS: Lower chest: Unremarkable. Hepatobiliary: There is fatty infiltration in the liver. There is 3 mm low-density in the right lobe in image 22 of series 2. This may suggest a small cyst or hemangioma. Gallbladder is not seen. There is slight prominence of extrahepatic bile ducts. Pancreas: No focal abnormality is seen. Spleen: Spleen measures 13.2 cm in maximum diameter. Adrenals/Urinary Tract: Adrenals are unremarkable. There is no hydronephrosis. There are no renal or ureteral stones.  Urinary bladder is unremarkable. Stomach/Bowel: Small hiatal hernia is seen. Small bowel loops are not dilated. Appendix is not dilated. Fluid is seen in the lumen of right colon. There is no significant wall thickenin

## 2021-08-22 NOTE — Discharge Instructions (Addendum)
Patient was instructed to follow up with family doctor and surgeon regarding exploring further pain management options. Patient was educated to return to ED if pain worsens or does not reside.  ?

## 2021-08-22 NOTE — ED Triage Notes (Signed)
Per pt, states she has been having stabbing right mid quadrant pain-states she has not been able to sleep-states N/V-states she took a laxative because she thought she was constipated due to pain meds ?

## 2021-11-09 ENCOUNTER — Other Ambulatory Visit (HOSPITAL_COMMUNITY): Payer: Self-pay | Admitting: Student

## 2021-11-09 ENCOUNTER — Other Ambulatory Visit: Payer: Self-pay | Admitting: Student

## 2021-11-09 DIAGNOSIS — N83201 Unspecified ovarian cyst, right side: Secondary | ICD-10-CM

## 2021-11-14 ENCOUNTER — Ambulatory Visit (HOSPITAL_BASED_OUTPATIENT_CLINIC_OR_DEPARTMENT_OTHER)
Admission: RE | Admit: 2021-11-14 | Discharge: 2021-11-14 | Disposition: A | Discharge status: Home / Self Care | Payer: BLUE CROSS/BLUE SHIELD | Source: Ambulatory Visit | Attending: Student | Admitting: Student

## 2021-11-14 DIAGNOSIS — N83202 Unspecified ovarian cyst, left side: Secondary | ICD-10-CM | POA: Insufficient documentation

## 2021-11-14 DIAGNOSIS — N83201 Unspecified ovarian cyst, right side: Secondary | ICD-10-CM | POA: Diagnosis present

## 2022-01-13 IMAGING — CT CT ABD-PELV W/ CM
2 of 4 series · 15 of 46 positions shown, 17 images · IV contrast (OMNIPAQUE 300)
Comparison: 06/21/2019

CLINICAL DATA: Nausea, vomiting

EXAM:
CT ABDOMEN AND PELVIS WITH CONTRAST
TECHNIQUE: Multidetector CT imaging of the abdomen and pelvis was performed
using the standard protocol following bolus administration of
intravenous contrast.
CONTRAST:  100mL OMNIPAQUE IOHEXOL 300 MG/ML  SOLN

[Series 2: axial st · axial · 0.75mm/px · z∈[-504,-98]mm · 12 of 93 slices shown, 14 images]
[im 6/93  soft-tissue]
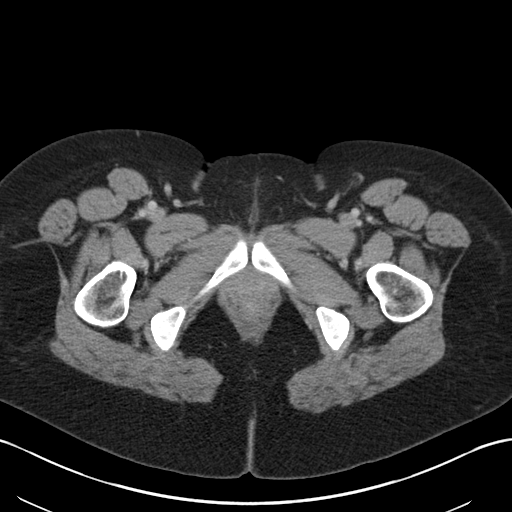
[im 6/93  bone]
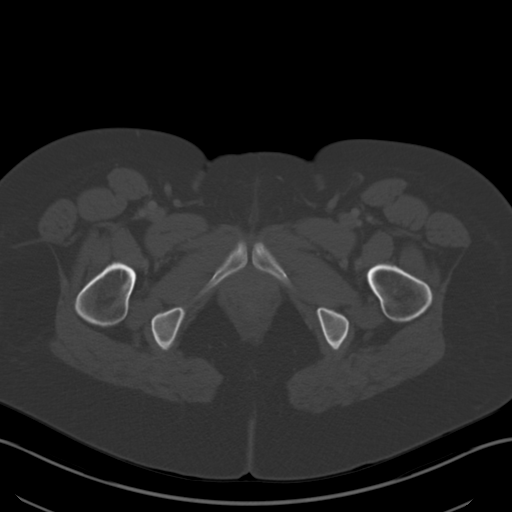
[im 17/93  soft-tissue]
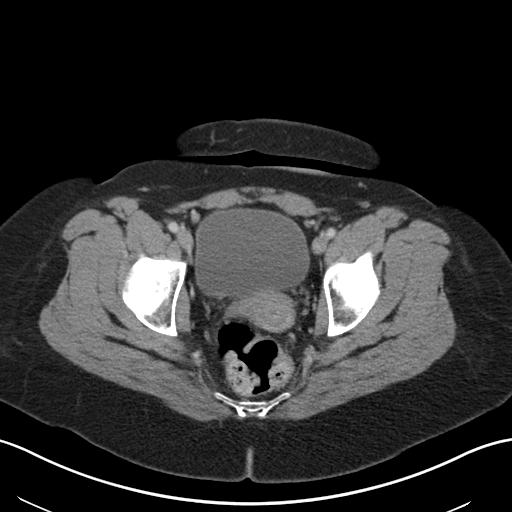
[im 22/93  soft-tissue]
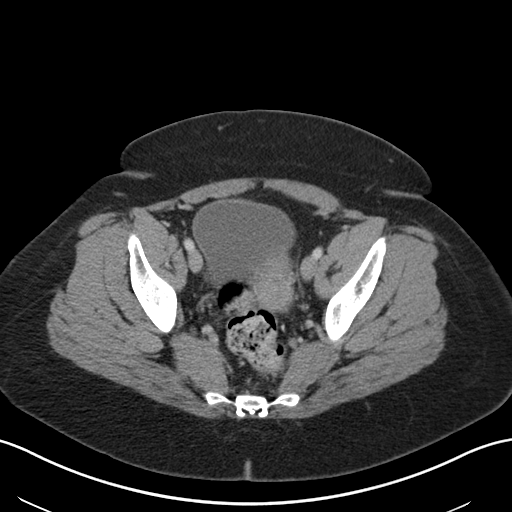
[im 28/93  soft-tissue]
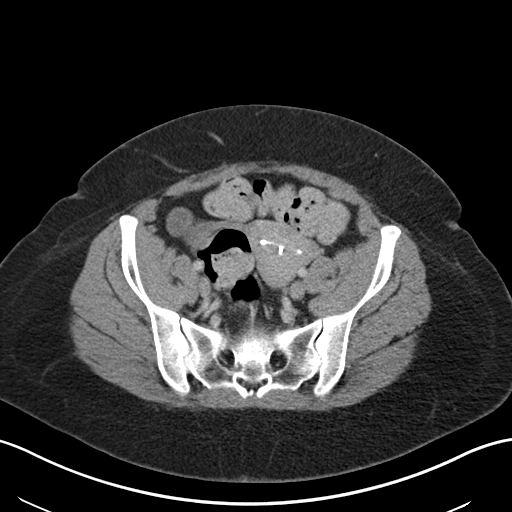
[im 38/93  soft-tissue]
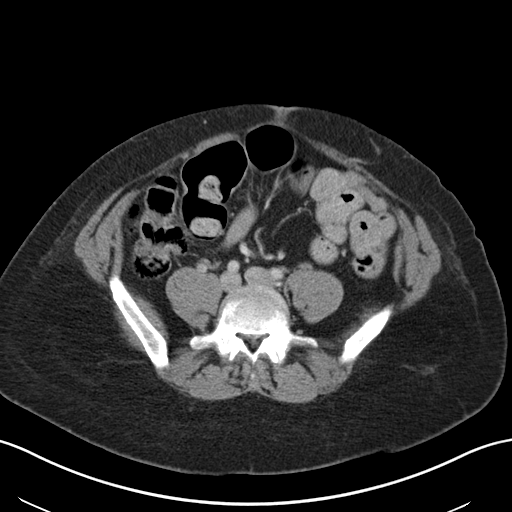
[im 44/93  soft-tissue]
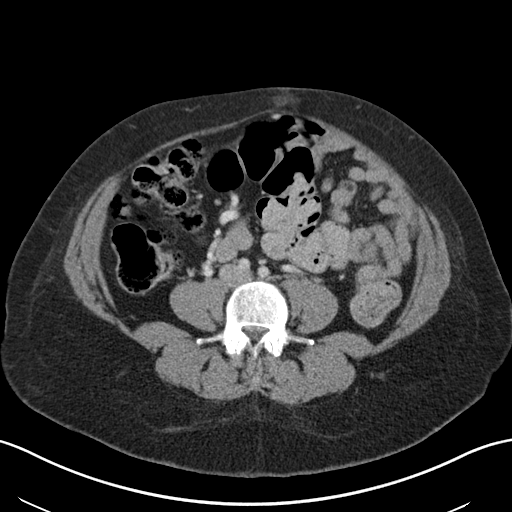
[im 49/93  soft-tissue]
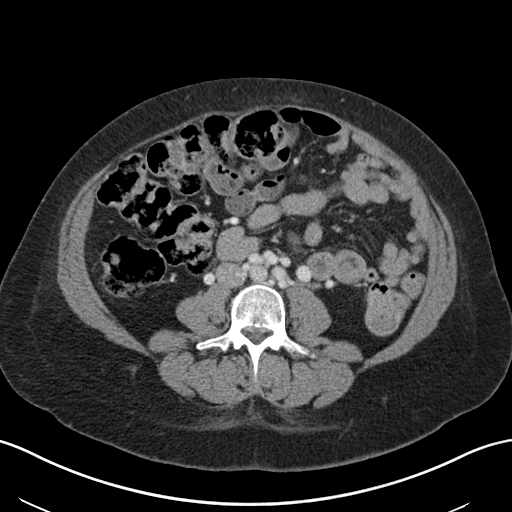
[im 60/93  soft-tissue]
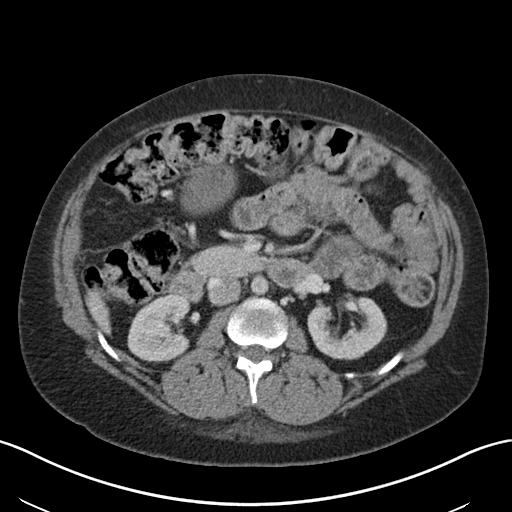
[im 65/93  soft-tissue]
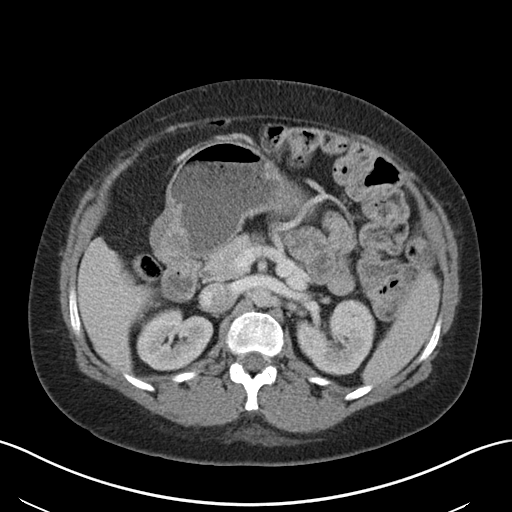
[im 65/93  bone]
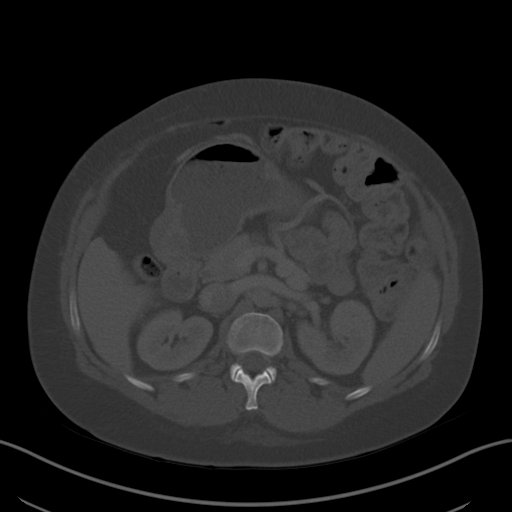
[im 71/93  soft-tissue]
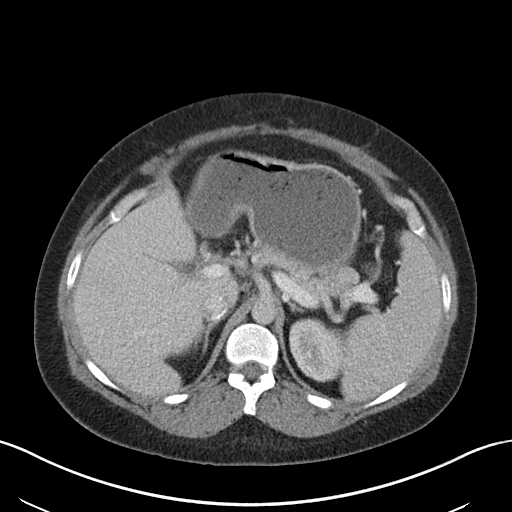
[im 82/93  soft-tissue]
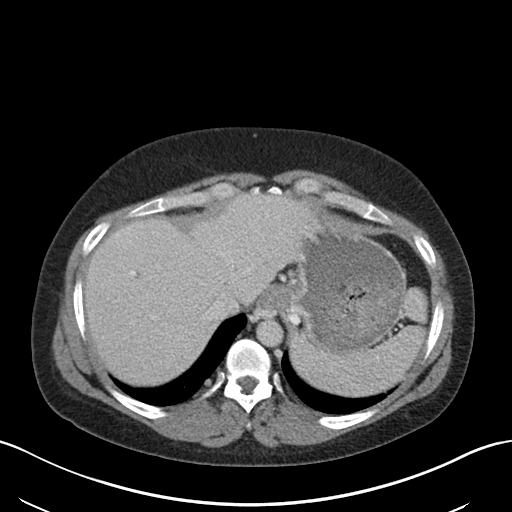
[im 87/93  soft-tissue]
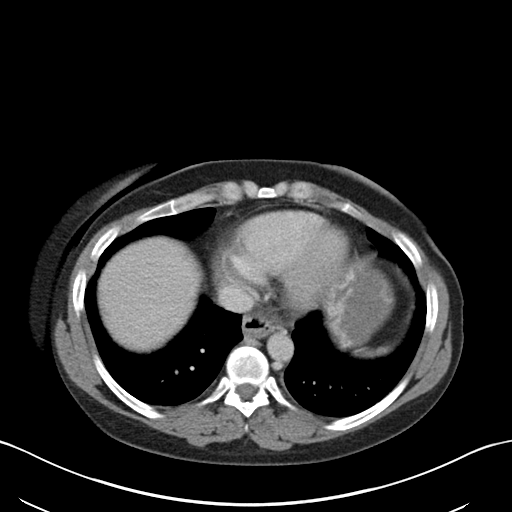

[Series 5: coronal st · coronal · 0.70mm/px · 3 of 157 slices shown]
[im 53/157  soft-tissue]
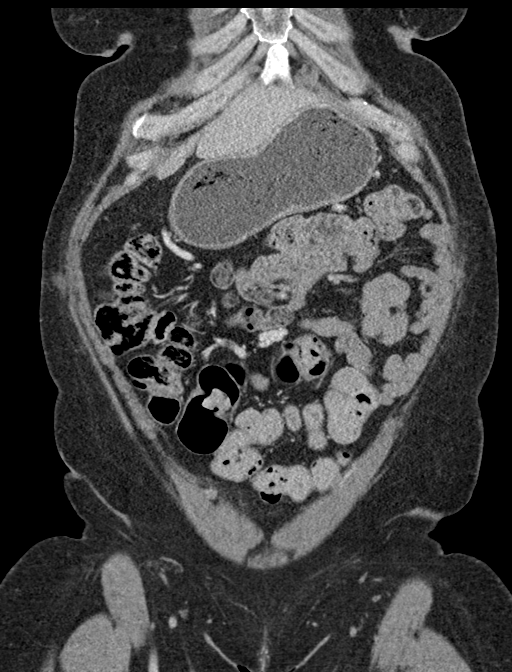
[im 70/157  soft-tissue]
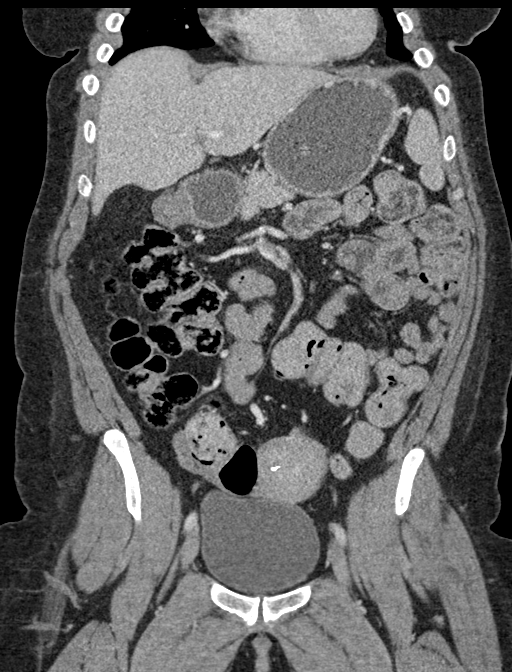
[im 87/157  soft-tissue]
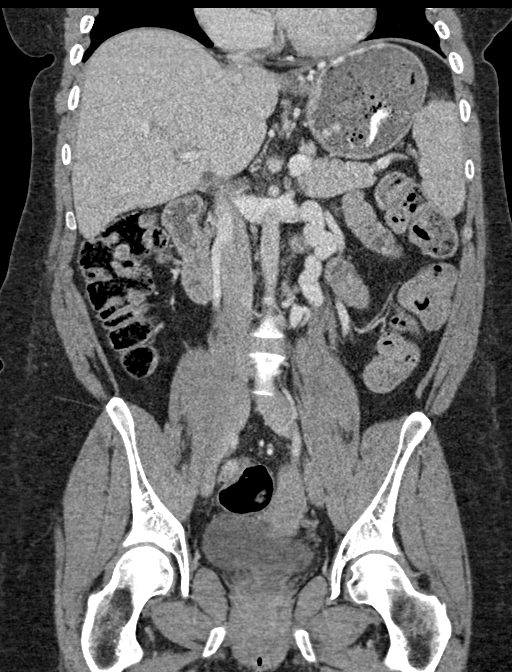

[15 of 46 positions shown; findings below may reference images not displayed]

FINDINGS: Lower chest: The visualized lung bases are clear bilaterally. The
visualized heart and pericardium are unremarkable. Surgical staple
line noted at the hepatocaval junction in keeping with given history
of liver transplantation. Multiple gastroesophageal varices are
noted.

Hepatobiliary: Gallbladder absent. No focal intrahepatic mass
identified. Normal enhancement of the hepatic parenchyma. No intra
or extrahepatic biliary ductal dilation. The portal vein is
relatively small in caliber, but is patent. The hepatic artery is
not well assessed on this non arteriographic study. A 14 mm water
attenuation cystic structures identified within the porta hepatis
adjacent to, but separate from the a extrahepatic bile duct,
possibly representing a postoperative seroma.

Pancreas: Unremarkable

Spleen: The spleen is mildly enlarged measuring 13.5 cm in greatest
dimension, improved since prior examination. No intrasplenic lesion
identified. The splenic vein is patent.

Adrenals/Urinary Tract: The adrenal glands are atrophic. The kidneys
are unremarkable. The bladder is unremarkable.

Stomach/Bowel: Stomach is within normal limits. Appendix appears
normal. No evidence of bowel wall thickening, distention, or
inflammatory changes. No free intraperitoneal gas or fluid.

Vascular/Lymphatic: Multiple portosystemic collaterals are
identified including an indirect splenorenal shunt as well as the
retroperitoneum within the left periaortic region. These appear
relatively decompressed when compared to prior examination. The
abdominal vasculature is otherwise unremarkable. No pathologic
adenopathy within the abdomen and pelvis.

Reproductive: Intrauterine device in expected position. The pelvic
organs are otherwise unremarkable.

Other: No abdominal wall hernia.  Rectum unremarkable.

Musculoskeletal: No acute bone abnormality. No lytic or blastic bone
lesion identified.
IMPRESSION: Surgical changes in keeping with orthotopic liver transplantation.
No evidence of surgical complication. Patency of the portal vein and
normal enhancement of the hepatic parenchyma. The hepatic artery is
not well assessed on this non arteriographic study.

Sequela of portal venous hypertension with numerous portosystemic
collaterals and mild splenomegaly, both of which appear improved
since prior, preoperative examination.

No acute intra-abdominal pathology identified. No definite
radiographic explanation for the patient's reported symptoms.

## 2022-02-12 ENCOUNTER — Encounter (HOSPITAL_COMMUNITY): Payer: Self-pay

## 2022-02-12 ENCOUNTER — Emergency Department (HOSPITAL_COMMUNITY): Payer: BLUE CROSS/BLUE SHIELD

## 2022-02-12 ENCOUNTER — Emergency Department (HOSPITAL_COMMUNITY)
Admission: EM | Admit: 2022-02-12 | Discharge: 2022-02-12 | Discharge status: Against Medical Advice | Payer: BLUE CROSS/BLUE SHIELD

## 2022-02-12 ENCOUNTER — Other Ambulatory Visit: Payer: Self-pay

## 2022-02-12 ENCOUNTER — Emergency Department (HOSPITAL_COMMUNITY)
Admission: EM | Admit: 2022-02-12 | Discharge: 2022-02-12 | Disposition: A | Discharge status: Home / Self Care | Payer: BLUE CROSS/BLUE SHIELD | Attending: Emergency Medicine | Admitting: Emergency Medicine

## 2022-02-12 DIAGNOSIS — Z7982 Long term (current) use of aspirin: Secondary | ICD-10-CM | POA: Insufficient documentation

## 2022-02-12 DIAGNOSIS — Z79899 Other long term (current) drug therapy: Secondary | ICD-10-CM | POA: Diagnosis not present

## 2022-02-12 DIAGNOSIS — Z794 Long term (current) use of insulin: Secondary | ICD-10-CM | POA: Diagnosis not present

## 2022-02-12 DIAGNOSIS — E119 Type 2 diabetes mellitus without complications: Secondary | ICD-10-CM | POA: Insufficient documentation

## 2022-02-12 DIAGNOSIS — E039 Hypothyroidism, unspecified: Secondary | ICD-10-CM | POA: Insufficient documentation

## 2022-02-12 DIAGNOSIS — R1084 Generalized abdominal pain: Secondary | ICD-10-CM | POA: Diagnosis present

## 2022-02-12 DIAGNOSIS — R339 Retention of urine, unspecified: Secondary | ICD-10-CM

## 2022-02-12 HISTORY — DX: Type 2 diabetes mellitus without complications: E11.9

## 2022-02-12 LAB — COMPREHENSIVE METABOLIC PANEL
ALT: 27 U/L (ref 0–44)
AST: 26 U/L (ref 15–41)
Albumin: 4.6 g/dL (ref 3.5–5.0)
Alkaline Phosphatase: 48 U/L (ref 38–126)
Anion gap: 11 (ref 5–15)
BUN: 19 mg/dL (ref 6–20)
CO2: 24 mmol/L (ref 22–32)
Calcium: 9.5 mg/dL (ref 8.9–10.3)
Chloride: 105 mmol/L (ref 98–111)
Creatinine, Ser: 1.17 mg/dL — ABNORMAL HIGH (ref 0.44–1.00)
GFR, Estimated: >60 mL/min (ref >60)
Glucose, Bld: 90 mg/dL (ref 70–99)
Potassium: 3.8 mmol/L (ref 3.5–5.1)
Sodium: 140 mmol/L (ref 135–145)
Total Bilirubin: 1 mg/dL (ref 0.3–1.2)
Total Protein: 7.6 g/dL (ref 6.5–8.1)

## 2022-02-12 LAB — URINALYSIS, ROUTINE W REFLEX MICROSCOPIC
Bilirubin Urine: NEGATIVE
Glucose, UA: NEGATIVE mg/dL
Hgb urine dipstick: NEGATIVE
Ketones, ur: NEGATIVE mg/dL
Leukocytes,Ua: NEGATIVE
Nitrite: NEGATIVE
Protein, ur: NEGATIVE mg/dL
Specific Gravity, Urine: 1.003 — ABNORMAL LOW (ref 1.005–1.030)
pH: 7 (ref 5.0–8.0)

## 2022-02-12 LAB — CBC
HCT: 42.4 % (ref 36.0–46.0)
Hemoglobin: 13.1 g/dL (ref 12.0–15.0)
MCH: 27 pg (ref 26.0–34.0)
MCHC: 30.9 g/dL (ref 30.0–36.0)
MCV: 87.4 fL (ref 80.0–100.0)
Platelets: 275 10*3/uL (ref 150–400)
RBC: 4.85 MIL/uL (ref 3.87–5.11)
RDW: 15 % (ref 11.5–15.5)
WBC: 9.3 10*3/uL (ref 4.0–10.5)
nRBC: 0 % (ref 0.0–0.2)

## 2022-02-12 LAB — I-STAT BETA HCG BLOOD, ED (MC, WL, AP ONLY): I-stat hCG, quantitative: <5 m[IU]/mL (ref <5)

## 2022-02-12 LAB — AMMONIA: Ammonia: 43 umol/L — ABNORMAL HIGH (ref 9–35)

## 2022-02-12 LAB — MAGNESIUM: Magnesium: 2 mg/dL (ref 1.7–2.4)

## 2022-02-12 LAB — LIPASE, BLOOD: Lipase: 48 U/L (ref 11–51)

## 2022-02-12 MED ORDER — FENTANYL CITRATE PF 50 MCG/ML IJ SOSY
100.0000 ug | PREFILLED_SYRINGE | Freq: Once | INTRAMUSCULAR | Status: AC
  Administered 2022-02-12: 100 ug via INTRAVENOUS
  Filled 2022-02-12: qty 2

## 2022-02-12 MED ORDER — IOHEXOL 300 MG/ML  SOLN
100.0000 mL | Freq: Once | INTRAMUSCULAR | Status: AC | PRN
  Administered 2022-02-12: 100 mL via INTRAVENOUS

## 2022-02-12 NOTE — ED Notes (Signed)
Patient transported to CT 

## 2022-02-12 NOTE — ED Notes (Signed)
Pt called x3 for triage, no response.

## 2022-02-12 NOTE — ED Notes (Signed)
Post void residual 467ml per Dr. Doren Custard. Will insert foley cath as ordered.

## 2022-02-12 NOTE — ED Notes (Signed)
Removed standard drainage bag and applied leg bag per Dr. Doren Custard request. Education on leg bag and its use provided.

## 2022-02-12 NOTE — ED Provider Triage Note (Signed)
Emergency Medicine Provider Triage Evaluation Note  Patricia Lutz , a 36 y.o. female  was evaluated in triage.  Pt complains of upper abdominal pain. Reports that she was told to come to [facility] to do a "full day of testing" however couldn't because of childcare. She is a transplant patient with [facility]. She reports abodminal bloating for the past few months but worsening over the past few days. She reports it feel liks her abdomen is "going to explode". No nasuea, vomiting, or fevers. She reports that there was something wrong with her soidum and kindeys. Unable to see labs. She additionally reports that she had her I&Os calculated and has been retaining water.   Review of Systems  Positive:  Negative:   Physical Exam  BP 122/86 (BP Location: Left Arm)   Pulse 95   Temp 98.9 F (37.2 C) (Oral)   Resp 18   Ht 5\' 2"  (1.575 m)   Wt 84 kg   SpO2 99%   BMI 33.86 kg/m  Gen:   Awake, no distress   Resp:  Normal effort  MSK:   Moves extremities without difficulty  Other:  Abdomen distended, but soft. Large surgical scar present  Medical Decision Making  Medically screening exam initiated at 10:56 AM.  Appropriate orders placed.  Patricia Lutz was informed that the remainder of the evaluation will be completed by another provider, this initial triage assessment does not replace that evaluation, and the importance of remaining in the ED until their evaluation is complete.  Discussed with attending. Will order abdominal pain protocol.   Sherrell Puller, PA-C 02/12/22 1104

## 2022-02-12 NOTE — Discharge Instructions (Addendum)
There is a telephone number below to set up a follow-up appointment with urology.  During your follow-up, they will likely remove Foley and see if you had any further urinary retention.  In the meantime, discontinue use of Phenergan.  Avoid use of Bentyl.  These are medications that can cause urine retention.  Continue to follow-up with your other outpatient providers for ongoing management of your chronic conditions.  Return to the emergency department at any time for any new or worsening symptoms of concern.

## 2022-02-12 NOTE — ED Triage Notes (Signed)
Patient c/o increased abdominal pain. Patient states she has intermittent abdominal distention. Recommended by a health care provider that she has been seeing recommended that she go to [facility] for admission. Patient states that she is not emptying her bladder and has a low sodium level.

## 2022-02-12 NOTE — ED Notes (Signed)
Dr. Doren Custard aware of foley output. Will leave foley unclamped per request of provider.

## 2022-02-12 NOTE — ED Provider Notes (Signed)
[facility] DEPT Provider Note   CSN: [id number] Arrival date & time: 02/12/22  9024     History  Chief Complaint  Patient presents with   Abdominal Pain    Patricia Lutz is a 36 y.o. female.   Abdominal Pain Patient presents for abdominal pain.  Medical history includes remote alcohol use disorder, PUD, GAD, cirrhosis, depression, DM, GERD, Hashimoto's disease, hypothyroidism.  She underwent liver transplant 2 years ago.  Postoperatively, she had an anastomotic stricture and post ERCP pancreatitis.  She underwent ex lap 1 year ago for lysis of adhesions.  She is currently being monitored for development of liver nodules.  She undergoes regular imaging.  Today, she reports abdominal bloating over the past several months that has been worsening over the past few days.  She has not had any nausea or vomiting.  Area of pain and distention has been her upper abdomen.  She is previously on oxycodone for management of chronic pain.  She has been started on buprenorphine transdermal patch.  Her pain has been severe and is worsened with any movements and postprandially as well.  She has continued to have normal bowel movements, the last of which was yesterday.  She has been monitoring her intake and output of fluids and is concerned of fluid and/or urine retention.       Home Medications Prior to Admission medications   Medication Sig Start Date End Date Taking? Authorizing Provider  alprazolam Duanne Moron) 2 MG tablet Take 2 mg by mouth 3 (three) times daily. 12/14/20  Yes [provider]  ASPIRIN LOW DOSE 81 MG EC tablet Take 81 mg by mouth daily. 12/31/20  Yes [provider]  buprenorphine (BUTRANS) 20 MCG/HR PTWK Place 1 patch onto the skin once a week. 01/25/22  Yes [provider]  Calcium Citrate-Vitamin D3 315-6.25 MG-MCG TABS Take 2 tablets by mouth in the morning and at bedtime. 09/28/19  Yes [provider]  cycloSPORINE modified  (NEORAL) 25 MG capsule Take 50 mg by mouth in the morning and at bedtime. 12/16/20  Yes [provider]  famotidine (PEPCID) 40 MG tablet Take 40 mg by mouth at bedtime.   Yes [provider]  hydrocortisone (CORTEF) 5 MG tablet Take 10-15 mg by mouth See admin instructions. Take 15 mg by mouth in the morning and then take 10 mg by mouth in the evening per patient 11/17/21  Yes [provider]  insulin lispro (HUMALOG) 100 UNIT/ML KwikPen Inject 5 Units into the skin 3 (three) times daily as needed (high blood sugar). Take 1 unit if BS>150 01/01/21  Yes [provider]  lansoprazole (PREVACID) 15 MG capsule Take 30 mg by mouth 2 (two) times daily before a meal.   Yes [provider]  levothyroxine (SYNTHROID) 137 MCG tablet Take 137 mcg by mouth daily before breakfast.   Yes [provider]  magnesium oxide (MAG-OX) 400 MG tablet Take 2 tablets by mouth 2 (two) times daily. 11/01/20  Yes [provider]  mycophenolate (MYFORTIC) 180 MG EC tablet Take 180 mg by mouth 2 (two) times daily. 10/27/20  Yes [provider]  norethindrone (AYGESTIN) 5 MG tablet Take 1 tablet by mouth daily. 11/03/21  Yes [provider]  ondansetron (ZOFRAN-ODT) 4 MG disintegrating tablet Take 4 mg by mouth every 8 (eight) hours as needed for nausea or vomiting.  04/30/19  Yes [provider]  promethazine (PHENERGAN) 25 MG tablet Take 25 mg by mouth every  8 (eight) hours as needed for nausea or vomiting. 01/25/22  Yes [provider]  VEMLIDY 25 MG TABS Take 1 tablet by mouth daily. 11/30/20  Yes [provider]  zolpidem (AMBIEN) 10 MG tablet Take 10 mg by mouth at bedtime. 01/01/21  Yes [provider]  clonazePAM (KLONOPIN) 2 MG tablet Take 1 tablet (2 mg total) by mouth at bedtime. Patient taking differently: Take 2 mg by mouth at bedtime as needed for anxiety. 08/10/20 01/07/21  Pucilowski, Marchia Bond, MD  dicyclomine  (BENTYL) 20 MG tablet Take 1 tablet (20 mg total) by mouth 2 (two) times daily. Patient not taking: Reported on 02/12/2022 08/22/21   Dion Saucier A, PA  pantoprazole (PROTONIX) 40 MG tablet Take 1 tablet (40 mg total) by mouth 2 (two) times daily. Patient not taking: Reported on 01/07/2021 05/24/16   Jola Schmidt, MD      Allergies    Ibuprofen    Review of Systems   Review of Systems  Gastrointestinal:  Positive for abdominal distention and abdominal pain.  Genitourinary:  Positive for decreased urine volume.  All other systems reviewed and are negative.   Physical Exam Updated Vital Signs BP 113/86   Pulse 85   Temp 98.8 F (37.1 C) (Oral)   Resp 18   Ht 5\' 2"  (1.575 m)   Wt 84 kg   SpO2 99%   BMI 33.86 kg/m  Physical Exam Vitals and nursing note reviewed.  Constitutional:      General: She is not in acute distress.    Appearance: She is well-developed. She is not ill-appearing, toxic-appearing or diaphoretic.  HENT:     Head: Normocephalic and atraumatic.     Mouth/Throat:     Mouth: Mucous membranes are moist.     Pharynx: Oropharynx is clear.  Eyes:     General: No scleral icterus.    Conjunctiva/sclera: Conjunctivae normal.     Pupils: Pupils are equal, round, and reactive to light.  Cardiovascular:     Rate and Rhythm: Normal rate and regular rhythm.     Heart sounds: No murmur heard. Pulmonary:     Effort: Pulmonary effort is normal. No respiratory distress.  Abdominal:     General: There is distension.     Palpations: Abdomen is soft. There is no shifting dullness or fluid wave.     Tenderness: There is generalized abdominal tenderness. There is no guarding or rebound.  Musculoskeletal:        General: No swelling.     Cervical back: Neck supple.  Skin:    General: Skin is warm and dry.     Capillary Refill: Capillary refill takes less than 2 seconds.     Coloration: Skin is not cyanotic or jaundiced.  Neurological:     General: No focal deficit  present.     Mental Status: She is alert and oriented to person, place, and time.  Psychiatric:        Mood and Affect: Mood normal.        Behavior: Behavior normal.     ED Results / Procedures / Treatments   Labs (all labs ordered are listed, but only abnormal results are displayed) Labs Reviewed  COMPREHENSIVE METABOLIC PANEL - Abnormal; Notable for the following components:      Result Value   Creatinine, Ser 1.17 (*)    All other components within normal limits  URINALYSIS, ROUTINE W REFLEX MICROSCOPIC - Abnormal; Notable for the following components:   Color,  Urine STRAW (*)    Specific Gravity, Urine 1.003 (*)    All other components within normal limits  AMMONIA - Abnormal; Notable for the following components:   Ammonia 43 (*)    All other components within normal limits  LIPASE, BLOOD  CBC  MAGNESIUM  I-STAT BETA HCG BLOOD, ED ([facility], [facility], [facility] ONLY)    EKG None  Radiology CT ABDOMEN PELVIS W CONTRAST  Result Date: 02/12/2022 CLINICAL DATA:  Abdominal pain liver transplant EXAM: CT ABDOMEN AND PELVIS WITH CONTRAST TECHNIQUE: Multidetector CT imaging of the abdomen and pelvis was performed using the standard protocol following bolus administration of intravenous contrast. RADIATION DOSE REDUCTION: This exam was performed according to the departmental dose-optimization program which includes automated exposure control, adjustment of the mA and/or kV according to patient size and/or use of iterative reconstruction technique. CONTRAST:  132mL OMNIPAQUE IOHEXOL 300 MG/ML  SOLN COMPARISON:  CT 08/22/2021 FINDINGS: Lower chest: Lung bases are clear. Hepatobiliary: Minimal intrahepatic biliary duct dilatation. No complications associated with liver transplant. Portal veins are patent. Arterial vessels not well evaluated on portal venous phase imaging. Pancreas: Pancreas is normal. No ductal dilatation. No pancreatic inflammation. Spleen: Normal spleen Adrenals/urinary tract: Adrenal  glands and kidneys are normal. The ureters and bladder normal. Foley catheter in bladder. Stomach/Bowel: Stomach, small bowel, appendix, and cecum are normal. The colon and rectosigmoid colon are normal. Vascular/Lymphatic: Abdominal aorta is normal caliber. No periportal or retroperitoneal adenopathy. No pelvic adenopathy. Reproductive: IUD expected location.  Ovaries normal. Other: No free fluid. Musculoskeletal: No aggressive osseous lesion. IMPRESSION: 1. No complication associated with liver transplant. Minimal intrahepatic ductal prominence. 2. No acute findings abdomen pelvis. 3. Foley catheter in bladder. Electronically Signed   By: Suzy Bouchard M.D.   On: 02/12/2022 13:29    Procedures Procedures    Medications Ordered in ED Medications  fentaNYL (SUBLIMAZE) injection 100 mcg (100 mcg Intravenous Given 02/12/22 1314)  iohexol (OMNIPAQUE) 300 MG/ML solution 100 mL (100 mLs Intravenous Contrast Given 02/12/22 1258)    ED Course/ Medical Decision Making/ A&P                           Medical Decision Making Risk Prescription drug management.   This patient presents to the ED for concern of abdominal pain and distention, this involves an extensive number of treatment options, and is a complaint that carries with it a high risk of complications and morbidity.  The differential diagnosis includes ascites, constipation, gastroparesis, anasarca, urine retention   Co morbidities that complicate the patient evaluation  alcohol use disorder, PUD, GAD, cirrhosis, depression, DM, GERD, Hashimoto's disease, hypothyroidism   Additional history obtained:  Additional history obtained from N/A External records from outside source obtained and reviewed including EMR   Lab Tests:  I Ordered, and personally interpreted labs.  The pertinent results include: Improved creatinine from prior lab work, normal electrolytes, normal hemoglobin, no leukocytosis, normal lipase, urinalysis without  evidence of infection.   Imaging Studies ordered:  I ordered imaging studies including CT of abdomen and pelvis I independently visualized and interpreted imaging which showed no acute findings I agree with the radiologist interpretation   Cardiac Monitoring: / EKG:  The patient was maintained on a cardiac monitor.  I personally viewed and interpreted the cardiac monitored which showed an underlying rhythm of: Sinus rhythm  Problem List / ED Course / Critical interventions / Medication management  Patient presents for chronic abdominal distention and  pain that has recently worsened.  Medical history is notable for liver transplant 2 years ago.  She is followed by the liver team at [facility] for this.  Per chart review, she was last seen in their office in May.  There were concerns about liver lesions which were to be imaged for surveillance every 6 months.  Patient describes a severe upper abdominal pain with feelings of distention.  This pain is worsened with eating and moving.  She is currently on a transdermal buprenorphine patch for management of her chronic pain.  She has not had any nausea or vomiting and does continue to have normal bowel movements.  She is concerned of urine retention.  On arrival in the ED, she is well-appearing with normal vital signs.  She does have a generalized tenderness to her abdomen.  There does seem to be some mild upper abdominal distention.  On bedside ultrasound, patient has 400 cc of postvoid residual urine.  Foley catheter was ordered.  Patient to undergo laboratory work-up and CT scan for further evaluation of her upper abdominal discomfort.  Patient's lab work is all reassuring.  Her creatinine has down trended from prior lab work.  All her electrolytes are normal.  She has no leukocytosis.  She has no elevation in hepatobiliary enzymes.  Following placement of Foley catheter, patient had approximate 1.5 L of urine output while in the ED.  With this urine output,  she reported significant resolution in her symptoms, including her sensation of upper abdominal bloating and pain.  She underwent CT scan which did not show any acute findings.  She was informed of reassuring work-up results.  On review of her home medications, she is prescribed both Phenergan and Bentyl, both of which can cause urinary retention.  She states that she does not take Bentyl and has only recently started taking Phenergan.  She was advised to discontinue use of Phenergan and to follow-up with urology for trial of voiding.  Foley catheter to remain in place at this time.  She was discharged in good condition. I ordered medication including fentanyl for analgesia Reevaluation of the patient after these medicines showed that the patient improved I have reviewed the patients home medicines and have made adjustments as needed   Social Determinants of Health:  Has access to outpatient care        Final Clinical Impression(s) / ED Diagnoses Final diagnoses:  Urinary retention    Rx / DC Orders ED Discharge Orders     None         Godfrey Pick, MD 02/12/22 1711

## 2022-04-25 ENCOUNTER — Other Ambulatory Visit: Payer: Self-pay

## 2022-04-25 ENCOUNTER — Encounter (HOSPITAL_COMMUNITY): Payer: Self-pay

## 2022-04-25 ENCOUNTER — Emergency Department (HOSPITAL_COMMUNITY)
Admission: EM | Admit: 2022-04-25 | Discharge: 2022-04-25 | Disposition: A | Discharge status: Home / Self Care | Payer: BLUE CROSS/BLUE SHIELD | Attending: Emergency Medicine | Admitting: Emergency Medicine

## 2022-04-25 DIAGNOSIS — R7309 Other abnormal glucose: Secondary | ICD-10-CM | POA: Diagnosis not present

## 2022-04-25 DIAGNOSIS — R300 Dysuria: Secondary | ICD-10-CM | POA: Diagnosis present

## 2022-04-25 DIAGNOSIS — Z7982 Long term (current) use of aspirin: Secondary | ICD-10-CM | POA: Diagnosis not present

## 2022-04-25 DIAGNOSIS — N3000 Acute cystitis without hematuria: Secondary | ICD-10-CM | POA: Diagnosis not present

## 2022-04-25 DIAGNOSIS — N184 Chronic kidney disease, stage 4 (severe): Secondary | ICD-10-CM | POA: Insufficient documentation

## 2022-04-25 LAB — CBC WITH DIFFERENTIAL/PLATELET
Abs Immature Granulocytes: 0.03 10*3/uL (ref 0.00–0.07)
Basophils Absolute: 0 10*3/uL (ref 0.0–0.1)
Basophils Relative: 0 %
Eosinophils Absolute: 0.1 10*3/uL (ref 0.0–0.5)
Eosinophils Relative: 1 %
HCT: 39.9 % (ref 36.0–46.0)
Hemoglobin: 12.7 g/dL (ref 12.0–15.0)
Immature Granulocytes: 0 %
Lymphocytes Relative: 14 %
Lymphs Abs: 1.3 10*3/uL (ref 0.7–4.0)
MCH: 25.8 pg — ABNORMAL LOW (ref 26.0–34.0)
MCHC: 31.8 g/dL (ref 30.0–36.0)
MCV: 81.1 fL (ref 80.0–100.0)
Monocytes Absolute: 0.5 10*3/uL (ref 0.1–1.0)
Monocytes Relative: 6 %
Neutro Abs: 6.9 10*3/uL (ref 1.7–7.7)
Neutrophils Relative %: 79 %
Platelets: 221 10*3/uL (ref 150–400)
RBC: 4.92 MIL/uL (ref 3.87–5.11)
RDW: 17.4 % — ABNORMAL HIGH (ref 11.5–15.5)
WBC: 8.9 10*3/uL (ref 4.0–10.5)
nRBC: 0 % (ref 0.0–0.2)

## 2022-04-25 LAB — COMPREHENSIVE METABOLIC PANEL
ALT: 31 U/L (ref 0–44)
AST: 32 U/L (ref 15–41)
Albumin: 4.3 g/dL (ref 3.5–5.0)
Alkaline Phosphatase: 64 U/L (ref 38–126)
Anion gap: 13 (ref 5–15)
BUN: 20 mg/dL (ref 6–20)
CO2: 26 mmol/L (ref 22–32)
Calcium: 9.3 mg/dL (ref 8.9–10.3)
Chloride: 97 mmol/L — ABNORMAL LOW (ref 98–111)
Creatinine, Ser: 1.2 mg/dL — ABNORMAL HIGH (ref 0.44–1.00)
GFR, Estimated: 60 mL/min — ABNORMAL LOW (ref >60)
Glucose, Bld: 103 mg/dL — ABNORMAL HIGH (ref 70–99)
Potassium: 3.7 mmol/L (ref 3.5–5.1)
Sodium: 136 mmol/L (ref 135–145)
Total Bilirubin: 1.1 mg/dL (ref 0.3–1.2)
Total Protein: 7.4 g/dL (ref 6.5–8.1)

## 2022-04-25 LAB — URINALYSIS, ROUTINE W REFLEX MICROSCOPIC
Bacteria, UA: NONE SEEN
Bilirubin Urine: NEGATIVE
Glucose, UA: NEGATIVE mg/dL
Hgb urine dipstick: NEGATIVE
Ketones, ur: NEGATIVE mg/dL
Nitrite: POSITIVE — AB
Protein, ur: NEGATIVE mg/dL
Specific Gravity, Urine: 1.003 — ABNORMAL LOW (ref 1.005–1.030)
pH: 7 (ref 5.0–8.0)

## 2022-04-25 LAB — LACTIC ACID, PLASMA: Lactic Acid, Venous: 1.2 mmol/L (ref 0.5–1.9)

## 2022-04-25 LAB — HCG, QUANTITATIVE, PREGNANCY: hCG, Beta Chain, Quant, S: 2 m[IU]/mL (ref <5)

## 2022-04-25 LAB — CBG MONITORING, ED: Glucose-Capillary: 107 mg/dL — ABNORMAL HIGH (ref 70–99)

## 2022-04-25 MED ORDER — SULFAMETHOXAZOLE-TRIMETHOPRIM 800-160 MG PO TABS: 1.0000 | ORAL_TABLET | Freq: Two times a day (BID) | ORAL | 0 refills | Status: AC

## 2022-04-25 NOTE — ED Notes (Signed)
[facility] called for consults.

## 2022-04-25 NOTE — ED Provider Notes (Signed)
[facility] EMERGENCY DEPARTMENT Provider Note   CSN: [id number] Arrival date & time: 04/25/22  1045     History {Add pertinent medical, surgical, social history, OB history to HPI:1} Chief Complaint  Patient presents with   Dysuria    Patricia Lutz is a 36 y.o. female.   Dysuria      Home Medications Prior to Admission medications   Medication Sig Start Date End Date Taking? Authorizing Provider  alprazolam Duanne Moron) 2 MG tablet Take 2 mg by mouth 3 (three) times daily. 12/14/20   [provider]  ASPIRIN LOW DOSE 81 MG EC tablet Take 81 mg by mouth daily. 12/31/20   [provider]  buprenorphine (BUTRANS) 20 MCG/HR PTWK Place 1 patch onto the skin once a week. 01/25/22   [provider]  Calcium Citrate-Vitamin D3 315-6.25 MG-MCG TABS Take 2 tablets by mouth in the morning and at bedtime. 09/28/19   [provider]  clonazePAM (KLONOPIN) 2 MG tablet Take 1 tablet (2 mg total) by mouth at bedtime. Patient taking differently: Take 2 mg by mouth at bedtime as needed for anxiety. 08/10/20 01/07/21  Pucilowski, Marchia Bond, MD  cycloSPORINE modified (NEORAL) 25 MG capsule Take 50 mg by mouth in the morning and at bedtime. 12/16/20   [provider]  dicyclomine (BENTYL) 20 MG tablet Take 1 tablet (20 mg total) by mouth 2 (two) times daily. Patient not taking: Reported on 02/12/2022 08/22/21   Dion Saucier A, PA  famotidine (PEPCID) 40 MG tablet Take 40 mg by mouth at bedtime.    [provider]  hydrocortisone (CORTEF) 5 MG tablet Take 10-15 mg by mouth See admin instructions. Take 15 mg by mouth in the morning and then take 10 mg by mouth in the evening per patient 11/17/21   [provider]  insulin lispro (HUMALOG) 100 UNIT/ML KwikPen Inject 5 Units into the skin 3 (three) times daily as needed (high blood sugar). Take 1 unit if BS>150 01/01/21   [provider]  lansoprazole (PREVACID) 15 MG capsule Take  30 mg by mouth 2 (two) times daily before a meal.    [provider]  levothyroxine (SYNTHROID) 137 MCG tablet Take 137 mcg by mouth daily before breakfast.    [provider]  magnesium oxide (MAG-OX) 400 MG tablet Take 2 tablets by mouth 2 (two) times daily. 11/01/20   [provider]  mycophenolate (MYFORTIC) 180 MG EC tablet Take 180 mg by mouth 2 (two) times daily. 10/27/20   [provider]  norethindrone (AYGESTIN) 5 MG tablet Take 1 tablet by mouth daily. 11/03/21   [provider]  ondansetron (ZOFRAN-ODT) 4 MG disintegrating tablet Take 4 mg by mouth every 8 (eight) hours as needed for nausea or vomiting.  04/30/19   [provider]  pantoprazole (PROTONIX) 40 MG tablet Take 1 tablet (40 mg total) by mouth 2 (two) times daily. Patient not taking: Reported on 01/07/2021 05/24/16   Jola Schmidt, MD  promethazine (PHENERGAN) 25 MG tablet Take 25 mg by mouth every 8 (eight) hours as needed for nausea or vomiting. 01/25/22   [provider]  VEMLIDY 25 MG TABS Take 1 tablet by mouth daily. 11/30/20   [provider]  zolpidem (AMBIEN) 10 MG tablet Take 10 mg by mouth at bedtime. 01/01/21   [provider]      Allergies    Ibuprofen    Review of Systems   Review of Systems  Genitourinary:  Positive for dysuria.    Physical Exam Updated Vital Signs BP 102/77 (BP Location: Left Arm)   Pulse 70   Temp 97.7 F (36.5 C) (Oral)   Resp 16   Ht 5\' 2"  (1.575 m)   Wt 83.9 kg   SpO2 100%   BMI 33.84 kg/m  Physical Exam  ED Results / Procedures / Treatments   Labs (all labs ordered are listed, but only abnormal results are displayed) Labs Reviewed  COMPREHENSIVE METABOLIC PANEL - Abnormal; Notable for the following components:      Result Value   Chloride 97 (*)    Glucose, Bld 103 (*)    Creatinine, Ser 1.20 (*)    GFR, Estimated 60 (*)    All other components within normal limits  CBC WITH  DIFFERENTIAL/PLATELET - Abnormal; Notable for the following components:   MCH 25.8 (*)    RDW 17.4 (*)    All other components within normal limits  URINALYSIS, ROUTINE W REFLEX MICROSCOPIC - Abnormal; Notable for the following components:   Specific Gravity, Urine 1.003 (*)    Nitrite POSITIVE (*)    Leukocytes,Ua TRACE (*)    All other components within normal limits  CBG MONITORING, ED - Abnormal; Notable for the following components:   Glucose-Capillary 107 (*)    All other components within normal limits  CULTURE, BLOOD (ROUTINE X 2)  CULTURE, BLOOD (ROUTINE X 2)  URINE CULTURE  LACTIC ACID, PLASMA  HCG, QUANTITATIVE, PREGNANCY  LACTIC ACID, PLASMA    EKG None  Radiology No results found.  Procedures Procedures  {Document cardiac monitor, telemetry assessment procedure when appropriate:1}  Medications Ordered in ED Medications - No data to display  ED Course/ Medical Decision Making/ A&P Clinical Course as of 04/25/22 1619  Wed Apr 25, 2022  1419 [place] - with liver transplant says it is ok from their standpoint  [RR]    Clinical Course User Index [RR] Sherrell Puller, PA-C                           Medical Decision Making Amount and/or Complexity of Data Reviewed Labs: ordered.   ***  {Document critical care time when appropriate:1} {Document review of labs and clinical decision tools ie heart score, Chads2Vasc2 etc:1}  {Document your independent review of radiology images, and any outside records:1} {Document your discussion with family members, caretakers, and with consultants:1} {Document social determinants of health affecting pt's care:1} {Document your decision making why or why not admission, treatments were needed:1} Final Clinical Impression(s) / ED Diagnoses Final diagnoses:  None    Rx / DC Orders ED Discharge Orders     None

## 2022-04-25 NOTE — Discharge Instructions (Addendum)
You were seen in the emergency room today for evaluation of the pain whenever you urinate.  You knew that you have a urinary tract infection.  Due to your complex medical state, we asked your transplant and your gynecologist about this medication.  It appears that the organism that is in your urine is only susceptible to Bactrim, however given your kidney disease, this complicates matters.  Your kidney disease is around your baseline today.  We will place him on a very short course of Bactrim, you will take it twice daily for the next 3 days.  I have included some information on Bactrim.  Please read the side effects and risk.  You will need to call your primary care doctor for a follow-up on the urine and your electrolytes right after you finish the antibiotics.  If you have any concerns, new or worsening symptoms, please return to the nearest emergency room for evaluation.  Contact a doctor if: You do not get better after 1-2 days. Your symptoms go away and then come back. Get help right away if: You have very bad back pain. You have very bad pain in your lower belly. You have a fever. You have chills. You feeling like you will vomit or you vomit.

## 2022-04-25 NOTE — ED Provider Triage Note (Signed)
Emergency Medicine Provider Triage Evaluation Note  Patricia Lutz , a 36 y.o. female  was evaluated in triage.  Pt complains of urinary urgency, dysuria, suprapubic pain, and fevers/chills Tmax 101.20F.Marland Kitchen Patient is extremely medically complex with liver transplant, CKD Stage IV, diabetes. She is now having an indwelling urinary catheter. Sees [facility] for this problem. Urine grew out stenotrophomonas maltophilia.  Review of Systems  Positive:  Negative:   Physical Exam  BP (!) 132/94   Pulse 72   Temp (!) 97.5 F (36.4 C) (Oral)   Resp 16   Ht 5\' 2"  (1.575 m)   Wt 83.9 kg   SpO2 100%   BMI 33.84 kg/m  Gen:   Awake, no distress   Resp:  Normal effort  MSK:   Moves extremities without difficulty  Other:    Medical Decision Making  Medically screening exam initiated at 12:07 PM.  Appropriate orders placed.  Patricia Lutz was informed that the remainder of the evaluation will be completed by another provider, this initial triage assessment does not replace that evaluation, and the importance of remaining in the ED until their evaluation is complete.  Vital signs are stable. Lab work ordered.    Sherrell Puller, PA-C 04/25/22 1214

## 2022-04-25 NOTE — ED Triage Notes (Signed)
Patient is being followed but [facility] due to continuing UTI and culture came back resistant to new drugs and only suspectible to bactrim which she can't take due to a liver transplant.  Patient was told to come here and have work up and possibly get transferred to [facility] . Reports dsyuria and frequency.

## 2022-04-30 ENCOUNTER — Telehealth (HOSPITAL_BASED_OUTPATIENT_CLINIC_OR_DEPARTMENT_OTHER): Payer: Self-pay

## 2022-04-30 LAB — URINE CULTURE: Culture: 60000 — AB

## 2022-04-30 LAB — CULTURE, BLOOD (ROUTINE X 2)
Culture: NO GROWTH
Special Requests: ADEQUATE

## 2022-04-30 NOTE — ED Notes (Signed)
Lab called with urine culture report 60 Stenotrophomonco,and 40 maltophilia report given to pharm. Roderic Palau who will follow up with Infectious Disease.

## 2022-04-30 NOTE — Telephone Encounter (Signed)
Post ED Visit - Positive Culture Follow-up: Successful Patient Follow-Up  Culture assessed and recommendations reviewed by:  [x]  [name], Pharm.D. []  Heide Guile, Pharm.D., BCPS AQ-ID []  Parks Neptune, Pharm.D., BCPS []  Alycia Rossetti, Pharm.D., BCPS []  [place], Pharm.D., BCPS, AAHIVP []  Legrand Como, Pharm.D., BCPS, AAHIVP []  Salome Arnt, PharmD, BCPS []  Johnnette Gourd, PharmD, BCPS []  Hughes Better, PharmD, BCPS []  Leeroy Cha, PharmD  Positive urine culture  []  Patient discharged without antimicrobial prescription and treatment is now indicated [x]  Organism is resistant to prescribed ED discharge antimicrobial []  Patient with positive blood cultures  Changes discussed with ED provider: Margarita Mail, PA-C Plan - call for sx check it positive for sx stop Bactrim and start Amoxil 500 mg po q 8 hours x 5 days.   Spoke with pt, she is currently being followed by Infectious Disease at [facility] and does not need any further assistance. Patient declined any new abx.   Contacted patient, date 04/30/2022, time 9:25 am   Glennon Hamilton 04/30/2022, 9:28 AM

## 2022-04-30 NOTE — Progress Notes (Signed)
ED Antimicrobial Stewardship Positive Culture Follow Up   Patricia Lutz is an 36 y.o. female who presented to [facility] on 04/25/2022 with a chief complaint of  Chief Complaint  Patient presents with   Dysuria    Recent Results (from the past 720 hour(s))  Blood culture (routine x 2)     Status: None (Preliminary result)   Collection Time: 04/25/22 12:40 PM   Specimen: BLOOD  Result Value Ref Range Status   Specimen Description BLOOD LEFT ANTECUBITAL  Final   Special Requests   Final    BOTTLES DRAWN AEROBIC AND ANAEROBIC Blood Culture adequate volume   Culture   Final    NO GROWTH 4 DAYS Performed at [facility], [address] [place], [place] [address]    Report Status PENDING  Incomplete  Urine Culture     Status: Abnormal   Collection Time: 04/25/22 12:44 PM   Specimen: Urine, Catheterized  Result Value Ref Range Status   Specimen Description URINE, CATHETERIZED  Final   Special Requests NONE  Final   Culture (A)  Final    60,000 COLONIES/mL ENTEROCOCCUS FAECALIS 40,000 COLONIES/mL STENOTROPHOMONAS MALTOPHILIA CRITICAL RESULT CALLED TO, READ BACK BY AND VERIFIED WITH: [facility] [phone] AT 810 AM BY CM Performed at [facility], [place] [address] [place], [place] [address]    Report Status 04/30/2022 FINAL  Final   Organism ID, Bacteria ENTEROCOCCUS FAECALIS (A)  Final   Organism ID, Bacteria STENOTROPHOMONAS MALTOPHILIA (A)  Final      Susceptibility   Enterococcus faecalis - MIC*    AMPICILLIN <=2 SENSITIVE Sensitive     NITROFURANTOIN <=16 SENSITIVE Sensitive     VANCOMYCIN 2 SENSITIVE Sensitive     * 60,000 COLONIES/mL ENTEROCOCCUS FAECALIS   Stenotrophomonas maltophilia - MIC*    LEVOFLOXACIN >=8 RESISTANT Resistant     TRIMETH/SULFA >=320 RESISTANT Resistant     * 40,000 COLONIES/mL STENOTROPHOMONAS MALTOPHILIA    Plan: Call for sx check, if remains positive for sx give Amoxil   ED Provider: Margarita Mail, PA-C   [name] 04/30/2022, 9:08 AM Clinical Pharmacist Monday - Friday phone -  [phone] Saturday - Sunday phone - [phone]

## 2022-07-15 ENCOUNTER — Other Ambulatory Visit: Payer: Self-pay

## 2022-07-15 ENCOUNTER — Encounter (HOSPITAL_COMMUNITY): Payer: Self-pay

## 2022-07-15 ENCOUNTER — Emergency Department (HOSPITAL_COMMUNITY)
Admission: EM | Admit: 2022-07-15 | Discharge: 2022-07-15 | Disposition: A | Discharge status: Home / Self Care | Payer: PRIVATE HEALTH INSURANCE | Attending: Emergency Medicine | Admitting: Emergency Medicine

## 2022-07-15 DIAGNOSIS — T839XXA Unspecified complication of genitourinary prosthetic device, implant and graft, initial encounter: Secondary | ICD-10-CM

## 2022-07-15 DIAGNOSIS — Z7982 Long term (current) use of aspirin: Secondary | ICD-10-CM | POA: Insufficient documentation

## 2022-07-15 DIAGNOSIS — Z794 Long term (current) use of insulin: Secondary | ICD-10-CM | POA: Insufficient documentation

## 2022-07-15 DIAGNOSIS — Z79899 Other long term (current) drug therapy: Secondary | ICD-10-CM | POA: Insufficient documentation

## 2022-07-15 DIAGNOSIS — R1030 Lower abdominal pain, unspecified: Secondary | ICD-10-CM | POA: Diagnosis present

## 2022-07-15 DIAGNOSIS — T83091A Other mechanical complication of indwelling urethral catheter, initial encounter: Secondary | ICD-10-CM | POA: Diagnosis not present

## 2022-07-15 NOTE — ED Provider Notes (Signed)
[place] EMERGENCY DEPARTMENT AT [facility] Provider Note   CSN: [id number] Arrival date & time: 07/15/22  1834     History  No chief complaint on file.   Patricia Lutz is a 37 y.o. female.  Patient with history of chronic indwelling foley catheter presents today with concerns that her foley is displaced. She states that she has her catheter replaced every 4 weeks and is due for replacement next week, however states that she has been moving recently and has had several instances where she accidentally pulled on her catheter.  She states that earlier this evening she noticed that her catheter was no longer draining and she was having pressure in her lower abdomen.  She states that she was able to readjust it and get it flowing again, however she states that it is very positional and if it is not in the appropriate position it stops flowing again and she is concerned to go to sleep tonight and wake up in pain. She is requesting to have the foley changed. She denies fevers or chills. Urine is clear appearing. She is currently on Keflex for UTI. States that her foley is normally changed without difficulty.  The history is provided by the patient. No language interpreter was used.       Home Medications Prior to Admission medications   Medication Sig Start Date End Date Taking? Authorizing Provider  alprazolam Duanne Moron) 2 MG tablet Take 2 mg by mouth 3 (three) times daily. 12/14/20   [provider]  ASPIRIN LOW DOSE 81 MG EC tablet Take 81 mg by mouth daily. 12/31/20   [provider]  buprenorphine (BUTRANS) 20 MCG/HR PTWK Place 1 patch onto the skin once a week. 01/25/22   [provider]  Calcium Citrate-Vitamin D3 315-6.25 MG-MCG TABS Take 2 tablets by mouth in the morning and at bedtime. 09/28/19   [provider]  clonazePAM (KLONOPIN) 2 MG tablet Take 1 tablet (2 mg total) by mouth at bedtime. Patient taking differently: Take 2 mg by mouth at  bedtime as needed for anxiety. 08/10/20 01/07/21  Pucilowski, Marchia Bond, MD  cycloSPORINE modified (NEORAL) 25 MG capsule Take 50 mg by mouth in the morning and at bedtime. 12/16/20   [provider]  dicyclomine (BENTYL) 20 MG tablet Take 1 tablet (20 mg total) by mouth 2 (two) times daily. Patient not taking: Reported on 02/12/2022 08/22/21   Dion Saucier A, PA  famotidine (PEPCID) 40 MG tablet Take 40 mg by mouth at bedtime.    [provider]  hydrocortisone (CORTEF) 5 MG tablet Take 10-15 mg by mouth See admin instructions. Take 15 mg by mouth in the morning and then take 10 mg by mouth in the evening per patient 11/17/21   [provider]  insulin lispro (HUMALOG) 100 UNIT/ML KwikPen Inject 5 Units into the skin 3 (three) times daily as needed (high blood sugar). Take 1 unit if BS>150 01/01/21   [provider]  lansoprazole (PREVACID) 15 MG capsule Take 30 mg by mouth 2 (two) times daily before a meal.    [provider]  levothyroxine (SYNTHROID) 137 MCG tablet Take 137 mcg by mouth daily before breakfast.    [provider]  magnesium oxide (MAG-OX) 400 MG tablet Take 2 tablets by mouth 2 (two) times daily. 11/01/20   [provider]  mycophenolate (MYFORTIC) 180 MG EC tablet Take 180 mg by mouth 2 (two) times daily. 10/27/20   [provider]  norethindrone (AYGESTIN) 5 MG tablet Take 1 tablet by mouth daily. 11/03/21   [provider]  ondansetron (ZOFRAN-ODT) 4 MG disintegrating tablet Take 4 mg by mouth every 8 (eight) hours as needed for nausea or vomiting.  04/30/19   [provider]  pantoprazole (PROTONIX) 40 MG tablet Take 1 tablet (40 mg total) by mouth 2 (two) times daily. Patient not taking: Reported on 01/07/2021 05/24/16   Jola Schmidt, MD  promethazine (PHENERGAN) 25 MG tablet Take 25 mg by mouth every 8 (eight) hours as needed for nausea or vomiting. 01/25/22   [provider]  VEMLIDY 25 MG  TABS Take 1 tablet by mouth daily. 11/30/20   [provider]  zolpidem (AMBIEN) 10 MG tablet Take 10 mg by mouth at bedtime. 01/01/21   [provider]      Allergies    Ibuprofen    Review of Systems   Review of Systems  All other systems reviewed and are negative.   Physical Exam Updated Vital Signs BP 126/71 (BP Location: Left Arm)   Pulse 76   Temp 98 F (36.7 C) (Oral)   Resp 18   SpO2 98%  Physical Exam Vitals and nursing note reviewed.  Constitutional:      General: She is not in acute distress.    Appearance: Normal appearance. She is normal weight. She is not ill-appearing, toxic-appearing or diaphoretic.  HENT:     Head: Normocephalic and atraumatic.  Cardiovascular:     Rate and Rhythm: Normal rate.  Pulmonary:     Effort: Pulmonary effort is normal. No respiratory distress.  Abdominal:     General: Abdomen is flat.     Palpations: Abdomen is soft.     Tenderness: There is no abdominal tenderness.  Musculoskeletal:        General: Normal range of motion.     Cervical back: Normal range of motion.  Skin:    General: Skin is warm and dry.  Neurological:     General: No focal deficit present.     Mental Status: She is alert.  Psychiatric:        Mood and Affect: Mood normal.        Behavior: Behavior normal.     ED Results / Procedures / Treatments   Labs (all labs ordered are listed, but only abnormal results are displayed) Labs Reviewed - No data to display  EKG None  Radiology No results found.  Procedures Procedures    Medications Ordered in ED Medications - No data to display  ED Course/ Medical Decision Making/ A&P                             Medical Decision Making  Patient presents today requesting to have her Foley catheter changed.  She is afebrile, nontoxic-appearing, and in no acute distress with reassuring vital signs.  Urine in the bag is yellow and noninfectious appearing.  Given the positional nature of  the Foley with concerns for drainage issues, I do think that changing it out is reasonable.  Patient is understanding and in agreement.  Foley has been changed without issue, patient has no other needs or complaints at this time.  She is already on antibiotics for UTI.  She is stable for discharge.  Educated on red flag prompting return.  Patient discharged stable condition.   Final Clinical Impression(s) / ED Diagnoses Final diagnoses:  Problem with Foley catheter, initial  encounter [facility])    Rx / DC Orders ED Discharge Orders     None     An After Visit Summary was printed and given to the patient.     Nestor Lewandowsky 07/15/22 2132    Leanord Asal K, DO 07/15/22 2227

## 2022-07-15 NOTE — ED Triage Notes (Signed)
Pt presents to ED from home C/O F/C dislodgement. Per pt, catheter stopped draining, so she adjusted her f/c by pushing tube further in and now she is concerned that it needs to be changed out. F/C draining clear yellow urine into standard drainage bag.

## 2022-07-15 NOTE — Discharge Instructions (Signed)
Your catheter was replaced today without issue.  I recommend close follow-up with your doctor for continued evaluation and management of your chronic health needs.  Return if development of any new or worsening symptoms.

## 2022-07-26 ENCOUNTER — Encounter (HOSPITAL_BASED_OUTPATIENT_CLINIC_OR_DEPARTMENT_OTHER): Payer: 59 | Attending: Internal Medicine | Admitting: Internal Medicine

## 2022-07-26 DIAGNOSIS — E232 Diabetes insipidus: Secondary | ICD-10-CM | POA: Diagnosis not present

## 2022-07-26 DIAGNOSIS — Y846 Urinary catheterization as the cause of abnormal reaction of the patient, or of later complication, without mention of misadventure at the time of the procedure: Secondary | ICD-10-CM | POA: Insufficient documentation

## 2022-07-26 DIAGNOSIS — T83511A Infection and inflammatory reaction due to indwelling urethral catheter, initial encounter: Secondary | ICD-10-CM | POA: Insufficient documentation

## 2022-07-26 DIAGNOSIS — S31109A Unspecified open wound of abdominal wall, unspecified quadrant without penetration into peritoneal cavity, initial encounter: Secondary | ICD-10-CM

## 2022-07-26 DIAGNOSIS — Z944 Liver transplant status: Secondary | ICD-10-CM | POA: Diagnosis not present

## 2022-07-26 DIAGNOSIS — L98491 Non-pressure chronic ulcer of skin of other sites limited to breakdown of skin: Secondary | ICD-10-CM | POA: Diagnosis not present

## 2022-07-28 NOTE — Progress Notes (Signed)
Duncan, Vicente Males ([id number]) [id number] Page 1 of 11 Visit Report for 07/26/2022 Chief Complaint Document Details Patient Name: Date of Service: Patricia Lutz 07/26/2022 12:45 PM Medical Record Number: [id number] Patient Account Number: [id number] Date of Birth/Sex: Treating RN: 1986-01-31 (37 y.o. F) Primary Care Provider: Oletta Darter, MA RIA Other Clinician: Referring Provider: Treating Provider/Extender: Shawna Clamp in Treatment: 0 Information Obtained from: Patient Chief Complaint 07/26/2022; scattered abdominal wounds limited to skin breakdown Electronic Signature(s) Signed: 07/26/2022 4:58:12 PM By: Kalman Shan DO Entered By: Kalman Shan on 07/26/2022 15:03:38 -------------------------------------------------------------------------------- Debridement Details Patient Name: Date of Service: Patricia Lutz NNA 07/26/2022 12:45 PM Medical Record Number: [id number] Patient Account Number: [id number] Date of Birth/Sex: Treating RN: 03/21/1986 (37 y.o. Donalda Ewings Primary Care Provider: Oletta Darter, MA RIA Other Clinician: Referring Provider: Treating Provider/Extender: Shawna Clamp in Treatment: 0 Debridement Performed for Assessment: Wound #1 Right Abdomen - Upper Quadrant Performed By: Clinician Sharyn Creamer, RN Debridement Type: Chemical/Enzymatic/Mechanical Agent Used: wound cleanser and gauze Level of Consciousness (Pre-procedure): Awake and Alert Pre-procedure Verification/Time Out No Taken: Bleeding: None Response to Treatment: Procedure was tolerated well Level of Consciousness (Post- Awake and Alert procedure): Post Debridement Measurements of Total Wound Length: (cm) 0.5 Width: (cm) 10 Depth: (cm) 0.1 Volume: (cm) 0.393 Character of Wound/Ulcer Post Debridement: Stable Post Procedure Diagnosis Same as Pre-procedure Electronic Signature(s) Signed: 07/26/2022 4:58:12 PM By: Kalman Shan DO Signed: 07/26/2022 5:08:37 PM By: Sharyn Creamer RN, BSN Entered By: Sharyn Creamer on 07/26/2022 16:57:08 -------------------------------------------------------------------------------- Debridement Details Patient Name: Date of Service: Patricia Lutz NNA 07/26/2022 12:45 PM Medical Record Number: [id number] Patient Account Number: [id number] Date of Birth/Sex: Treating RN: May 21, 1986 (37 y.o. Donalda Ewings Primary Care Provider: Oletta Darter, MA RIA Other Clinician: Referring Provider: Treating Provider/Extender: Shawna Clamp in Treatment: 0 Lutz, Patricia ([id number]) [id number] Page 2 of 11 Debridement Performed for Assessment: Wound #2 Right Abdomen - midline Performed By: Clinician Sharyn Creamer, RN Debridement Type: Chemical/Enzymatic/Mechanical Agent Used: wound cleanser and gauze Level of Consciousness (Pre-procedure): Awake and Alert Pre-procedure Verification/Time Out No Taken: Bleeding: None Response to Treatment: Procedure was tolerated well Level of Consciousness (Post- Awake and Alert procedure): Post Debridement Measurements of Total Wound Length: (cm) 3 Width: (cm) 8 Depth: (cm) 0.1 Volume: (cm) 1.885 Character of Wound/Ulcer Post Debridement: Stable Post Procedure Diagnosis Same as Pre-procedure Electronic Signature(s) Signed: 07/26/2022 4:58:12 PM By: Kalman Shan DO Signed: 07/26/2022 5:08:37 PM By: Sharyn Creamer RN, BSN Entered By: Sharyn Creamer on 07/26/2022 16:57:35 -------------------------------------------------------------------------------- Debridement Details Patient Name: Date of Service: Patricia Lutz NNA 07/26/2022 12:45 PM Medical Record Number: [id number] Patient Account Number: [id number] Date of Birth/Sex: Treating RN: 11/01/1985 (37 y.o. Donalda Ewings Primary Care Provider: Oletta Darter, MA RIA Other Clinician: Referring Provider: Treating Provider/Extender: Shawna Clamp in Treatment: 0 Debridement Performed for Assessment: Wound #3 Left Abdomen - Upper Quadrant Performed By: Clinician Sharyn Creamer, RN Debridement Type: Chemical/Enzymatic/Mechanical Agent Used: wound cleanser and gauze Level of Consciousness (Pre-procedure): Awake and Alert Pre-procedure Verification/Time Out No Taken: Bleeding: None Response to Treatment: Procedure was tolerated well Level of Consciousness (Post- Awake and Alert procedure): Post Debridement Measurements of Total Wound Length: (cm) 0.3 Width: (cm) 0.3 Depth: (cm) 0.1 Volume: (cm) 0.007 Character of Wound/Ulcer Post Debridement: Stable Post Procedure Diagnosis Same as Pre-procedure Electronic Signature(s) Signed: 07/26/2022 4:58:12 PM By: Kalman Shan DO Signed: 07/26/2022 5:08:37 PM By: Sharyn Creamer RN,  BSN Entered By: Sharyn Creamer on 07/26/2022 16:57:48 -------------------------------------------------------------------------------- Debridement Details Patient Name: Date of Service: Patricia Lutz NNA 07/26/2022 12:45 PM Medical Record Number: [id number] Patient Account Number: [id number] Date of Birth/Sex: Treating RN: [birth year] (37 y.o. Ardelle Balls, Lutz, Patricia ([id number]) [phone].pdf Page 3 of 11 Primary Care Provider: Oletta Darter, MA RIA Other Clinician: Referring Provider: Treating Provider/Extender: Shawna Clamp in Treatment: 0 Debridement Performed for Assessment: Wound #4 Left Abdomen - Lower Quadrant Performed By: Clinician Sharyn Creamer, RN Debridement Type: Chemical/Enzymatic/Mechanical Agent Used: wound cleanser and gauze Level of Consciousness (Pre-procedure): Awake and Alert Pre-procedure Verification/Time Out No Taken: Bleeding: None Response to Treatment: Procedure was tolerated well Level of Consciousness (Post- Awake and Alert procedure): Post Debridement Measurements of Total Wound Length:  (cm) 3.3 Width: (cm) 5 Depth: (cm) 0.1 Volume: (cm) 1.296 Character of Wound/Ulcer Post Debridement: Stable Post Procedure Diagnosis Same as Pre-procedure Electronic Signature(s) Signed: 07/26/2022 5:08:37 PM By: Sharyn Creamer RN, BSN Signed: 07/27/2022 11:02:34 AM By: Kalman Shan DO Previous Signature: 07/26/2022 4:58:12 PM Version By: Kalman Shan DO Entered By: Sharyn Creamer on 07/26/2022 16:58:02 -------------------------------------------------------------------------------- HPI Details Patient Name: Date of Service: Patricia Lutz NNA 07/26/2022 12:45 PM Medical Record Number: [id number] Patient Account Number: [id number] Date of Birth/Sex: Treating RN: June 26, 1985 (37 y.o. F) Primary Care Provider: Oletta Darter, MA RIA Other Clinician: Referring Provider: Treating Provider/Extender: Shawna Clamp in Treatment: 0 History of Present Illness HPI Description: 07/26/2022 Ms. 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 This occurs when her bladder becomes distended and her abdomen swells. She has been using Silvadene to the wound beds. She currently denies signs of infection. Electronic Signature(s) Signed: 07/26/2022 4:58:12 PM By: Kalman Shan DO Entered By: Kalman Shan on 07/26/2022 15:07:25 -------------------------------------------------------------------------------- Physical Exam Details Patient Name: Date of Service: Patricia Lutz NNA 07/26/2022 12:45 PM Medical Record Number: [id number] Patient Account Number: [id number] Date of Birth/Sex: Treating RN: 23-Jan-1986 (37 y.o. F) Primary Care Provider: Oletta Darter, MA RIA Other Clinician: Referring Provider: Treating Provider/Extender: Shawna Clamp in  Treatment: 0 Constitutional respirations regular, non-labored and within target range for patient.Marland Kitchen Psychiatric pleasant and cooperative. [place], Vicente Males ([id number]) [id number] Page 4 of 11 Notes Abdomen: Several scattered open wounds limited to skin breakdown. No signs of surrounding soft tissue infection. Abdomen soft and nondistended. Electronic Signature(s) Signed: 07/26/2022 4:58:12 PM By: Kalman Shan DO Entered By: Kalman Shan on 07/26/2022 15:08:35 -------------------------------------------------------------------------------- Physician Orders Details Patient Name: Date of Service: Patricia Lutz NNA 07/26/2022 12:45 PM Medical Record Number: [id number] Patient Account Number: [id number] Date of Birth/Sex: Treating RN: [birth year] (37 y.o. 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 - Dr Heber [place] Return A Bathing/ Shower/ Hygiene May shower and wash wound with soap and water. - Dial gold antibacterial soap Edema Control - Lymphedema / SCD /  Other Other Edema Control Orders/Instructions: - secure bandage with ace wraps and binder Wound Treatment Wound #1 - Abdomen - Upper Quadrant Wound Laterality: Right Cleanser: Soap and Water 1 x Per Day/15 Days Discharge Instructions: May shower and wash wound with dial antibacterial soap and water prior to dressing change. Prim Dressing:  Xeroform Occlusive Gauze Dressing, 4x4 in (DME) (Generic) 1 x Per Day/15 Days ary Discharge Instructions: Apply to wound bed as instructed Secondary Dressing: ABD Pad, 8x10 (DME) (Generic) 1 x Per Day/15 Days Discharge Instructions: Apply over primary dressing as directed. Secured With: Elastic Bandage 4 inch (ACE bandage) (DME) (Generic) 1 x Per Day/15 Days Discharge Instructions: Secure with ACE bandage as directed. Wound #2 - Abdomen - midline Wound Laterality: Right Cleanser: Soap and Water 1 x Per [id number] Days Discharge Instructions: May shower and wash wound with dial antibacterial soap and water prior to dressing change. Prim Dressing: Xeroform Occlusive Gauze Dressing, 4x4 in (DME) (Generic) 1 x Per Day/15 Days ary Discharge Instructions: Apply to wound bed as instructed Secondary Dressing: ABD Pad, 8x10 (DME) (Generic) 1 x Per Day/15 Days Discharge Instructions: Apply over primary dressing as directed. Secured With: Elastic Bandage 4 inch (ACE bandage) (DME) (Generic) 1 x Per Day/15 Days Discharge Instructions: Secure with ACE bandage as directed. Wound #3 - Abdomen - Upper Quadrant Wound Laterality: Left Cleanser: Soap and Water 1 x Per Day/15 Days Discharge Instructions: May shower and wash wound with dial antibacterial soap and water prior to dressing change. Prim Dressing: Xeroform Occlusive Gauze Dressing, 4x4 in (DME) (Generic) 1 x Per Day/15 Days ary Discharge Instructions: Apply to wound bed as instructed Secondary Dressing: ABD Pad, 8x10 (DME) (Generic) 1 x Per Day/15 Days Discharge Instructions: Apply over primary dressing as directed. [place], Vicente Males ([id number]) [id number] Page 5 of 11 Secured With: Elastic Bandage 4 inch (ACE bandage) (DME) (Generic) 1 x Per Day/15 Days Discharge Instructions: Secure with ACE bandage as directed. Wound #4 - Abdomen - Lower Quadrant Wound Laterality: Left Cleanser: Soap and Water 1 x Per Day/15 Days Discharge  Instructions: May shower and wash wound with dial antibacterial soap and water prior to dressing change. Prim Dressing: Xeroform Occlusive Gauze Dressing, 4x4 in (DME) (Generic) 1 x Per Day/15 Days ary Discharge Instructions: Apply to wound bed as instructed Secondary Dressing: ABD Pad, 8x10 (DME) (Generic) 1 x Per Day/15 Days Discharge Instructions: Apply over primary dressing as directed. Secured With: Elastic Bandage 4 inch (ACE bandage) (DME) (Generic) 1 x Per Day/15 Days Discharge Instructions: Secure with ACE bandage as directed. Patient Medications llergies: ibuprofen A Notifications Medication Indication Start End 07/26/2022 lidocaine DOSE topical 4 % cream - cream topical once daily 07/26/2022 mupirocin DOSE 1 - topical 2 % ointment - apply once daily to the affected areas Electronic Signature(s) Signed: 07/26/2022 3:14:47 PM By: Kalman Shan DO Entered By: Kalman Shan on 07/26/2022 15:14:46 -------------------------------------------------------------------------------- Problem List Details Patient Name: Date of Service: Patricia Lutz NNA 07/26/2022 12:45 PM Medical Record Number: [id number] Patient Account Number: [id number] Date of Birth/Sex: Treating RN: 10-19-1985 (37 y.o. 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 F) Primary Care Provider: Oletta Darter, MA RIA Other Clinician: Referring Provider: Treating Provider/Extender: Shawna Clamp in Treatment: 0 Subjective Chief Complaint Information obtained from Patient 07/26/2022; scattered abdominal wounds limited to skin breakdown History of Present Illness (HPI) 07/26/2022 Ms. Patricia Lutz is a 37 year old female with a past medical history of bladder issues with chronic indwelling Foley catheter, liver transplant, diabetes insipidus and adrenal insufficiency that presents the clinic for an ongoing issue with skin breakdown to her abdomen. This occurs when her bladder becomes distended and her abdomen swells. She has been using Silvadene to the wound beds. She currently denies signs of infection. Patient History Information obtained from Patient, Chart. Allergies ibuprofen Family History Unknown History. Social History Never smoker, Marital Status - Separated, Alcohol Use - Never, Drug Use - Prior History, Caffeine Use - Daily. Medical History Eyes Patient has history of Glaucoma Hematologic/Lymphatic Patient has history of Anemia Gastrointestinal Patient has history of Cirrhosis Denies history of Hepatitis B, Hepatitis C Endocrine Patient has history of Type I Diabetes Denies history of Type Lutz Diabetes Psychiatric Patient has history of Confinement Anxiety Patient is treated with Insulin. Blood sugar is tested. Hospitalization/Surgery History - C-section. - Liver transplant. 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 Denies complaints or symptoms of Marked Weight Change. Eyes Complains or has symptoms of Glasses / Contacts. Denies complaints or symptoms of Dry Eyes, Vision Changes. Ear/Nose/Mouth/Throat Denies complaints or symptoms of Chronic sinus problems or rhinitis. Respiratory Denies complaints or symptoms of Chronic or frequent coughs, Shortness of Breath. Cardiovascular Denies complaints or symptoms of Chest pain. Gastrointestinal Complains or has symptoms of Frequent diarrhea. Endocrine hypothyroidism; Hashimoto's Disease Genitourinary Denies complaints or symptoms of Frequent urination, foley catheter- non functioning bladder Immunological Addisons Integumentary (Skin) Complains or has symptoms of Wounds - stomach. Musculoskeletal Complains or has symptoms of Muscle Pain. Denies complaints or symptoms of Muscle Weakness, joint pain from meds Neurologic Denies complaints or symptoms of Numbness/parasthesias. 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 Vitals Time Taken: 1:03 PM, Height: 61 in, Source: Stated, Weight: 179 lbs, Source: Stated, BMI: 33.8, Temperature: 98.4 F, Pulse: 89 bpm, Respiratory Rate: 18 breaths/min, Blood Pressure: 134/90 mmHg, Capillary Blood Glucose: 56 mg/dl. Psychiatric pleasant and cooperative. General Notes: Abdomen: Several scattered open wounds limited to skin breakdown. No signs of surrounding soft tissue infection. Abdomen soft and nondistended. Integumentary (Hair, Skin) Wound #1 status is Open. Original cause of wound was Gradually  Appeared. The date acquired was: 05/21/2022. The wound is located on the Right Abdomen - Upper Quadrant. The wound measures 0.5cm length x 10cm width x 0.1cm depth; 3.927cm^2 area and 0.393cm^3 volume. There is Fat Layer (Subcutaneous Tissue) exposed. There is no tunneling or undermining noted. There is a medium amount of serosanguineous drainage noted. There is large (67-100%) red granulation within the  wound bed. There is a small (1-33%) amount of necrotic tissue within the wound bed including Adherent Slough. Wound #2 status is Open. Original cause of wound was Gradually Appeared. The date acquired was: 05/21/2022. The wound is located on the Right Abdomen - midline. The wound measures 3cm length x 8cm width x 0.1cm depth; 18.85cm^2 area and 1.885cm^3 volume. There is Fat Layer (Subcutaneous Tissue) exposed. There is no tunneling or undermining noted. There is a medium amount of serosanguineous drainage noted. There is large (67-100%) red granulation within the wound bed. There is a small (1-33%) amount of necrotic tissue within the wound bed including Adherent Slough. Wound #3 status is Open. Original cause of wound was Gradually Appeared. The date acquired was: 05/21/2022. The wound is located on the Left Abdomen - Upper Quadrant. The wound measures 0.3cm length x 0.3cm width x 0.1cm depth; 0.071cm^2 area and 0.007cm^3 volume. There is Fat Layer (Subcutaneous Tissue) exposed. There is no tunneling or undermining noted. There is a medium amount of serosanguineous drainage noted. There is large (67-100%) red granulation within the wound bed. There is a small (1-33%) amount of necrotic tissue within the wound bed including Adherent Slough. Wound #4 status is Open. Original cause of wound was Gradually Appeared. The date acquired was: 05/21/2022. The wound is located on the Left Abdomen - Lower Quadrant. The wound measures 3.3cm length x 5cm width x 0.1cm depth; 12.959cm^2 area and 1.296cm^3 volume. There is  Fat Layer (Subcutaneous Tissue) exposed. There is no tunneling or undermining noted. There is a medium amount of serosanguineous drainage noted. There is large (67-100%) red granulation within the wound bed. There is a small (1-33%) amount of necrotic tissue within the wound bed including Adherent Slough. Assessment Active Problems ICD-10 Unspecified open wound of abdominal wall, unspecified quadrant without penetration into peritoneal cavity, initial encounter Infection and inflammatory reaction due to indwelling urethral catheter, initial encounter Non-pressure chronic ulcer of skin of other sites limited to breakdown of skin Patient presents with several scattered open wounds to her abdomen caused by distention due to bladder dysfunction. She is following urology for this issue. At this time I recommended Xeroform to use daily. Will also prescribe her mupirocin ointment to have in case these areas become infected and she can start this. She describes a history of prior infection to the sites. Overall they appear well-healing and if her distention can be controlled she would not have wounds. She states she is following with urology as well. Procedures Wound #1 Pre-procedure diagnosis of Wound #1 is a Lesion located on the Right Abdomen - Upper Quadrant . There was a Chemical/Enzymatic/Mechanical debridement performed by Sharyn Creamer, RN.. Other agent used was wound cleanser and gauze. There was no bleeding. The procedure was tolerated well. Post Debridement Measurements: 0.5cm length x 10cm width x 0.1cm depth; 0.393cm^3 volume. Character of Wound/Ulcer Post Debridement is stable. Post procedure Diagnosis Wound #1: Same as Pre-Procedure Wound #2 Pre-procedure diagnosis of Wound #2 is a Lesion located on the Right Abdomen - midline . There was a Chemical/Enzymatic/Mechanical debridement performed by Sharyn Creamer, RN.. Other agent used was wound cleanser and gauze. There was no bleeding.  The procedure was tolerated well. Post Debridement Measurements: 3cm length x 8cm width x 0.1cm depth; 1.885cm^3 volume. Character of Wound/Ulcer Post Debridement is stable. Post procedure Diagnosis Wound #2: Same as Pre-Procedure Sampson Goon ([id number]) [id number] Page 8 of 11 Wound #3 Pre-procedure diagnosis of Wound #3 is a Lesion located on the Left Abdomen -  Upper Quadrant . There was a Chemical/Enzymatic/Mechanical debridement performed by Sharyn Creamer, RN.. Other agent used was wound cleanser and gauze. There was no bleeding. The procedure was tolerated well. Post Debridement Measurements: 0.3cm length x 0.3cm width x 0.1cm depth; 0.007cm^3 volume. Character of Wound/Ulcer Post Debridement is stable. Post procedure Diagnosis Wound #3: Same as Pre-Procedure Wound #4 Pre-procedure diagnosis of Wound #4 is a Lesion located on the Left Abdomen - Lower Quadrant . There was a Chemical/Enzymatic/Mechanical debridement performed by Sharyn Creamer, RN.. Other agent used was wound cleanser and gauze. There was no bleeding. The procedure was tolerated well. Post Debridement Measurements: 3.3cm length x 5cm width x 0.1cm depth; 1.296cm^3 volume. Character of Wound/Ulcer Post Debridement is stable. Post procedure Diagnosis Wound #4: Same as Pre-Procedure Plan Follow-up Appointments: Return Appointment in 2 weeks. - Dr Heber [place] Bathing/ Shower/ Hygiene: May shower and wash wound with soap and water. - Dial gold antibacterial soap Edema Control - Lymphedema / SCD / Other: Other Edema Control Orders/Instructions: - secure bandage with ace wraps and binder The following medication(s) was prescribed: lidocaine topical 4 % cream cream topical once daily was prescribed at facility mupirocin topical 2 % ointment 1 apply once daily to the affected areas starting 07/26/2022 WOUND #1: - Abdomen - Upper Quadrant Wound Laterality: Right Cleanser: Soap and Water 1 x Per Day/15  Days Discharge Instructions: May shower and wash wound with dial antibacterial soap and water prior to dressing change. Prim Dressing: Xeroform Occlusive Gauze Dressing, 4x4 in (DME) (Generic) 1 x Per Day/15 Days ary Discharge Instructions: Apply to wound bed as instructed Secondary Dressing: ABD Pad, 8x10 (DME) (Generic) 1 x Per Day/15 Days Discharge Instructions: Apply over primary dressing as directed. Secured With: Elastic Bandage 4 inch (ACE bandage) (DME) (Generic) 1 x Per Day/15 Days Discharge Instructions: Secure with ACE bandage as directed. WOUND #2: - Abdomen - midline Wound Laterality: Right Cleanser: Soap and Water 1 x Per [id number] Days Discharge Instructions: May shower and wash wound with dial antibacterial soap and water prior to dressing change. Prim Dressing: Xeroform Occlusive Gauze Dressing, 4x4 in (DME) (Generic) 1 x Per Day/15 Days ary Discharge Instructions: Apply to wound bed as instructed Secondary Dressing: ABD Pad, 8x10 (DME) (Generic) 1 x Per Day/15 Days Discharge Instructions: Apply over primary dressing as directed. Secured With: Elastic Bandage 4 inch (ACE bandage) (DME) (Generic) 1 x Per Day/15 Days Discharge Instructions: Secure with ACE bandage as directed. WOUND #3: - Abdomen - Upper Quadrant Wound Laterality: Left Cleanser: Soap and Water 1 x Per [id number] Days Discharge Instructions: May shower and wash wound with dial antibacterial soap and water prior to dressing change. Prim Dressing: Xeroform Occlusive Gauze Dressing, 4x4 in (DME) (Generic) 1 x Per Day/15 Days ary Discharge Instructions: Apply to wound bed as instructed Secondary Dressing: ABD Pad, 8x10 (DME) (Generic) 1 x Per Day/15 Days Discharge Instructions: Apply over primary dressing as directed. Secured With: Elastic Bandage 4 inch (ACE bandage) (DME) (Generic) 1 x Per Day/15 Days Discharge Instructions: Secure with ACE bandage as directed. WOUND #4: - Abdomen - Lower Quadrant Wound Laterality:  Left Cleanser: Soap and Water 1 x Per [id number] Days Discharge Instructions: May shower and wash wound with dial antibacterial soap and water prior to dressing change. Prim Dressing: Xeroform Occlusive Gauze Dressing, 4x4 in (DME) (Generic) 1 x Per Day/15 Days ary Discharge Instructions: Apply to wound bed as instructed Secondary Dressing: ABD Pad, 8x10 (DME) (Generic) 1 x Per Day/15 Days Discharge Instructions: Apply over primary  dressing as directed. Secured With: Elastic Bandage 4 inch (ACE bandage) (DME) (Generic) 1 x Per Day/15 Days Discharge Instructions: Secure with ACE bandage as directed. 1. Xeroform 2. Follow-up in 2 weeks 3. Mupirocin ointment to use only when areas become erythematous and painful Electronic Signature(s) Signed: 07/26/2022 5:08:37 PM By: Sharyn Creamer RN, BSN Signed: 07/27/2022 11:02:34 AM By: Kalman Shan DO Previous Signature: 07/26/2022 4:58:12 PM Version By: Kalman Shan DO Entered By: Sharyn Creamer on 07/26/2022 16:59:26 Bresee, Ayan ([id number]) [id number] Page 9 of 11 -------------------------------------------------------------------------------- HxROS Details Patient Name: Date of Service: Patricia Lutz 07/26/2022 12:45 PM Medical Record Number: [id number] Patient Account Number: [id number] Date of Birth/Sex: Treating RN: [birth year] (37 y.o. Patricia Lutz Primary Care Provider: Oletta Darter, MA RIA Other Clinician: Referring Provider: Treating Provider/Extender: Shawna Clamp in Treatment: 0 Information Obtained From Patient Chart Constitutional Symptoms (General Health) Complaints and Symptoms: Positive for: Fatigue; Fever; Chills Negative for: Marked Weight Change Eyes Complaints and Symptoms: Positive for: Glasses / Contacts Negative for: Dry Eyes; Vision Changes Medical History: Positive for: Glaucoma Ear/Nose/Mouth/Throat Complaints and Symptoms: Negative for: Chronic sinus  problems or rhinitis Respiratory Complaints and Symptoms: Negative for: Chronic or frequent coughs; Shortness of Breath Cardiovascular Complaints and Symptoms: Negative for: Chest pain Gastrointestinal Complaints and Symptoms: Positive for: Frequent diarrhea Medical History: Positive for: Cirrhosis Negative for: Hepatitis B; Hepatitis C Past Medical History Notes: hx of stomach ulcers; 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 Genitourinary Complaints and Symptoms: Negative for: Frequent urination Review of System Notes: foley catheter- non functioning bladder Integumentary (Skin) Complaints and Symptoms: Positive for: Wounds - stomach Musculoskeletal Complaints and Symptoms: Positive for: Muscle Pain Negative for: Muscle Weakness Review of System Notes: joint pain from meds Neurologic Complaints and Symptoms: Negative for: Numbness/parasthesias Sampson Goon ([id number]) [id number] Page 10 of 11 Hematologic/Lymphatic Medical History: Positive for: Anemia Endocrine Complaints and Symptoms: Review of System Notes: hypothyroidism; Hashimoto's Disease Medical History: Positive for: Type I Diabetes Negative for: Type Lutz Diabetes Treated with: Insulin Blood sugar tested every day: Yes Tested : Immunological Complaints and Symptoms: Review of System Notes: Addisons Oncologic Complaints and Symptoms: Review of System Notes: 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 Psychiatric Complaints and Symptoms: Review of System Notes: depression Medical History: Positive for: Confinement Anxiety HBO Extended History Items Eyes: Glaucoma Immunizations Pneumococcal Vaccine: Received Pneumococcal Vaccination: No Implantable Devices None Hospitalization / Surgery History Type of Hospitalization/Surgery C-section Liver transplant Family and Social History Unknown History: Yes; Never smoker; Marital Status - Separated; Alcohol Use: Never; Drug Use: Prior  History; Caffeine Use: Daily; Financial Concerns: No; Food, Clothing or Shelter Needs: No; Support System Lacking: No; Transportation Concerns: No Electronic Signature(s) Signed: 07/26/2022 4:33:02 PM By: Rhae Hammock RN Signed: 07/26/2022 4:58:12 PM By: Kalman Shan DO Signed: 07/26/2022 5:08:37 PM By: Sharyn Creamer RN, BSN Entered By: Sharyn Creamer on 07/26/2022 13:57:00 -------------------------------------------------------------------------------- Patricia Lutz Details Patient Name: Date of Service: Patricia Lutz NNA 07/26/2022 Medical Record Number: [id number] Patient Account Number: [id number] Date of Birth/Sex: Treating RN: 1986/02/03 (37 y.o. 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  Facility Procedures : CPT4 Code: [id number] Description: [place] VISIT-LEV 4 EST PT Modifier: 25 Quantity: 1 : CPT4 Code: [id number] Description: [id number] - [place] W/O ANES NON SELECT Modifier: Quantity: 1 Physician Procedures : CPT4 Code Description Modifier [id number] - WC PHYS LEVEL 4 - NEW PT ICD-10 Diagnosis Description [id number] Unspecified open wound of abdominal wall, unspecified quadrant without penetration into peritoneal cavit encounter T83.511A Infection and  inflammatory reaction due to indwelling urethral catheter, initial encounter 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

## 2022-07-28 NOTE — Progress Notes (Signed)
[employer], Vicente Males ([id number]) [phone] [id number] Page 1 of 4 Visit Report for 07/26/2022 Abuse Risk Screen Details Patient Name: Date of Service: Patricia Lutz 07/26/2022 12:45 PM Medical Record Number: [id number] Patient Account Number: [id number] Date of Birth/Sex: Treating RN: Aug 17, 1985 (37 y.o. Patricia Lutz Primary Care Haydin Dunn: Oletta Darter, MA RIA Other Clinician: Referring Arieona Swaggerty: Treating Tag Wurtz/Extender: Shawna Clamp in Treatment: 0 Abuse Risk Screen Items Answer ABUSE RISK SCREEN: Has anyone close to you tried to hurt or harm you recentlyo No Do you feel uncomfortable with anyone in your familyo No Has anyone forced you do things that you didnt want to doo No Electronic Signature(s) Signed: 07/26/2022 5:08:37 PM By: Sharyn Creamer RN, BSN Entered By: Sharyn Creamer on 07/26/2022 13:12:19 -------------------------------------------------------------------------------- Activities of Daily Living Details Patient Name: Date of Service: Patricia Lutz 07/26/2022 12:45 PM Medical Record Number: [id number] Patient Account Number: [id number] Date of Birth/Sex: Treating RN: 19-Nov-1985 (37 y.o. Patricia Lutz Primary Care Janard Culp: Oletta Darter, MA RIA Other Clinician: Referring Adalynne Steffensmeier: Treating Kvon Mcilhenny/Extender: Shawna Clamp in Treatment: 0 Activities of Daily Living Items Answer Activities of Daily Living (Please select one for each item) Drive Automobile Completely Able T Medications ake Completely Able Use T elephone Completely Able Care for Appearance Completely Able Use T oilet Completely Able Bath / Shower Completely Able Dress Self Completely Able Feed Self Completely Able Walk Completely Able Get In / Out Bed Completely Able Housework Completely Able Prepare Meals Completely Able Handle Money Completely Able Shop for Self Completely Able Electronic Signature(s) Signed: 07/26/2022  5:08:37 PM By: Sharyn Creamer RN, BSN Entered By: Sharyn Creamer on 07/26/2022 13:12:45 -------------------------------------------------------------------------------- Education Screening Details Patient Name: Date of Service: Patricia Lutz 07/26/2022 12:45 PM Medical Record Number: [id number] Patient Account Number: [id number] Date of Birth/Sex: Treating RN: Apr 17, 1986 (37 y.o. Patricia Lutz Primary Care Amandamarie Feggins: Oletta Darter, MA RIA Other Clinician: Referring Blase Beckner: Treating Clayton Bosserman/Extender: Shawna Clamp in Treatment: 0 [place], Angelin ([id number]) [phone].pdf Page 2 of 4 Learning Preferences/Education Level/Primary Language Learning Preference: Explanation, Demonstration, Printed Material Highest Education Level: College or Above Preferred Language: Diplomatic Services operational officer Language Barrier: No Translator Needed: No Memory Deficit: No Emotional Barrier: No Cultural/Religious Beliefs Affecting Medical Care: No Physical Barrier Impaired Vision: No Impaired Hearing: No Decreased Hand dexterity: No Knowledge/Comprehension Knowledge Level: High Comprehension Level: High Ability to understand written instructions: High Ability to understand verbal instructions: High Motivation Anxiety Level: Calm Cooperation: Cooperative Education Importance: Acknowledges Need Interest in Health Problems: Asks Questions Perception: Coherent Willingness to Engage in Self-Management High Activities: Readiness to Engage in Self-Management High Activities: Electronic Signature(s) Signed: 07/26/2022 5:08:37 PM By: Sharyn Creamer RN, BSN Entered By: Sharyn Creamer on 07/26/2022 13:13:11 -------------------------------------------------------------------------------- Fall Risk Assessment Details Patient Name: Date of Service: Patricia Lutz 07/26/2022 12:45 PM Medical Record Number: [id number] Patient Account Number: [id number] Date of  Birth/Sex: Treating RN: 13-Aug-1985 (37 y.o. Patricia Lutz Primary Care Manhattan Mccuen: Oletta Darter, MA RIA Other Clinician: Referring Floris Neuhaus: Treating Laurie Lovejoy/Extender: Shawna Clamp in Treatment: 0 Fall Risk Assessment Items Have you had 2 or more falls in the last 12 monthso 0 Yes Have you had any fall that resulted in injury in the last 12 monthso 0 No FALLS RISK SCREEN History of falling - immediate or within 3 months 25 Yes Secondary diagnosis (Do you have 2 or more medical diagnoseso) 0 No Ambulatory aid None/bed rest/wheelchair/nurse 0 Yes  Crutches/cane/walker 0 No Furniture 0 No Intravenous therapy Access/Saline/Heparin Lock 0 No Gait/Transferring Normal/ bed rest/ wheelchair 0 Yes Weak (short steps with or without shuffle, stooped but able to lift head while walking, may seek 0 No support from furniture) Impaired (short steps with shuffle, may have difficulty arising from chair, head down, impaired 0 No balance) Mental Status Oriented to own ability 0 Yes Overestimates or forgets limitations 0 No Risk Level: Medium Risk Score: 25 Patricia Lutz, Patricia Lutz ([id number]) [id number] Page 3 of 4 Electronic Signature(s) -------------------------------------------------------------------------------- Foot Assessment Details Patient Name: Date of Service: Patricia Lutz 07/26/2022 12:45 PM Medical Record Number: [id number] Patient Account Number: [id number] Date of Birth/Sex: Treating RN: Oct 09, 1985 (37 y.o. Patricia Lutz Primary Care Damiean Lukes: Oletta Darter, MA RIA Other Clinician: Referring Aarin Bluett: Treating Chalmers Iddings/Extender: Shawna Clamp in Treatment: 0 Foot Assessment Items Site Locations + = Sensation present, - = Sensation absent, C = Callus, U = Ulcer R = Redness, W = Warmth, M = Maceration, PU = Pre-ulcerative lesion F = Fissure, S = Swelling, D = Dryness Assessment Right: Left: Other  Deformity: No No Prior Foot Ulcer: No No Prior Amputation: No No Charcot Joint: No No Ambulatory Status: Gait: Electronic Signature(s) Signed: 07/26/2022 5:08:37 PM By: Sharyn Creamer RN, BSN Entered By: Sharyn Creamer on 07/26/2022 13:14:56 -------------------------------------------------------------------------------- Nutrition Risk Screening Details Patient Name: Date of Service: Patricia Lutz 07/26/2022 12:45 PM Medical Record Number: [id number] Patient Account Number: [id number] Date of Birth/Sex: Treating RN: 1986-04-02 (37 y.o. Patricia Lutz Primary Care Bambi Fehnel: Oletta Darter, MA RIA Other Clinician: Referring Mada Sadik: Treating Jinnifer Montejano/Extender: Shawna Clamp in Treatment: 0 Height (in): 61 Weight (lbs): 179 Body Mass Index (BMI): 33.8 Patricia Lutz, Patricia Lutz ([id number]) [id number] Page 4 of 4 Nutrition Risk Screening Items Score Screening NUTRITION RISK SCREEN: I have an illness or condition that made me change the kind and/or amount of food I eat 0 No I eat fewer than two meals per day 0 No I eat few fruits and vegetables, or milk products 0 No I have three or more drinks of beer, liquor or wine almost every day 0 No I have tooth or mouth problems that make it hard for me to eat 0 No I don't always have enough money to buy the food I need 0 No I eat alone most of the time 0 No I take three or more different prescribed or over-the-counter drugs a day 1 Yes Without wanting to, I have lost or gained 10 pounds in the last six months 0 No I am not always physically able to shop, cook and/or feed myself 0 No Nutrition Protocols Good Risk Protocol Moderate Risk Protocol High Risk Proctocol Risk Level: Good Risk Score: 1 Electronic Signature(s) Signed: 07/26/2022 5:08:37 PM By: Sharyn Creamer RN, BSN Entered By: Sharyn Creamer on 07/26/2022 13:14:47

## 2022-07-28 NOTE — Progress Notes (Addendum)
[name], Patricia Lutz ([id number]) [id number] Page 1 of 13 Visit Report for 07/26/2022 Allergy List Details Patient Name: Date of Service: Patricia Lutz 07/26/2022 12:45 PM Medical Record Number: [id number] Patient Account Number: [id number] Date of Birth/Sex: Treating RN: 1986/02/01 (37 y.o. Patricia Lutz, Patricia Lutz Primary Care Dalayla Aldredge: Neta Mends, MA RIA Other Clinician: Referring Valissa Lyvers: Treating Bryonna Sundby/Extender: Lockie Mola in Treatment: 0 Allergies Active Allergies ibuprofen Allergy Notes Electronic Signature(s) Signed: 07/26/2022 5:08:37 PM By: Redmond Pulling RN, BSN Entered By: Redmond Pulling on 07/26/2022 13:05:02 -------------------------------------------------------------------------------- Arrival Information Details Patient Name: Date of Service: [name] NNA 07/26/2022 12:45 PM Medical Record Number: [id number] Patient Account Number: [id number] Date of Birth/Sex: Treating RN: February 17, 1986 (37 y.o. Patricia Lutz Primary Care Rylynne Schicker: Neta Mends, MA RIA Other Clinician: Referring Murry Khiev: Treating Maida Widger/Extender: Lockie Mola in Treatment: 0 Visit Information Patient Arrived: Ambulatory Arrival Time: 12:52 Accompanied By: self Transfer Assistance: None Patient Identification Verified: Yes Secondary Verification Process Completed: Yes Electronic Signature(s) Signed: 07/26/2022 5:08:37 PM By: Redmond Pulling RN, BSN Entered By: Redmond Pulling on 07/26/2022 13:00:40 -------------------------------------------------------------------------------- Clinic Level of Care Assessment Details Patient Name: Date of Service: [name] NNA 07/26/2022 12:45 PM Medical Record Number: [id number] Patient Account Number: [id number] Date of Birth/Sex: Treating RN: 1986/05/13 (37 y.o. Patricia Lutz Primary Care Randee Huston: Neta Mends, MA RIA Other Clinician: Jacob Moores ([id number])  [id number] Page 2 of 13 Referring Braelynn Benning: Treating Rilley Poulter/Extender: Lockie Mola in Treatment: 0 Clinic Level of Care Assessment Items TOOL 4 Quantity Score X- 1 0 Use when only an EandM is performed on FOLLOW-UP visit ASSESSMENTS - Nursing Assessment / Reassessment X- 1 10 Reassessment of Co-morbidities (includes updates in patient status) X- 1 5 Reassessment of Adherence to Treatment Plan ASSESSMENTS - Wound and Skin A ssessment / Reassessment []  - 0 Simple Wound Assessment / Reassessment - one wound X- 4 5 Complex Wound Assessment / Reassessment - multiple wounds []  - 0 Dermatologic / Skin Assessment (not related to wound area) ASSESSMENTS - Focused Assessment []  - 0 Circumferential Edema Measurements - multi extremities []  - 0 Nutritional Assessment / Counseling / Intervention []  - 0 Lower Extremity Assessment (monofilament, tuning fork, pulses) []  - 0 Peripheral Arterial Disease Assessment (using hand held doppler) ASSESSMENTS - Ostomy and/or Continence Assessment and Care []  - 0 Incontinence Assessment and Management []  - 0 Ostomy Care Assessment and Management (repouching, etc.) PROCESS - Coordination of Care X - Simple Patient / Family Education for ongoing care 1 15 []  - 0 Complex (extensive) Patient / Family Education for ongoing care X- 1 10 Staff obtains Chiropractor, Records, T Results / Process Orders est []  - 0 Staff telephones HHA, Nursing Homes / Clarify orders / etc []  - 0 Routine Transfer to another Facility (non-emergent condition) []  - 0 Routine Hospital Admission (non-emergent condition) X- 1 15 New Admissions / Manufacturing engineer / Ordering NPWT Apligraf, etc. , []  - 0 Emergency Hospital Admission (emergent condition) []  - 0 Simple Discharge Coordination []  - 0 Complex (extensive) Discharge Coordination PROCESS - Special Needs []  - 0 Pediatric / Minor Patient Management []  -  0 Isolation Patient Management []  - 0 Hearing / Language / Visual special needs []  - 0 Assessment of Community assistance (transportation, D/C planning, etc.) []  - 0 Additional assistance / Altered mentation []  - 0 Support Surface(s) Assessment (bed, cushion, seat, etc.) INTERVENTIONS - Wound Cleansing / Measurement []  - 0 Simple Wound Cleansing - one wound  X- 4 5 Complex Wound Cleansing - multiple wounds X- 1 5 Wound Imaging (photographs - any number of wounds) []  - 0 Wound Tracing (instead of photographs) []  - 0 Simple Wound Measurement - one wound X- 4 5 Complex Wound Measurement - multiple wounds INTERVENTIONS - Wound Dressings Wolcott, Joleen ([id number]) [id number] Page 3 of 13 []  - 0 Small Wound Dressing one or multiple wounds []  - 0 Medium Wound Dressing one or multiple wounds X- 1 20 Large Wound Dressing one or multiple wounds []  - 0 Application of Medications - topical []  - 0 Application of Medications - injection INTERVENTIONS - Miscellaneous []  - 0 External ear exam []  - 0 Specimen Collection (cultures, biopsies, blood, body fluids, etc.) []  - 0 Specimen(s) / Culture(s) sent or taken to Lab for analysis []  - 0 Patient Transfer (multiple staff / Nurse, adult / Similar devices) []  - 0 Simple Staple / Suture removal (25 or less) []  - 0 Complex Staple / Suture removal (26 or more) []  - 0 Hypo / Hyperglycemic Management (close monitor of Blood Glucose) []  - 0 Ankle / Brachial Index (ABI) - do not check if billed separately X- 1 5 Vital Signs Has the patient been seen at the hospital within the last three years: Yes Total Score: 145 Level Of Care: New/Established - Level 4 Electronic Signature(s) Signed: 07/26/2022 5:08:37 PM By: Redmond Pulling RN, BSN Entered By: Redmond Pulling on 07/26/2022 14:38:40 -------------------------------------------------------------------------------- Encounter Discharge Information Details Patient  Name: Date of Service: [name] NNA 07/26/2022 12:45 PM Medical Record Number: [id number] Patient Account Number: [id number] Date of Birth/Sex: Treating RN: [birth year] (37 y.o. Patricia Lutz Primary Care Kamela Blansett: Neta Mends, MA RIA Other Clinician: Referring Nicolina Hirt: Treating Stamatia Masri/Extender: Lockie Mola in Treatment: 0 Encounter Discharge Information Items Discharge Condition: Stable Ambulatory Status: Ambulatory Discharge Destination: Home Transportation: Private Auto Accompanied By: self Schedule Follow-up Appointment: Yes Clinical Summary of Care: Patient Declined Electronic Signature(s) Signed: 07/26/2022 5:08:37 PM By: Redmond Pulling RN, BSN Entered By: Redmond Pulling on 07/26/2022 14:39:14 -------------------------------------------------------------------------------- Lower Extremity Assessment Details Patient Name: Date of Service: [name] NNA 07/26/2022 12:45 PM Azucena Cecil, Alizia ([id number]) [id number] Page 4 of 13 Medical Record Number: [id number] Patient Account Number: [id number] Date of Birth/Sex: Treating RN: [birth year] (37 y.o. Patricia Lutz Primary Care Islay Polanco: Neta Mends, MA RIA Other Clinician: Referring Tiann Saha: Treating Taiya Nutting/Extender: Lockie Mola in Treatment: 0 Electronic Signature(s) Signed: 07/26/2022 5:08:37 PM By: Redmond Pulling RN, BSN Entered By: Redmond Pulling on 07/26/2022 13:15:12 -------------------------------------------------------------------------------- Multi Wound Chart Details Patient Name: Date of Service: [name] NNA 07/26/2022 12:45 PM Medical Record Number: [id number] Patient Account Number: [id number] Date of Birth/Sex: Treating RN: [birth year] (36 y.o. F) Primary Care Tyray Proch: Neta Mends, MA RIA Other Clinician: Referring Royann Wildasin: Treating Sherrey North/Extender: Lockie Mola in Treatment: 0 Vital Signs Height(in):  61 Capillary Blood Glucose(mg/dl): 56 Weight(lbs): 244 Pulse(bpm): 89 Body Mass Index(BMI): 33.8 Blood Pressure(mmHg): 134/90 Temperature(F): 98.4 Respiratory Rate(breaths/min): 18 [1:Photos:] Right Abdomen - Upper Quadrant Right Abdomen - midline Left Abdomen - Upper Quadrant Wound Location: Gradually Appeared Gradually Appeared Gradually Appeared Wounding Event: Lymphedema Lesion Lesion Primary Etiology: Glaucoma, Anemia, Cirrhosis , Type II Glaucoma, Anemia, Cirrhosis , Type II Glaucoma, Anemia, Cirrhosis , Type II Comorbid History: Diabetes, Confinement Anxiety Diabetes, Confinement Anxiety Diabetes, Confinement Anxiety 05/21/2022 05/21/2022 05/21/2022 Date Acquired: 0 0 0 Weeks of Treatment: Open Open Open Wound Status: No No No Wound Recurrence: Yes Yes No  Clustered Wound: 3 4 N/A Clustered Quantity: 0.5x10x0.1 3x8x0.1 0.3x0.3x0.1 Measurements L x W x D (cm) 3.927 18.85 0.071 A (cm) : rea 0.393 1.885 0.007 Volume (cm) : Full Thickness Without Exposed Full Thickness Without Exposed Full Thickness Without Exposed Classification: Support Structures Support Structures Support Structures Medium Medium Medium Exudate Amount: Serosanguineous Serosanguineous Serosanguineous Exudate Type: red, brown red, brown red, brown Exudate Color: Large (67-100%) Large (67-100%) Large (67-100%) Granulation Amount: Red Red Red Granulation Quality: Small (1-33%) Small (1-33%) Small (1-33%) Necrotic Amount: Fat Layer (Subcutaneous Tissue): Yes Fat Layer (Subcutaneous Tissue): Yes Fat Layer (Subcutaneous Tissue): Yes Exposed Structures: Fascia: No Fascia: No Fascia: No Tendon: No Tendon: No Tendon: No Muscle: No Muscle: No Muscle: No Joint: No Joint: No Joint: No Bone: No Bone: No Bone: No None None None Epithelialization: Wound Number: 4 N/A N/A Photos: N/A Judeth Porch ([id number]) [id number] Page 5 of 13 Left Abdomen - Lower Quadrant  N/A N/A Wound Location: Gradually Appeared N/A N/A Wounding Event: Lesion N/A N/A Primary Etiology: Glaucoma, Anemia, Cirrhosis , Type II N/A N/A Comorbid History: Diabetes, Confinement Anxiety 05/21/2022 N/A N/A Date Acquired: 0 N/A N/A Weeks of Treatment: Open N/A N/A Wound Status: No N/A N/A Wound Recurrence: Yes N/A N/A Clustered Wound: 3 N/A N/A Clustered Quantity: 3.3x5x0.1 N/A N/A Measurements L x W x D (cm) 12.959 N/A N/A A (cm) : rea 1.296 N/A N/A Volume (cm) : Full Thickness Without Exposed N/A N/A Classification: Support Structures Medium N/A N/A Exudate Amount: Serosanguineous N/A N/A Exudate Type: red, brown N/A N/A Exudate Color: Large (67-100%) N/A N/A Granulation Amount: Red N/A N/A Granulation Quality: Small (1-33%) N/A N/A Necrotic Amount: Fat Layer (Subcutaneous Tissue): Yes N/A N/A Exposed Structures: Fascia: No Tendon: No Muscle: No Joint: No Bone: No None N/A N/A Epithelialization: Treatment Notes Wound #1 (Abdomen - Upper Quadrant) Wound Laterality: Right Cleanser Soap and Water Discharge Instruction: May shower and wash wound with dial antibacterial soap and water prior to dressing change. Peri-Wound Care Topical Primary Dressing Xeroform Occlusive Gauze Dressing, 4x4 in Discharge Instruction: Apply to wound bed as instructed Secondary Dressing ABD Pad, 8x10 Discharge Instruction: Apply over primary dressing as directed. Secured With Elastic Bandage 4 inch (ACE bandage) Discharge Instruction: Secure with ACE bandage as directed. Compression Wrap Compression Stockings Add-Ons Wound #2 (Abdomen - midline) Wound Laterality: Right Cleanser Soap and Water Discharge Instruction: May shower and wash wound with dial antibacterial soap and water prior to dressing change. Peri-Wound Care Topical Primary Dressing Xeroform Occlusive Gauze Dressing, 4x4 in Discharge Instruction: Apply to wound bed as instructed Jacob Moores  ([id number]) [id number] Page 6 of 13 Secondary Dressing ABD Pad, 8x10 Discharge Instruction: Apply over primary dressing as directed. Secured With Elastic Bandage 4 inch (ACE bandage) Discharge Instruction: Secure with ACE bandage as directed. Compression Wrap Compression Stockings Add-Ons Wound #3 (Abdomen - Upper Quadrant) Wound Laterality: Left Cleanser Soap and Water Discharge Instruction: May shower and wash wound with dial antibacterial soap and water prior to dressing change. Peri-Wound Care Topical Primary Dressing Xeroform Occlusive Gauze Dressing, 4x4 in Discharge Instruction: Apply to wound bed as instructed Secondary Dressing ABD Pad, 8x10 Discharge Instruction: Apply over primary dressing as directed. Secured With Elastic Bandage 4 inch (ACE bandage) Discharge Instruction: Secure with ACE bandage as directed. Compression Wrap Compression Stockings Add-Ons Wound #4 (Abdomen - Lower Quadrant) Wound Laterality: Left Cleanser Soap and Water Discharge Instruction: May shower and wash wound with dial antibacterial soap and water prior to dressing change. Peri-Wound Care Topical  Primary Dressing Xeroform Occlusive Gauze Dressing, 4x4 in Discharge Instruction: Apply to wound bed as instructed Secondary Dressing ABD Pad, 8x10 Discharge Instruction: Apply over primary dressing as directed. Secured With Elastic Bandage 4 inch (ACE bandage) Discharge Instruction: Secure with ACE bandage as directed. Compression Wrap Compression Stockings Add-Ons Electronic Signature(s) Signed: 07/26/2022 4:58:12 PM By: Geralyn Corwin DO Entered By: Geralyn Corwin on 07/26/2022 14:59:42 Kuzniar, Misty ([id number]) [id number] Page 7 of 13 -------------------------------------------------------------------------------- Multi-Disciplinary Care Plan Details Patient Name: Date of Service: Patricia Lutz 07/26/2022 12:45 PM Medical  Record Number: [id number] Patient Account Number: [id number] Date of Birth/Sex: Treating RN: 1985/09/20 (36 y.o. Patricia Lutz Primary Care Rainier Feuerborn: Neta Mends, MA RIA Other Clinician: Referring Willy Pinkerton: Treating Katisha Shimizu/Extender: Lockie Mola in Treatment: 0 Active Inactive Electronic Signature(s) Signed: 09/24/2022 3:16:21 PM By: Shawn Stall RN, BSN Signed: 11/20/2022 4:27:25 PM By: Redmond Pulling RN, BSN Previous Signature: 07/26/2022 5:08:37 PM Version By: Redmond Pulling RN, BSN Entered By: Shawn Stall on 09/24/2022 15:16:21 -------------------------------------------------------------------------------- Pain Assessment Details Patient Name: Date of Service: [name] NNA 07/26/2022 12:45 PM Medical Record Number: [id number] Patient Account Number: [id number] Date of Birth/Sex: Treating RN: 10/15/1985 (36 y.o. Patricia Lutz Primary Care Lucia Harm: Neta Mends, MA RIA Other Clinician: Referring Deosha Werden: Treating Janelle Spellman/Extender: Lockie Mola in Treatment: 0 Active Problems Location of Pain Severity and Description of Pain Patient Has Paino Yes Site Locations Rate the pain. Current Pain Level: 7 Pain Management and Medication Current Pain Management: Electronic Signature(s) Signed: 07/26/2022 5:08:37 PM By: Redmond Pulling RN, BSN Entered By: Redmond Pulling on 07/26/2022 13:36:02 Mcgourty, Yariela ([id number]) [id number] Page 8 of 13 -------------------------------------------------------------------------------- Patient/Caregiver Education Details Patient Name: Date of Service: [name] NNA 3/7/2024andnbsp12:45 PM Medical Record Number: [id number] Patient Account Number: [id number] Date of Birth/Gender: Treating RN: Oct 08, 1985 (37 y.o. Patricia Lutz Primary Care Physician: Neta Mends, MA RIA Other Clinician: Referring Physician: Treating Physician/Extender: Lockie Mola in Treatment: 0 Education Assessment Education Provided To: Patient Education Topics Provided Welcome T [facility]New [facility] Packet: o Methods: Explain/Verbal Responses: State content correctly Wound/Skin Impairment: Methods: Explain/Verbal Responses: State content correctly Electronic Signature(s) Signed: 07/26/2022 5:08:37 PM By: Redmond Pulling RN, BSN Entered By: Redmond Pulling on 07/26/2022 14:37:00 -------------------------------------------------------------------------------- Wound Assessment Details Patient Name: Date of Service: [name] NNA 07/26/2022 12:45 PM Medical Record Number: [id number] Patient Account Number: [id number] Date of Birth/Sex: Treating RN: 03/05/1986 (36 y.o. Patricia Lutz Primary Care Jordana Dugue: Neta Mends, MA RIA Other Clinician: Referring Baden Betsch: Treating Shantoya Geurts/Extender: Lockie Mola in Treatment: 0 Wound Status Wound Number: 1 Primary Lymphedema Etiology: Wound Location: Right Abdomen - Upper Quadrant Wound Status: Open Wounding Event: Gradually Appeared Comorbid Glaucoma, Anemia, Cirrhosis , Type II Diabetes, Date Acquired: 05/21/2022 History: Confinement Anxiety Weeks Of Treatment: 0 Clustered Wound: Yes Photos [place], Avneet ([id number]) [id number] Page 9 of 13 Wound Measurements Length: (cm) Width: (cm) Depth: (cm) Clustered Quantity: Area: (cm) Volume: (cm) 0.5 % Reduction in Area: 10 % Reduction in Volume: 0.1 Epithelialization: None 3 Tunneling: No 3.927 Undermining: No 0.393 Wound Description Classification: Full Thickness Without Exposed Supp Exudate Amount: Medium Exudate Type: Serosanguineous Exudate Color: red, brown ort Structures Foul Odor After Cleansing: No Slough/Fibrino Yes Wound Bed Granulation Amount: Large (67-100%) Exposed Structure Granulation Quality: Red Fascia Exposed: No Necrotic Amount: Small (1-33%) Fat Layer  (Subcutaneous Tissue) Exposed: Yes Necrotic Quality: Adherent Slough Tendon Exposed: No Muscle Exposed: No Joint  Exposed: No Bone Exposed: No Periwound Skin Texture Texture Color No Abnormalities Noted: No No Abnormalities Noted: No Moisture No Abnormalities Noted: No Electronic Signature(s) Signed: 07/26/2022 5:08:37 PM By: Redmond Pulling RN, BSN Entered By: Redmond Pulling on 07/26/2022 13:32:33 -------------------------------------------------------------------------------- Wound Assessment Details Patient Name: Date of Service: [name] NNA 07/26/2022 12:45 PM Medical Record Number: [id number] Patient Account Number: [id number] Date of Birth/Sex: Treating RN: Feb 27, 1986 (36 y.o. Patricia Lutz Primary Care Felica Chargois: Neta Mends, MA RIA Other Clinician: Referring Lemmie Vanlanen: Treating Thaddaeus Granja/Extender: Lockie Mola in Treatment: 0 Wound Status Wound Number: 2 Primary Lesion Etiology: Wound Location: Right Abdomen - midline Wound Status: Open Wounding Event: Gradually Appeared Comorbid Glaucoma, Anemia, Cirrhosis , Type II Diabetes, Date Acquired: 05/21/2022 History: Confinement Anxiety Weeks Of Treatment: 0 Clustered Wound: Yes Photos Hinckley, Bethanny ([id number]) [id number] Page 10 of 13 Wound Measurements Length: (cm) Width: (cm) Depth: (cm) Clustered Quantity: Area: (cm) Volume: (cm) 3 % Reduction in Area: 8 % Reduction in Volume: 0.1 Epithelialization: None 4 Tunneling: No 18.85 Undermining: No 1.885 Wound Description Classification: Full Thickness Without Exposed Supp Exudate Amount: Medium Exudate Type: Serosanguineous Exudate Color: red, brown ort Structures Foul Odor After Cleansing: No Slough/Fibrino Yes Wound Bed Granulation Amount: Large (67-100%) Exposed Structure Granulation Quality: Red Fascia Exposed: No Necrotic Amount: Small (1-33%) Fat Layer (Subcutaneous Tissue) Exposed: Yes Necrotic  Quality: Adherent Slough Tendon Exposed: No Muscle Exposed: No Joint Exposed: No Bone Exposed: No Periwound Skin Texture Texture Color No Abnormalities Noted: No No Abnormalities Noted: No Moisture No Abnormalities Noted: No Electronic Signature(s) Signed: 07/26/2022 5:08:37 PM By: Redmond Pulling RN, BSN Entered By: Redmond Pulling on 07/26/2022 13:33:06 -------------------------------------------------------------------------------- Wound Assessment Details Patient Name: Date of Service: [name] NNA 07/26/2022 12:45 PM Medical Record Number: [id number] Patient Account Number: [id number] Date of Birth/Sex: Treating RN: 1985-10-13 (36 y.o. Patricia Lutz Primary Care Sheila Ocasio: Neta Mends, MA RIA Other Clinician: Referring Miner Koral: Treating Arabelle Bollig/Extender: Lockie Mola in Treatment: 0 Wound Status Wound Number: 3 Primary Lesion Etiology: Wound Location: Left Abdomen - Upper Quadrant Wound Status: Open Wounding Event: Gradually Appeared Comorbid Glaucoma, Anemia, Cirrhosis , Type II Diabetes, Date Acquired: 05/21/2022 History: Confinement Anxiety Weeks Of Treatment: 0 Clustered Wound: No Photos [place], Patricia Lutz ([id number]) [id number] Page 11 of 13 Wound Measurements Length: (cm) 0.3 Width: (cm) 0.3 Depth: (cm) 0.1 Area: (cm) 0.071 Volume: (cm) 0.007 % Reduction in Area: % Reduction in Volume: Epithelialization: None Tunneling: No Undermining: No Wound Description Classification: Full Thickness Without Exposed Support Exudate Amount: Medium Exudate Type: Serosanguineous Exudate Color: red, brown Structures Foul Odor After Cleansing: No Slough/Fibrino Yes Wound Bed Granulation Amount: Large (67-100%) Exposed Structure Granulation Quality: Red Fascia Exposed: No Necrotic Amount: Small (1-33%) Fat Layer (Subcutaneous Tissue) Exposed: Yes Necrotic Quality: Adherent Slough Tendon Exposed: No Muscle Exposed:  No Joint Exposed: No Bone Exposed: No Periwound Skin Texture Texture Color No Abnormalities Noted: No No Abnormalities Noted: No Moisture No Abnormalities Noted: No Electronic Signature(s) Signed: 07/26/2022 5:08:37 PM By: Redmond Pulling RN, BSN Entered By: Redmond Pulling on 07/26/2022 13:33:34 -------------------------------------------------------------------------------- Wound Assessment Details Patient Name: Date of Service: [name] NNA 07/26/2022 12:45 PM Medical Record Number: [id number] Patient Account Number: [id number] Date of Birth/Sex: Treating RN: Mar 13, 1986 (36 y.o. Patricia Lutz Primary Care Dwanna Goshert: Neta Mends, MA RIA Other Clinician: Referring Ravis Herne: Treating Yaslin Kirtley/Extender: Lockie Mola in Treatment: 0 Wound Status Wound Number: 4 Primary Lesion Etiology: Wound Location: Left Abdomen -  Lower Quadrant Wound Status: Open Wounding Event: Gradually Appeared Comorbid Glaucoma, Anemia, Cirrhosis , Type II Diabetes, Date Acquired: 05/21/2022 History: Confinement Anxiety Weeks Of Treatment: 0 Clustered Wound: Yes Photos [place], Aston ([id number]) [id number] Page 12 of 13 Wound Measurements Length: (cm) 3.3 Width: (cm) 5 Depth: (cm) 0.1 Clustered Quantity: 3 Area: (cm) 12.959 Volume: (cm) 1.296 % Reduction in Area: % Reduction in Volume: Epithelialization: None Tunneling: No Undermining: No Wound Description Classification: Full Thickness Without Exposed Supp Exudate Amount: Medium Exudate Type: Serosanguineous Exudate Color: red, brown ort Structures Foul Odor After Cleansing: No Slough/Fibrino Yes Wound Bed Granulation Amount: Large (67-100%) Exposed Structure Granulation Quality: Red Fascia Exposed: No Necrotic Amount: Small (1-33%) Fat Layer (Subcutaneous Tissue) Exposed: Yes Necrotic Quality: Adherent Slough Tendon Exposed: No Muscle Exposed: No Joint Exposed: No Bone Exposed:  No Periwound Skin Texture Texture Color No Abnormalities Noted: No No Abnormalities Noted: No Moisture No Abnormalities Noted: No Electronic Signature(s) Signed: 07/26/2022 5:08:37 PM By: Redmond Pulling RN, BSN Entered By: Redmond Pulling on 07/26/2022 13:34:03 -------------------------------------------------------------------------------- Vitals Details Patient Name: Date of Service: [name] NNA 07/26/2022 12:45 PM Medical Record Number: [id number] Patient Account Number: [id number] Date of Birth/Sex: Treating RN: 1985/08/27 (36 y.o. Patricia Lutz Primary Care Quintana Canelo: Neta Mends, MA RIA Other Clinician: Referring Virgie Chery: Treating Florene Brill/Extender: Lockie Mola in Treatment: 0 Vital Signs Time Taken: 13:03 Temperature (F): 98.4 Height (in): 61 Pulse (bpm): 89 Source: Stated Respiratory Rate (breaths/min): 18 Weight (lbs): 179 Blood Pressure (mmHg): 134/90 Source: Stated Capillary Blood Glucose (mg/dl): 56 Body Mass Index (BMI): 33.8 Reference Range: 80 - 120 mg / dl Reedley, Daviana ([id number]) [id number] Page 13 of 13 Electronic Signature(s) Signed: 07/26/2022 5:08:37 PM By: Redmond Pulling RN, BSN Entered By: Redmond Pulling on 07/26/2022 13:04:46

## 2022-08-09 ENCOUNTER — Encounter (HOSPITAL_BASED_OUTPATIENT_CLINIC_OR_DEPARTMENT_OTHER): Payer: 59 | Admitting: Internal Medicine

## 2022-08-13 ENCOUNTER — Encounter (HOSPITAL_BASED_OUTPATIENT_CLINIC_OR_DEPARTMENT_OTHER): Payer: PRIVATE HEALTH INSURANCE | Admitting: Internal Medicine

## 2022-08-16 ENCOUNTER — Observation Stay (HOSPITAL_COMMUNITY)
Admission: EM | Admit: 2022-08-16 | Discharge: 2022-08-18 | Disposition: A | Discharge status: Home / Self Care | Payer: 59 | Attending: Family Medicine | Admitting: Family Medicine

## 2022-08-16 ENCOUNTER — Emergency Department (HOSPITAL_COMMUNITY): Payer: 59

## 2022-08-16 ENCOUNTER — Other Ambulatory Visit: Payer: Self-pay

## 2022-08-16 DIAGNOSIS — Z79899 Other long term (current) drug therapy: Secondary | ICD-10-CM | POA: Diagnosis not present

## 2022-08-16 DIAGNOSIS — Z794 Long term (current) use of insulin: Secondary | ICD-10-CM | POA: Diagnosis not present

## 2022-08-16 DIAGNOSIS — Z978 Presence of other specified devices: Secondary | ICD-10-CM

## 2022-08-16 DIAGNOSIS — R109 Unspecified abdominal pain: Secondary | ICD-10-CM | POA: Diagnosis present

## 2022-08-16 DIAGNOSIS — E039 Hypothyroidism, unspecified: Secondary | ICD-10-CM | POA: Diagnosis not present

## 2022-08-16 DIAGNOSIS — R112 Nausea with vomiting, unspecified: Secondary | ICD-10-CM | POA: Diagnosis not present

## 2022-08-16 DIAGNOSIS — Z87898 Personal history of other specified conditions: Secondary | ICD-10-CM

## 2022-08-16 DIAGNOSIS — U071 COVID-19: Secondary | ICD-10-CM | POA: Diagnosis not present

## 2022-08-16 DIAGNOSIS — E2740 Unspecified adrenocortical insufficiency: Secondary | ICD-10-CM | POA: Diagnosis not present

## 2022-08-16 DIAGNOSIS — Z7982 Long term (current) use of aspirin: Secondary | ICD-10-CM | POA: Diagnosis not present

## 2022-08-16 DIAGNOSIS — Z944 Liver transplant status: Secondary | ICD-10-CM

## 2022-08-16 DIAGNOSIS — E271 Primary adrenocortical insufficiency: Secondary | ICD-10-CM | POA: Diagnosis present

## 2022-08-16 DIAGNOSIS — F1021 Alcohol dependence, in remission: Secondary | ICD-10-CM

## 2022-08-16 DIAGNOSIS — E860 Dehydration: Secondary | ICD-10-CM

## 2022-08-16 LAB — CBC WITH DIFFERENTIAL/PLATELET
Abs Immature Granulocytes: 0.02 10*3/uL (ref 0.00–0.07)
Basophils Absolute: 0 10*3/uL (ref 0.0–0.1)
Basophils Relative: 0 %
Eosinophils Absolute: 0 10*3/uL (ref 0.0–0.5)
Eosinophils Relative: 0 %
HCT: 39.4 % (ref 36.0–46.0)
Hemoglobin: 12.1 g/dL (ref 12.0–15.0)
Immature Granulocytes: 0 %
Lymphocytes Relative: 5 %
Lymphs Abs: 0.4 10*3/uL — ABNORMAL LOW (ref 0.7–4.0)
MCH: 25.3 pg — ABNORMAL LOW (ref 26.0–34.0)
MCHC: 30.7 g/dL (ref 30.0–36.0)
MCV: 82.4 fL (ref 80.0–100.0)
Monocytes Absolute: 1.1 10*3/uL — ABNORMAL HIGH (ref 0.1–1.0)
Monocytes Relative: 16 %
Neutro Abs: 5.6 10*3/uL (ref 1.7–7.7)
Neutrophils Relative %: 79 %
Platelets: 165 10*3/uL (ref 150–400)
RBC: 4.78 MIL/uL (ref 3.87–5.11)
RDW: 16.9 % — ABNORMAL HIGH (ref 11.5–15.5)
WBC: 7.1 10*3/uL (ref 4.0–10.5)
nRBC: 0 % (ref 0.0–0.2)

## 2022-08-16 LAB — COMPREHENSIVE METABOLIC PANEL
ALT: 29 U/L (ref 0–44)
AST: 37 U/L (ref 15–41)
Albumin: 3.8 g/dL (ref 3.5–5.0)
Alkaline Phosphatase: 63 U/L (ref 38–126)
Anion gap: 11 (ref 5–15)
BUN: 20 mg/dL (ref 6–20)
CO2: 24 mmol/L (ref 22–32)
Calcium: 9.1 mg/dL (ref 8.9–10.3)
Chloride: 98 mmol/L (ref 98–111)
Creatinine, Ser: 1.4 mg/dL — ABNORMAL HIGH (ref 0.44–1.00)
GFR, Estimated: 50 mL/min — ABNORMAL LOW (ref >60)
Glucose, Bld: 113 mg/dL — ABNORMAL HIGH (ref 70–99)
Potassium: 4.3 mmol/L (ref 3.5–5.1)
Sodium: 133 mmol/L — ABNORMAL LOW (ref 135–145)
Total Bilirubin: 1 mg/dL (ref 0.3–1.2)
Total Protein: 6.8 g/dL (ref 6.5–8.1)

## 2022-08-16 LAB — LACTIC ACID, PLASMA
Lactic Acid, Venous: 1.4 mmol/L (ref 0.5–1.9)
Lactic Acid, Venous: 1 mmol/L (ref 0.5–1.9)

## 2022-08-16 LAB — SARS CORONAVIRUS 2 BY RT PCR: SARS Coronavirus 2 by RT PCR: POSITIVE — AB

## 2022-08-16 LAB — LIPASE, BLOOD: Lipase: 30 U/L (ref 11–51)

## 2022-08-16 LAB — I-STAT BETA HCG BLOOD, ED (MC, WL, AP ONLY): I-stat hCG, quantitative: <5 m[IU]/mL (ref <5)

## 2022-08-16 MED ORDER — KETOROLAC TROMETHAMINE 15 MG/ML IJ SOLN
15.0000 mg | Freq: Once | INTRAMUSCULAR | Status: AC
  Administered 2022-08-16: 15 mg via INTRAVENOUS
  Filled 2022-08-16: qty 1

## 2022-08-16 MED ORDER — IOHEXOL 350 MG/ML SOLN
65.0000 mL | Freq: Once | INTRAVENOUS | Status: AC | PRN
  Administered 2022-08-16: 65 mL via INTRAVENOUS

## 2022-08-16 MED ORDER — ONDANSETRON HCL 4 MG/2ML IJ SOLN
INTRAMUSCULAR | Status: AC
  Administered 2022-08-16: 4 mg via INTRAVENOUS
  Filled 2022-08-16: qty 2

## 2022-08-16 MED ORDER — ACETAMINOPHEN 325 MG PO TABS
650.0000 mg | ORAL_TABLET | Freq: Once | ORAL | Status: AC
  Administered 2022-08-16: 650 mg via ORAL
  Filled 2022-08-16: qty 2

## 2022-08-16 MED ORDER — LACTATED RINGERS IV BOLUS
1000.0000 mL | Freq: Once | INTRAVENOUS | Status: AC
  Administered 2022-08-16: 1000 mL via INTRAVENOUS

## 2022-08-16 MED ORDER — ONDANSETRON HCL 4 MG/2ML IJ SOLN: 4.0000 mg | Freq: Once | INTRAMUSCULAR | Status: AC

## 2022-08-16 MED ORDER — LACTATED RINGERS IV BOLUS
1000.0000 mL | Freq: Once | INTRAVENOUS | Status: AC
  Administered 2022-08-17: 1000 mL via INTRAVENOUS

## 2022-08-16 NOTE — ED Triage Notes (Signed)
PT arrived to ED via POV, d/t N/V and fever, s/s began around 0200 this morning, fever has been increasing along with pain in RLQ until 1200 when she states unable to tolerate pain, rated 9/10. PT reports taking 1000 mg of Tylenol around noon with minimal relief. Unable to tolerate food, takes minimal liquids. Hx of liver transplant and indwelling Foley catheter in place appears diaphoretic in triage.

## 2022-08-16 NOTE — ED Provider Notes (Signed)
11:52 PM Assumed care from Dr. Tyrone Nine, please see their note for full history, physical and decision making until this point. In brief this is a 37 y.o. year old female who presented to the ED tonight with Abdominal Pain and Fever     Covid. Weak. Not eating. Dehydrated, soft pressures. Plan for admit when pressure improve.   Pressures improved. D/w Dr. Alcario Drought will admit for observation.   Labs, studies and imaging reviewed by myself and considered in medical decision making if ordered. Imaging interpreted by radiology.  Labs Reviewed  SARS CORONAVIRUS 2 BY RT PCR - Abnormal; Notable for the following components:      Result Value   SARS Coronavirus 2 by RT PCR POSITIVE (*)    All other components within normal limits  COMPREHENSIVE METABOLIC PANEL - Abnormal; Notable for the following components:   Sodium 133 (*)    Glucose, Bld 113 (*)    Creatinine, Ser 1.40 (*)    GFR, Estimated 50 (*)    All other components within normal limits  CBC WITH DIFFERENTIAL/PLATELET - Abnormal; Notable for the following components:   MCH 25.3 (*)    RDW 16.9 (*)    Lymphs Abs 0.4 (*)    Monocytes Absolute 1.1 (*)    All other components within normal limits  CULTURE, BLOOD (ROUTINE X 2)  CULTURE, BLOOD (ROUTINE X 2)  LACTIC ACID, PLASMA  LACTIC ACID, PLASMA  LIPASE, BLOOD  URINALYSIS, ROUTINE W REFLEX MICROSCOPIC  I-STAT BETA HCG BLOOD, ED ([facility], [facility], [facility] ONLY)    CT ABDOMEN PELVIS W CONTRAST  Final Result    DG Chest Portable 1 View  Final Result      No follow-ups on file.    Merrily Pew, MD 08/17/22 [phone]

## 2022-08-16 NOTE — ED Provider Notes (Signed)
[facility] Provider Note   CSN: [id number] Arrival date & time: 08/16/22  1640     History  Chief Complaint  Patient presents with   Abdominal Pain   Fever    Patricia Lutz is a 37 y.o. female with a past medical history significant for anxiety, depression, hypothyroidism, history of PUD, liver transplant roughly 3 years ago who presents to the ED due to right-sided abdominal pain associated with fever and nausea/vomiting.  Patient admits to 1 episode of nonbloody, nonbilious emesis earlier today.  She notes her fever reached 105 F earlier today.  She endorses right-sided abdominal pain.  She has a chronic indwelling Foley catheter due to chronic urinary retention which is currently being worked up.  Patient last had her Foley changed 3 days ago, waiting urine culture results.  Patient notes to frequent UTIs.  Denies any dysuria or symptoms of UTI.  Patient notes her symptoms started with a sore throat and cough.  Son sick with similar symptoms.  Denies diarrhea.  Admits to a severe headache.  No neck stiffness.  Currently on chronic steroids and anti-rejection medications.   History obtained from patient and past medical records. No interpreter used during encounter.       Home Medications Prior to Admission medications   Medication Sig Start Date End Date Taking? Authorizing Provider  alprazolam Duanne Moron) 2 MG tablet Take 2 mg by mouth 3 (three) times daily. 12/14/20   [provider]  ASPIRIN LOW DOSE 81 MG EC tablet Take 81 mg by mouth daily. 12/31/20   [provider]  buprenorphine (BUTRANS) 20 MCG/HR PTWK Place 1 patch onto the skin once a week. 01/25/22   [provider]  Calcium Citrate-Vitamin D3 315-6.25 MG-MCG TABS Take 2 tablets by mouth in the morning and at bedtime. 09/28/19   [provider]  clonazePAM (KLONOPIN) 2 MG tablet Take 1 tablet (2 mg total) by mouth at bedtime. Patient taking  differently: Take 2 mg by mouth at bedtime as needed for anxiety. 08/10/20 01/07/21  Pucilowski, Marchia Bond, MD  cycloSPORINE modified (NEORAL) 25 MG capsule Take 50 mg by mouth in the morning and at bedtime. 12/16/20   [provider]  dicyclomine (BENTYL) 20 MG tablet Take 1 tablet (20 mg total) by mouth 2 (two) times daily. Patient not taking: Reported on 02/12/2022 08/22/21   Dion Saucier A, PA  famotidine (PEPCID) 40 MG tablet Take 40 mg by mouth at bedtime.    [provider]  hydrocortisone (CORTEF) 5 MG tablet Take 10-15 mg by mouth See admin instructions. Take 15 mg by mouth in the morning and then take 10 mg by mouth in the evening per patient 11/17/21   [provider]  insulin lispro (HUMALOG) 100 UNIT/ML KwikPen Inject 5 Units into the skin 3 (three) times daily as needed (high blood sugar). Take 1 unit if BS>150 01/01/21   [provider]  lansoprazole (PREVACID) 15 MG capsule Take 30 mg by mouth 2 (two) times daily before a meal.    [provider]  levothyroxine (SYNTHROID) 137 MCG tablet Take 137 mcg by mouth daily before breakfast.    [provider]  magnesium oxide (MAG-OX) 400 MG tablet Take 2 tablets by mouth 2 (two) times daily. 11/01/20   [provider]  mycophenolate (MYFORTIC) 180 MG EC tablet Take 180 mg by mouth 2 (two) times daily. 10/27/20   [provider]  norethindrone (AYGESTIN) 5 MG  tablet Take 1 tablet by mouth daily. 11/03/21   [provider]  ondansetron (ZOFRAN-ODT) 4 MG disintegrating tablet Take 4 mg by mouth every 8 (eight) hours as needed for nausea or vomiting.  04/30/19   [provider]  pantoprazole (PROTONIX) 40 MG tablet Take 1 tablet (40 mg total) by mouth 2 (two) times daily. Patient not taking: Reported on 01/07/2021 05/24/16   Jola Schmidt, MD  promethazine (PHENERGAN) 25 MG tablet Take 25 mg by mouth every 8 (eight) hours as needed for nausea or vomiting. 01/25/22    [provider]  VEMLIDY 25 MG TABS Take 1 tablet by mouth daily. 11/30/20   [provider]  zolpidem (AMBIEN) 10 MG tablet Take 10 mg by mouth at bedtime. 01/01/21   [provider]      Allergies    Ibuprofen    Review of Systems   Review of Systems  Constitutional:  Positive for chills and fever.  Respiratory:  Positive for shortness of breath.   Cardiovascular:  Negative for chest pain and leg swelling.  Gastrointestinal:  Positive for abdominal pain, nausea and vomiting. Negative for diarrhea.  Genitourinary:  Negative for dysuria.    Physical Exam Updated Vital Signs BP (!) 91/50 (BP Location: Left Arm)   Pulse (!) 115   Temp (!) 101.1 F (38.4 C) (Oral)   Resp (!) 22   Ht 5\' 2"  (1.575 m)   Wt 81.2 kg   SpO2 94%   BMI 32.74 kg/m  Physical Exam Vitals and nursing note reviewed.  Constitutional:      General: She is not in acute distress.    Appearance: She is not ill-appearing.  HENT:     Head: Normocephalic.  Eyes:     Pupils: Pupils are equal, round, and reactive to light.  Cardiovascular:     Rate and Rhythm: Regular rhythm. Tachycardia present.     Pulses: Normal pulses.     Heart sounds: Normal heart sounds. No murmur heard.    No friction rub. No gallop.  Pulmonary:     Effort: Pulmonary effort is normal.     Breath sounds: Normal breath sounds.  Abdominal:     General: Abdomen is flat. There is no distension.     Palpations: Abdomen is soft.     Tenderness: There is abdominal tenderness. There is no guarding or rebound.     Comments: RUQ and RLQ tenderness  Musculoskeletal:        General: Normal range of motion.     Cervical back: Neck supple.  Skin:    General: Skin is warm and dry.  Neurological:     General: No focal deficit present.     Mental Status: She is alert.  Psychiatric:        Mood and Affect: Mood normal.        Behavior: Behavior normal.     ED Results / Procedures / Treatments   Labs (all labs  ordered are listed, but only abnormal results are displayed) Labs Reviewed  SARS CORONAVIRUS 2 BY RT PCR - Abnormal; Notable for the following components:      Result Value   SARS Coronavirus 2 by RT PCR POSITIVE (*)    All other components within normal limits  COMPREHENSIVE METABOLIC PANEL - Abnormal; Notable for the following components:   Sodium 133 (*)    Glucose, Bld 113 (*)    Creatinine, Ser 1.40 (*)    GFR, Estimated 50 (*)  All other components within normal limits  CBC WITH DIFFERENTIAL/PLATELET - Abnormal; Notable for the following components:   MCH 25.3 (*)    RDW 16.9 (*)    Lymphs Abs 0.4 (*)    Monocytes Absolute 1.1 (*)    All other components within normal limits  CULTURE, BLOOD (ROUTINE X 2)  CULTURE, BLOOD (ROUTINE X 2)  LACTIC ACID, PLASMA  LACTIC ACID, PLASMA  LIPASE, BLOOD  URINALYSIS, ROUTINE W REFLEX MICROSCOPIC  I-STAT BETA HCG BLOOD, ED ([facility], [facility], [facility] ONLY)    EKG EKG Interpretation  Date/Time:  Thursday August 16 2022 18:57:40 EDT Ventricular Rate:  120 PR Interval:  115 QRS Duration: 87 QT Interval:  302 QTC Calculation: 427 R Axis:   68 Text Interpretation: Sinus tachycardia Since last tracing rate faster Otherwise no significant change Confirmed by Deno Etienne [phone]) on 08/16/2022 7:17:03 PM  Radiology CT ABDOMEN PELVIS W CONTRAST  Result Date: 08/16/2022 CLINICAL DATA:  Acute abdominal pain EXAM: CT ABDOMEN AND PELVIS WITH CONTRAST TECHNIQUE: Multidetector CT imaging of the abdomen and pelvis was performed using the standard protocol following bolus administration of intravenous contrast. RADIATION DOSE REDUCTION: This exam was performed according to the departmental dose-optimization program which includes automated exposure control, adjustment of the mA and/or kV according to patient size and/or use of iterative reconstruction technique. CONTRAST:  109mL OMNIPAQUE IOHEXOL 350 MG/ML SOLN COMPARISON:  02/12/2022 FINDINGS: Lower chest: No acute  abnormality. Hepatobiliary: Fatty infiltration of the liver is noted. The gallbladder has been surgically removed. Changes consistent with the known history of liver transplant are seen. Pancreas: Unremarkable. No pancreatic ductal dilatation or surrounding inflammatory changes. Spleen: Normal in size without focal abnormality. Adrenals/Urinary Tract: Adrenal glands are within normal limits. Kidneys demonstrate a normal enhancement pattern bilaterally. No renal calculi or obstructive changes are seen. The bladder is decompressed by Foley catheter. Stomach/Bowel: No obstructive or inflammatory changes of colon are seen. The appendix is not visualized. No inflammatory changes to suggest appendicitis are noted. Small bowel and stomach are within normal limits. Vascular/Lymphatic: No significant vascular findings are present. No enlarged abdominal or pelvic lymph nodes. Reproductive: IUD is noted in place.  No adnexal mass is noted. Other: No abdominal wall hernia or abnormality. No abdominopelvic ascites. Musculoskeletal: No acute or significant osseous findings. IMPRESSION: Changes consistent with prior hepatic transplant. No acute abnormality noted. Electronically Signed   By: Inez Catalina M.D.   On: 08/16/2022 21:30   DG Chest Portable 1 View  Result Date: 08/16/2022 CLINICAL DATA:  Shortness of breath EXAM: PORTABLE CHEST 1 VIEW COMPARISON:  11/12/2020 FINDINGS: The heart size and mediastinal contours are within normal limits. Both lungs are clear. The visualized skeletal structures are unremarkable. IMPRESSION: No active disease. Electronically Signed   By: Donavan Foil M.D.   On: 08/16/2022 19:21    Procedures Procedures    Medications Ordered in ED Medications  lactated ringers bolus 1,000 mL (has no administration in time range)  acetaminophen (TYLENOL) tablet 650 mg (has no administration in time range)  lactated ringers bolus 1,000 mL (1,000 mLs Intravenous New Bag/Given 08/16/22 2036)   ketorolac (TORADOL) 15 MG/ML injection 15 mg (15 mg Intravenous Given 08/16/22 2034)  ondansetron (ZOFRAN) injection 4 mg (4 mg Intravenous Given 08/16/22 2034)  iohexol (OMNIPAQUE) 350 MG/ML injection 65 mL (65 mLs Intravenous Contrast Given 08/16/22 2126)    ED Course/ Medical Decision Making/ A&P Clinical Course as of 08/16/22 2225  Thu Aug 16, 2022  2018 WBC: 7.1 [CA]  2018 Lactic Acid, Venous: 1.4 [CA]    Clinical Course User Index [CA] Suzy Bouchard, PA-C                             Medical Decision Making Amount and/or Complexity of Data Reviewed External Data Reviewed: notes. Labs: ordered. Decision-making details documented in ED Course. Radiology: ordered and independent interpretation performed. Decision-making details documented in ED Course. ECG/medicine tests: ordered and independent interpretation performed. Decision-making details documented in ED Course.  Risk OTC drugs. Prescription drug management.   This patient presents to the ED for concern of fever, abdominal pain, this involves an extensive number of treatment options, and is a complaint that carries with it a high risk of complications and morbidity.  The differential diagnosis includes viral process, liver infection, bowel obstruction, appendicitis, etc  37 year old female presents to the ED due to fever, nausea, vomiting, and right-sided abdominal pain.  Patient's son sick with cough and sore throat.  Patient has a history of liver transplant followed by [facility].  Upon arrival, patient brought 100.4 F and tachycardic 142.  Patient nontoxic-appearing.  Physical exam significant for right-sided abdominal tenderness without rebound or guarding.  Lungs clear to auscultation bilaterally.  No meningismus to suggest meningitis.  Sepsis screening labs ordered.  CT abdomen to rule out evidence of abnormalities in the liver versus appendicitis versus other etiologies of abdominal pain.  COVID to rule out infection.   IV fluids started.  Will give Toradol for fever given patient just took Tylenol prior to arrival.  CBC reassuring.  No leukocytosis.  Normal hemoglobin.  Lactic acid normal at 1 Low suspicion for sepsis.  CMP significant for natremia 133.  Creatinine elevated 1.4.  Normal liver enzymes.  No anion gap.  Pregnancy test negative.  Doubt ectopic pregnancy.  Lipase normal.  Low suspicion for pancreatitis.  Chest x-ray personally reviewed and interpreted which are negative for signs of pneumonia, pneumothorax, widened mediastinum.  EKG demonstrates sinus tachycardia.  No signs of acute ischemia.  Tachycardia likely secondary to fever. No chest pain, low suspicion for cardiac etiology. Low suspicion for PE.  CT abdomen personally reviewed and interpreted which demonstrates changes consistent with prior hepatic transplant.  No other acute abnormalities found.  9:58 PM reassessed patient at bedside.  Patient admits to worsening of headache.  CT scan negative.  COVID-positive which is likely causing patient's symptoms.  Patient BP is downtrending, patient's arm was bent so was not receiving IVFs. IV fixed, fluids running. Concern for patient's immunocompromised state and COVID infection. Tylenol given for persistent fever.  Patient handed off to Dr Tyrone Nine pending IVFs and reassessment. If patient does not feel improved, she will require admission for COVID due to immunocompromised state.         Final Clinical Impression(s) / ED Diagnoses Final diagnoses:  COVID-19 virus infection    Rx / DC Orders ED Discharge Orders     None         Karie Kirks 08/16/22 Pajaros, [place], DO 08/16/22 2357

## 2022-08-17 ENCOUNTER — Encounter (HOSPITAL_COMMUNITY): Payer: Self-pay | Admitting: Internal Medicine

## 2022-08-17 DIAGNOSIS — U071 COVID-19: Principal | ICD-10-CM

## 2022-08-17 DIAGNOSIS — E271 Primary adrenocortical insufficiency: Secondary | ICD-10-CM | POA: Diagnosis not present

## 2022-08-17 DIAGNOSIS — F1021 Alcohol dependence, in remission: Secondary | ICD-10-CM

## 2022-08-17 DIAGNOSIS — Z944 Liver transplant status: Secondary | ICD-10-CM

## 2022-08-17 DIAGNOSIS — Z978 Presence of other specified devices: Secondary | ICD-10-CM

## 2022-08-17 DIAGNOSIS — Z87898 Personal history of other specified conditions: Secondary | ICD-10-CM

## 2022-08-17 LAB — URINALYSIS, ROUTINE W REFLEX MICROSCOPIC
Bilirubin Urine: NEGATIVE
Glucose, UA: NEGATIVE mg/dL
Ketones, ur: NEGATIVE mg/dL
Leukocytes,Ua: NEGATIVE
Nitrite: POSITIVE — AB
Protein, ur: NEGATIVE mg/dL
Specific Gravity, Urine: 1.025 (ref 1.005–1.030)
pH: 6 (ref 5.0–8.0)

## 2022-08-17 LAB — COMPREHENSIVE METABOLIC PANEL
ALT: 25 U/L (ref 0–44)
AST: 39 U/L (ref 15–41)
Albumin: 2.9 g/dL — ABNORMAL LOW (ref 3.5–5.0)
Alkaline Phosphatase: 51 U/L (ref 38–126)
Anion gap: 8 (ref 5–15)
BUN: 23 mg/dL — ABNORMAL HIGH (ref 6–20)
CO2: 25 mmol/L (ref 22–32)
Calcium: 8.1 mg/dL — ABNORMAL LOW (ref 8.9–10.3)
Chloride: 102 mmol/L (ref 98–111)
Creatinine, Ser: 1.33 mg/dL — ABNORMAL HIGH (ref 0.44–1.00)
GFR, Estimated: 53 mL/min — ABNORMAL LOW (ref >60)
Glucose, Bld: 104 mg/dL — ABNORMAL HIGH (ref 70–99)
Potassium: 5.1 mmol/L (ref 3.5–5.1)
Sodium: 135 mmol/L (ref 135–145)
Total Bilirubin: 1 mg/dL (ref 0.3–1.2)
Total Protein: 5.2 g/dL — ABNORMAL LOW (ref 6.5–8.1)

## 2022-08-17 LAB — CBC
HCT: 32.3 % — ABNORMAL LOW (ref 36.0–46.0)
Hemoglobin: 10.2 g/dL — ABNORMAL LOW (ref 12.0–15.0)
MCH: 26 pg (ref 26.0–34.0)
MCHC: 31.6 g/dL (ref 30.0–36.0)
MCV: 82.2 fL (ref 80.0–100.0)
Platelets: 118 10*3/uL — ABNORMAL LOW (ref 150–400)
RBC: 3.93 MIL/uL (ref 3.87–5.11)
RDW: 16.8 % — ABNORMAL HIGH (ref 11.5–15.5)
WBC: 5 10*3/uL (ref 4.0–10.5)
nRBC: 0 % (ref 0.0–0.2)

## 2022-08-17 LAB — GLUCOSE, CAPILLARY: Glucose-Capillary: 106 mg/dL — ABNORMAL HIGH (ref 70–99)

## 2022-08-17 LAB — HIV ANTIBODY (ROUTINE TESTING W REFLEX): HIV Screen 4th Generation wRfx: NONREACTIVE

## 2022-08-17 LAB — PROCALCITONIN: Procalcitonin: 0.31 ng/mL

## 2022-08-17 MED ORDER — TENOFOVIR ALAFENAMIDE FUMARATE 25 MG PO TABS
1.0000 | ORAL_TABLET | Freq: Every day | ORAL | Status: DC
  Administered 2022-08-17 – 2022-08-18 (×2): 25 mg via ORAL
  Filled 2022-08-17 (×2): qty 1

## 2022-08-17 MED ORDER — CHLORHEXIDINE GLUCONATE CLOTH 2 % EX PADS
6.0000 | MEDICATED_PAD | Freq: Every day | CUTANEOUS | Status: DC
  Administered 2022-08-17: 6 via TOPICAL

## 2022-08-17 MED ORDER — ONDANSETRON HCL 4 MG/2ML IJ SOLN: 4.0000 mg | Freq: Four times a day (QID) | INTRAMUSCULAR | Status: DC | PRN

## 2022-08-17 MED ORDER — MYCOPHENOLATE SODIUM 180 MG PO TBEC: 180.0000 mg | DELAYED_RELEASE_TABLET | Freq: Two times a day (BID) | ORAL | Status: DC

## 2022-08-17 MED ORDER — ACETAMINOPHEN 325 MG PO TABS: 650.0000 mg | ORAL_TABLET | Freq: Four times a day (QID) | ORAL | Status: DC | PRN

## 2022-08-17 MED ORDER — INSULIN ASPART 100 UNIT/ML IJ SOLN: 0.0000 [IU] | Freq: Every day | INTRAMUSCULAR | Status: DC

## 2022-08-17 MED ORDER — BUPRENORPHINE HCL 2 MG SL SUBL: 1.0000 mg | SUBLINGUAL_TABLET | Freq: Two times a day (BID) | SUBLINGUAL | Status: DC

## 2022-08-17 MED ORDER — ONDANSETRON HCL 4 MG PO TABS: 4.0000 mg | ORAL_TABLET | Freq: Four times a day (QID) | ORAL | Status: DC | PRN

## 2022-08-17 MED ORDER — FUROSEMIDE 40 MG PO TABS
40.0000 mg | ORAL_TABLET | Freq: Every day | ORAL | Status: DC
  Administered 2022-08-17 – 2022-08-18 (×2): 40 mg via ORAL
  Filled 2022-08-17: qty 1
  Filled 2022-08-17: qty 2

## 2022-08-17 MED ORDER — LEVOTHYROXINE SODIUM 25 MCG PO TABS
137.0000 ug | ORAL_TABLET | Freq: Every day | ORAL | Status: DC
  Administered 2022-08-17 – 2022-08-18 (×2): 137 ug via ORAL
  Filled 2022-08-17 (×2): qty 1

## 2022-08-17 MED ORDER — ALPRAZOLAM 0.25 MG PO TABS
0.5000 mg | ORAL_TABLET | Freq: Three times a day (TID) | ORAL | Status: DC | PRN
  Administered 2022-08-17: 2 mg via ORAL
  Filled 2022-08-17: qty 8

## 2022-08-17 MED ORDER — MAGNESIUM OXIDE -MG SUPPLEMENT 400 (240 MG) MG PO TABS
800.0000 mg | ORAL_TABLET | Freq: Two times a day (BID) | ORAL | Status: DC
  Administered 2022-08-17 – 2022-08-18 (×3): 800 mg via ORAL
  Filled 2022-08-17 (×3): qty 2

## 2022-08-17 MED ORDER — INSULIN ASPART 100 UNIT/ML IJ SOLN
0.0000 [IU] | Freq: Three times a day (TID) | INTRAMUSCULAR | Status: DC
  Administered 2022-08-18: 2 [IU] via SUBCUTANEOUS

## 2022-08-17 MED ORDER — ACETAMINOPHEN 650 MG RE SUPP: 650.0000 mg | Freq: Four times a day (QID) | RECTAL | Status: DC | PRN

## 2022-08-17 MED ORDER — LACTATED RINGERS IV BOLUS
1000.0000 mL | Freq: Once | INTRAVENOUS | Status: AC
  Administered 2022-08-17: 1000 mL via INTRAVENOUS

## 2022-08-17 MED ORDER — CYCLOSPORINE MODIFIED (NEORAL) 25 MG PO CAPS
50.0000 mg | ORAL_CAPSULE | Freq: Two times a day (BID) | ORAL | Status: DC
  Administered 2022-08-17 – 2022-08-18 (×3): 50 mg via ORAL
  Filled 2022-08-17 (×4): qty 2

## 2022-08-17 MED ORDER — ENOXAPARIN SODIUM 40 MG/0.4ML IJ SOSY
40.0000 mg | PREFILLED_SYRINGE | INTRAMUSCULAR | Status: DC
  Administered 2022-08-17 – 2022-08-18 (×2): 40 mg via SUBCUTANEOUS
  Filled 2022-08-17 (×2): qty 0.4

## 2022-08-17 MED ORDER — MOLNUPIRAVIR EUA 200MG CAPSULE
4.0000 | ORAL_CAPSULE | Freq: Two times a day (BID) | ORAL | Status: DC
  Administered 2022-08-17 – 2022-08-18 (×3): 800 mg via ORAL
  Filled 2022-08-17: qty 4

## 2022-08-17 MED ORDER — HYDROCORTISONE SOD SUC (PF) 100 MG IJ SOLR
100.0000 mg | Freq: Two times a day (BID) | INTRAMUSCULAR | Status: DC
  Administered 2022-08-17 – 2022-08-18 (×3): 100 mg via INTRAVENOUS
  Filled 2022-08-17 (×3): qty 2

## 2022-08-17 MED ORDER — PANTOPRAZOLE SODIUM 40 MG IV SOLR
40.0000 mg | Freq: Two times a day (BID) | INTRAVENOUS | Status: DC
  Administered 2022-08-17 – 2022-08-18 (×3): 40 mg via INTRAVENOUS
  Filled 2022-08-17 (×3): qty 10

## 2022-08-17 NOTE — Progress Notes (Signed)
Subjective: Patient admitted this morning, see detailed H&P by Dr. Alcario Drought  37 year old female with history of prior EtOH abuse, EtOH cirrhosis, s/p liver transplant, Addison's disease came to ED with complaints of fever, right-sided abdominal pain, nausea vomiting.  Also had fever with cough and sore throat.  She has chronic urine retention and uses chronic Foley catheter.  No symptoms of dysuria or UTI. Patient was found to be positive for COVID-19.  Started on molnupiravir  Vitals:   08/17/22 0638 08/17/22 0700  BP:  110/76  Pulse: 64 62  Resp: 20 19  Temp: 99.8 F (37.7 C)   SpO2: 96% 98%      A/P  COVID-19 infection -Not requiring oxygen -Started on molnupiravir -Admitted for soft BP in the ED, immunosuppressed state  Addison's disease/adrenal insufficiency -Patient has longstanding history of adrenal insufficiency -Patient on Cortef chronically -Has persistently soft BPs in the ED  Liver transplant status -S/p liver transplant for alcoholic cirrhosis -Takes cyclosporine 50 mg twice daily -Patient no longer on mycophenolate  Chronic indwelling Foley catheter -UA unremarkable -Urine culture pending  History of peptic ulcer disease -Started on PPI  Alcohol use disorder -Currently in remission -No signs symptoms of alcohol withdrawal   [facility]

## 2022-08-17 NOTE — ED Notes (Signed)
Pt catheter bag emptied, 900 cc's of urine out of bag. Pt resting, rise/fall of chest noted. No acute distress noted. Call bell in reach.

## 2022-08-17 NOTE — Assessment & Plan Note (Addendum)
Pt with longstanding adrenal insufficiency. On cortef chronically. BPs persistently soft in ED despite IVF. Gave empiric stress dose steroids in ED. BP now much improved it appears following stress dose steroids. Continue stress dose solucortef for the moment. SSI mod scale AC/HS

## 2022-08-17 NOTE — ED Notes (Signed)
Per pt request, used Dexacom versus finger stick, glucose 106.

## 2022-08-17 NOTE — ED Notes (Signed)
Patient awake and alert, no s/s of distress, will continue to monitor.  

## 2022-08-17 NOTE — Assessment & Plan Note (Signed)
PPI empirically with the stress dose steroids.

## 2022-08-17 NOTE — Progress Notes (Signed)
Pharmacy Antibiotic Note  Patricia Lutz is a 37 y.o. female admitted on 08/16/2022 with right-sided abdominal pain associated with fever and nausea/vomiting .  Pharmacy has been consulted for molnupiravir dosing. Patient with liver transplant roughly 3 years ago and on multiple immune suppression medications that interact with Paxlovid. After discussion with provider he feels molnupiravir to be the most appropriate option at this time.   Plan: Molnupiravir 800mg  PO BID x5 days  Height: 5\' 2"  (157.5 cm) Weight: 81.2 kg (179 lb) IBW/kg (Calculated) : 50.1  Temp (24hrs), Avg:101 F (38.3 C), Min:99.9 F (37.7 C), Max:102.5 F (39.2 C)  Recent Labs  Lab 08/16/22 1722 08/16/22 2030  WBC 7.1  --   CREATININE 1.40*  --   LATICACIDVEN 1.4 1.0    Estimated Creatinine Clearance: 54.3 mL/min (A) (by C-G formula based on SCr of 1.4 mg/dL (H)).    Allergies  Allergen Reactions   Ibuprofen Nausea And Vomiting and Other (See Comments)    Thank you for allowing pharmacy to be a part of this patient's care.  Jodean Lima Tennie Grussing 08/17/2022 4:52 AM

## 2022-08-17 NOTE — Assessment & Plan Note (Addendum)
Long term remission. Does look like she uses very high doses / frequency of xanax however.  Up to 2mg  TID per script.  Sounds like she typically takes 2mg  total daily dose in divided doses per pal notes. Will order PRN xanax TID (dont want her to go into withdrawal).

## 2022-08-17 NOTE — ED Notes (Signed)
ED TO INPATIENT HANDOFF REPORT  ED Nurse Name and Phone #: [place] [facility] Name/Age/Gender Patricia Lutz 37 y.o. female Room/Bed: 010C/010C  Code Status   Code Status: Full Code  Home/SNF/Other Home Patient oriented to: self, place, time, and situation Is this baseline? Yes   Triage Complete: Triage complete  Chief Complaint COVID-19 [U07.1]  Triage Note PT arrived to ED via POV, d/t N/V and fever, s/s began around 0200 this morning, fever has been increasing along with pain in RLQ until 1200 when she states unable to tolerate pain, rated 9/10. PT reports taking 1000 mg of Tylenol around noon with minimal relief. Unable to tolerate food, takes minimal liquids. Hx of liver transplant and indwelling Foley catheter in place appears diaphoretic in triage.    Allergies Allergies  Allergen Reactions   Ibuprofen Nausea And Vomiting and Other (See Comments)    Level of Care/Admitting Diagnosis ED Disposition     ED Disposition  Admit   Condition  --   [place]: [place] [[id number]]  Level of Care: Med-Surg [16]  May place patient in observation at [facility] or [place] if equivalent level of care is available:: No  Covid Evaluation: Confirmed COVID Positive  Diagnosis: COVID-19 [id number]  Admitting Physician: Etta Quill [id number]  Attending Physician: Etta Quill [[id number]]          B Medical/Surgery History Past Medical History:  Diagnosis Date   Anxiety    Cirrhosis ([place])    Depression    Diabetes mellitus without complication ([place])    GERD (gastroesophageal reflux disease)    Hashimoto's disease    History of stomach ulcers    Hypothyroidism    Substance abuse ([place])    alcohol   Past Surgical History:  Procedure Laterality Date   CESAREAN SECTION     LIVER TRANSPLANT       A IV Location/Drains/Wounds Patient Lines/Drains/Airways Status     Active Line/Drains/Airways     Name Placement date Placement time  Site Days   Peripheral IV 08/17/22 18 G 2.5" Anterior;Left;Proximal;Upper Arm 08/17/22  0206  Arm  less than 1   Urethral Catheter Holly RN 14 Fr. 07/15/22  2102  --  33            Intake/Output Last 24 hours  Intake/Output Summary (Last 24 hours) at 08/17/2022 1105 Last data filed at 08/17/2022 [id number] Gross per 24 hour  Intake 3000 ml  Output --  Net 3000 ml    Labs/Imaging Results for orders placed or performed during the hospital encounter of 08/16/22 (from the past 48 hour(s))  Lactic acid, plasma     Status: None   Collection Time: 08/16/22  5:22 PM  Result Value Ref Range   Lactic Acid, Venous 1.4 0.5 - 1.9 mmol/L    Comment: Performed at [facility], [address] [place], [place] [address]  Comprehensive metabolic panel     Status: Abnormal   Collection Time: 08/16/22  5:22 PM  Result Value Ref Range   Sodium 133 (L) 135 - 145 mmol/L   Potassium 4.3 3.5 - 5.1 mmol/L   Chloride 98 98 - 111 mmol/L   CO2 24 22 - 32 mmol/L   Glucose, Bld 113 (H) 70 - 99 mg/dL    Comment: Glucose reference range applies only to samples taken after fasting for at least 8 hours.   BUN 20 6 - 20 mg/dL   Creatinine, Ser 1.40 (  H) 0.44 - 1.00 mg/dL   Calcium 9.1 8.9 - 10.3 mg/dL   Total Protein 6.8 6.5 - 8.1 g/dL   Albumin 3.8 3.5 - 5.0 g/dL   AST 37 15 - 41 U/L   ALT 29 0 - 44 U/L   Alkaline Phosphatase 63 38 - 126 U/L   Total Bilirubin 1.0 0.3 - 1.2 mg/dL   GFR, Estimated 50 (L) >60 mL/min    Comment: (NOTE) Calculated using the CKD-EPI Creatinine Equation (2021)    Anion gap 11 5 - 15    Comment: Performed at [facility] [address] [place], [place] [address]  CBC with Differential     Status: Abnormal   Collection Time: 08/16/22  5:22 PM  Result Value Ref Range   WBC 7.1 4.0 - 10.5 K/uL   RBC 4.78 3.87 - 5.11 MIL/uL   Hemoglobin 12.1 12.0 - 15.0 g/dL   HCT 39.4 36.0 - 46.0 %   MCV 82.4 80.0 - 100.0 fL   MCH 25.3 (L) 26.0 - 34.0 pg   MCHC 30.7 30.0 - 36.0  g/dL   RDW 16.9 (H) 11.5 - 15.5 %   Platelets 165 150 - 400 K/uL   nRBC 0.0 0.0 - 0.2 %   Neutrophils Relative % 79 %   Neutro Abs 5.6 1.7 - 7.7 K/uL   Lymphocytes Relative 5 %   Lymphs Abs 0.4 (L) 0.7 - 4.0 K/uL   Monocytes Relative 16 %   Monocytes Absolute 1.1 (H) 0.1 - 1.0 K/uL   Eosinophils Relative 0 %   Eosinophils Absolute 0.0 0.0 - 0.5 K/uL   Basophils Relative 0 %   Basophils Absolute 0.0 0.0 - 0.1 K/uL   Immature Granulocytes 0 %   Abs Immature Granulocytes 0.02 0.00 - 0.07 K/uL    Comment: Performed at [facility], [place] [address], [place], [place] [address]  Lipase, blood     Status: None   Collection Time: 08/16/22  5:22 PM  Result Value Ref Range   Lipase 30 11 - 51 U/L    Comment: Performed at [facility], [place] [address] [place], [place] [address]  I-Stat beta hCG blood, ED     Status: None   Collection Time: 08/16/22  5:43 PM  Result Value Ref Range   I-stat hCG, quantitative <5.0 <5 mIU/mL   Comment 3            Comment:   GEST. AGE      CONC.  (mIU/mL)   <=1 WEEK        5 - 50     2 WEEKS       50 - 500     3 WEEKS       100 - 10,000     4 WEEKS     1,000 - 30,000        FEMALE AND NON-PREGNANT FEMALE:     LESS THAN 5 mIU/mL   Blood culture (routine x 2)     Status: None (Preliminary result)   Collection Time: 08/16/22  7:00 PM   Specimen: BLOOD  Result Value Ref Range   Specimen Description BLOOD RIGHT ANTECUBITAL    Special Requests      BOTTLES DRAWN AEROBIC AND ANAEROBIC Blood Culture results may not be optimal due to an excessive volume of blood received in culture bottles   Culture      NO GROWTH < 12 HOURS Performed at [facility], [place] [address] [place], [place] [address]    Report Status PENDING   Lactic acid, plasma     Status: None   Collection Time: 08/16/22  8:30 PM  Result Value Ref Range   Lactic Acid, Venous 1.0 0.5 - 1.9 mmol/L    Comment: Performed at [facility] [address] [place], [place]  [address]  Blood culture (routine x 2)     Status: None (Preliminary result)   Collection Time: 08/16/22  8:40 PM   Specimen: BLOOD RIGHT HAND  Result Value Ref Range   Specimen Description BLOOD RIGHT HAND    Special Requests      BOTTLES DRAWN AEROBIC AND ANAEROBIC Blood Culture results may not be optimal due to an inadequate volume of blood received in culture bottles   Culture      NO GROWTH < 12 HOURS Performed at [facility] [address] [place], [place] [address]    Report Status PENDING   SARS Coronavirus 2 by RT PCR (hospital order, performed in [facility] hospital lab) *cepheid single result test* Anterior Nasal Swab     Status: Abnormal   Collection Time: 08/16/22  8:40 PM   Specimen: Anterior Nasal Swab  Result Value Ref Range   SARS Coronavirus 2 by RT PCR POSITIVE (A) NEGATIVE    Comment: Performed at [facility] [address], [place], [place] [address]  Urinalysis, Routine w reflex microscopic -Urine, Clean Catch     Status: Abnormal   Collection Time: 08/17/22  2:47 AM  Result Value Ref Range   Color, Urine YELLOW YELLOW   APPearance CLEAR CLEAR   Specific Gravity, Urine 1.025 1.005 - 1.030   pH 6.0 5.0 - 8.0   Glucose, UA NEGATIVE NEGATIVE mg/dL   Hgb urine dipstick SMALL (A) NEGATIVE   Bilirubin Urine NEGATIVE NEGATIVE   Ketones, ur NEGATIVE NEGATIVE mg/dL   Protein, ur NEGATIVE NEGATIVE mg/dL   Nitrite POSITIVE (A) NEGATIVE   Leukocytes,Ua NEGATIVE NEGATIVE   RBC / HPF 0-5 0 - 5 RBC/hpf   WBC, UA 0-5 0 - 5 WBC/hpf   Bacteria, UA MANY (A) NONE SEEN   Squamous Epithelial / HPF 0-5 0 - 5 /HPF    Comment: Performed at [facility], [place] [address], [place],  [address]  Procalcitonin     Status: None   Collection Time: 08/17/22  5:50 AM  Result Value Ref Range   Procalcitonin 0.31 ng/mL    Comment:        Interpretation: PCT (Procalcitonin) <= 0.5 ng/mL: Systemic infection (sepsis) is not likely. Local bacterial infection is  possible. (NOTE)       Sepsis PCT Algorithm           Lower Respiratory Tract                                      Infection PCT Algorithm    ----------------------------     ----------------------------         PCT < 0.25 ng/mL                PCT < 0.10 ng/mL          Strongly encourage             Strongly discourage   discontinuation of antibiotics    initiation of antibiotics    ----------------------------     -----------------------------  PCT 0.25 - 0.50 ng/mL            PCT 0.10 - 0.25 ng/mL               OR       >80% decrease in PCT            Discourage initiation of                                            antibiotics      Encourage discontinuation           of antibiotics    ----------------------------     -----------------------------         PCT >= 0.50 ng/mL              PCT 0.26 - 0.50 ng/mL               AND        <80% decrease in PCT             Encourage initiation of                                             antibiotics       Encourage continuation           of antibiotics    ----------------------------     -----------------------------        PCT >= 0.50 ng/mL                  PCT > 0.50 ng/mL               AND         increase in PCT                  Strongly encourage                                      initiation of antibiotics    Strongly encourage escalation           of antibiotics                                     -----------------------------                                           PCT <= 0.25 ng/mL                                                 OR                                        > 80% decrease in PCT  Discontinue / Do not initiate                                             antibiotics  Performed at [facility], [place] [address] [place], [place] [address]   HIV Antibody (routine testing w rflx)     Status: None   Collection Time: 08/17/22  5:50 AM  Result Value Ref Range   HIV  Screen 4th Generation wRfx Non Reactive Non Reactive    Comment: Performed at [facility], [place] [address], [place], [place] [address]  CBC     Status: Abnormal   Collection Time: 08/17/22  5:50 AM  Result Value Ref Range   WBC 5.0 4.0 - 10.5 K/uL   RBC 3.93 3.87 - 5.11 MIL/uL   Hemoglobin 10.2 (L) 12.0 - 15.0 g/dL   HCT 32.3 (L) 36.0 - 46.0 %   MCV 82.2 80.0 - 100.0 fL   MCH 26.0 26.0 - 34.0 pg   MCHC 31.6 30.0 - 36.0 g/dL   RDW 16.8 (H) 11.5 - 15.5 %   Platelets 118 (L) 150 - 400 K/uL   nRBC 0.0 0.0 - 0.2 %    Comment: Performed at [facility], [place] [address] [place], [place] [address]  Comprehensive metabolic panel     Status: Abnormal   Collection Time: 08/17/22  5:50 AM  Result Value Ref Range   Sodium 135 135 - 145 mmol/L   Potassium 5.1 3.5 - 5.1 mmol/L   Chloride 102 98 - 111 mmol/L   CO2 25 22 - 32 mmol/L   Glucose, Bld 104 (H) 70 - 99 mg/dL    Comment: Glucose reference range applies only to samples taken after fasting for at least 8 hours.   BUN 23 (H) 6 - 20 mg/dL   Creatinine, Ser 1.33 (H) 0.44 - 1.00 mg/dL   Calcium 8.1 (L) 8.9 - 10.3 mg/dL   Total Protein 5.2 (L) 6.5 - 8.1 g/dL   Albumin 2.9 (L) 3.5 - 5.0 g/dL   AST 39 15 - 41 U/L   ALT 25 0 - 44 U/L   Alkaline Phosphatase 51 38 - 126 U/L   Total Bilirubin 1.0 0.3 - 1.2 mg/dL   GFR, Estimated 53 (L) >60 mL/min    Comment: (NOTE) Calculated using the CKD-EPI Creatinine Equation (2021)    Anion gap 8 5 - 15    Comment: Performed at [facility], [place] [address] [place], [place] [address]   CT ABDOMEN PELVIS W CONTRAST  Result Date: 08/16/2022 CLINICAL DATA:  Acute abdominal pain EXAM: CT ABDOMEN AND PELVIS WITH CONTRAST TECHNIQUE: Multidetector CT imaging of the abdomen and pelvis was performed using the standard protocol following bolus administration of intravenous contrast. RADIATION DOSE REDUCTION: This exam was performed according to the departmental dose-optimization program which  includes automated exposure control, adjustment of the mA and/or kV according to patient size and/or use of iterative reconstruction technique. CONTRAST:  76mL OMNIPAQUE IOHEXOL 350 MG/ML SOLN COMPARISON:  02/12/2022 FINDINGS: Lower chest: No acute abnormality. Hepatobiliary: Fatty infiltration of the liver is noted. The gallbladder has been surgically removed. Changes consistent with the known history of liver transplant are seen. Pancreas: Unremarkable. No pancreatic ductal dilatation or surrounding inflammatory changes. Spleen: Normal in size without focal abnormality. Adrenals/Urinary Tract: Adrenal glands are within normal limits.  Kidneys demonstrate a normal enhancement pattern bilaterally. No renal calculi or obstructive changes are seen. The bladder is decompressed by Foley catheter. Stomach/Bowel: No obstructive or inflammatory changes of colon are seen. The appendix is not visualized. No inflammatory changes to suggest appendicitis are noted. Small bowel and stomach are within normal limits. Vascular/Lymphatic: No significant vascular findings are present. No enlarged abdominal or pelvic lymph nodes. Reproductive: IUD is noted in place.  No adnexal mass is noted. Other: No abdominal wall hernia or abnormality. No abdominopelvic ascites. Musculoskeletal: No acute or significant osseous findings. IMPRESSION: Changes consistent with prior hepatic transplant. No acute abnormality noted. Electronically Signed   By: Inez Catalina M.D.   On: 08/16/2022 21:30   DG Chest Portable 1 View  Result Date: 08/16/2022 CLINICAL DATA:  Shortness of breath EXAM: PORTABLE CHEST 1 VIEW COMPARISON:  11/12/2020 FINDINGS: The heart size and mediastinal contours are within normal limits. Both lungs are clear. The visualized skeletal structures are unremarkable. IMPRESSION: No active disease. Electronically Signed   By: Donavan Foil M.D.   On: 08/16/2022 19:21    Pending Labs Unresulted Labs (From admission, onward)      Start     Ordered   08/17/22 0446  Hemoglobin A1c  Once,   R       Comments: To assess prior glycemic control    08/17/22 0445   08/17/22 0324  Urine Culture  Add-on,   AD       Question:  Indication  Answer:  Sepsis   08/17/22 0324            Vitals/Pain Today's Vitals   08/17/22 0500 08/17/22 0600 08/17/22 0638 08/17/22 0700  BP: 113/75 109/75  110/76  Pulse: 65 62 64 62  Resp: 18 19 20 19   Temp:   99.8 F (37.7 C)   TempSrc:   Oral   SpO2: 98% 97% 96% 98%  Weight:      Height:      PainSc:        Isolation Precautions Airborne and Contact precautions  Medications Medications  hydrocortisone sodium succinate (SOLU-CORTEF) 100 MG injection 100 mg (100 mg Intravenous Given 08/17/22 0340)  cycloSPORINE modified (NEORAL) capsule 50 mg (has no administration in time range)  enoxaparin (LOVENOX) injection 40 mg (has no administration in time range)  acetaminophen (TYLENOL) tablet 650 mg (has no administration in time range)    Or  acetaminophen (TYLENOL) suppository 650 mg (has no administration in time range)  ondansetron (ZOFRAN) tablet 4 mg (has no administration in time range)    Or  ondansetron (ZOFRAN) injection 4 mg (has no administration in time range)  ALPRAZolam (XANAX) tablet 0.5-2 mg (has no administration in time range)  pantoprazole (PROTONIX) injection 40 mg (has no administration in time range)  insulin aspart (novoLOG) injection 0-15 Units ( Subcutaneous Not Given 08/17/22 0745)  insulin aspart (novoLOG) injection 0-5 Units (has no administration in time range)  molnupiravir EUA (LAGEVRIO) capsule 800 mg (has no administration in time range)  Buprenorphine HCl FILM 750 mcg (has no administration in time range)  furosemide (LASIX) tablet 40 mg (has no administration in time range)  levothyroxine (SYNTHROID) tablet 137 mcg (has no administration in time range)  mycophenolate (MYFORTIC) EC tablet 180 mg (has no administration in time range)  magnesium  oxide (MAG-OX) tablet 800 mg (has no administration in time range)  Tenofovir Alafenamide Fumarate TABS 25 mg (has no administration in time range)  lactated ringers bolus 1,000 mL (0  mLs Intravenous Stopped 08/17/22 0309)  ketorolac (TORADOL) 15 MG/ML injection 15 mg (15 mg Intravenous Given 08/16/22 2034)  ondansetron (ZOFRAN) injection 4 mg (4 mg Intravenous Given 08/16/22 2034)  iohexol (OMNIPAQUE) 350 MG/ML injection 65 mL (65 mLs Intravenous Contrast Given 08/16/22 2126)  lactated ringers bolus 1,000 mL (0 mLs Intravenous Stopped 08/17/22 0315)  acetaminophen (TYLENOL) tablet 650 mg (650 mg Oral Given 08/16/22 2234)  lactated ringers bolus 1,000 mL (0 mLs Intravenous Stopped 08/17/22 [id number])    Mobility walks     Focused Assessments Really unsure of the assessment   R Recommendations: See Admitting Provider Note  Report given to:   Additional Notes: AOX4, came in with a indwelling catheter, liver transplant, had a fever, Covid +

## 2022-08-17 NOTE — Care Management (Signed)
  Transition of Care [facility]) Screening Note   Patient Details  Name: Patricia Lutz Date of Birth: 25-Apr-1986   Transition of Care [facility]) CM/SW Contact:    Levonne Lapping, RN Phone Number: 08/17/2022, 1:43 PM    Transition of Care Department [facility]) has reviewed patient and no TOC needs have been identified at this time. We will continue to monitor patient advancement through interdisciplinary progression rounds. If new patient transition needs arise, please place a TOC consult.

## 2022-08-17 NOTE — Assessment & Plan Note (Addendum)
Technically 'mild' COVID-19 with no O2 requirement. However, concerned due to soft BPs in ED, immunosuppressed status. EDP discussed with pharmacy: they recd Molnupiravir for treatment given that she is on anti-rejection meds that will interact with paxlovid. COVID-19 pathway Molnupiravir per pharm recs Will admit for observation for the moment given soft BPs in ED, immunosuppressed state. As discussed below however; think soft BPs probably due to adrenal insufficiency, now seem improved following empiric stress dose steroids.

## 2022-08-17 NOTE — H&P (Signed)
History and Physical    Patient: Patricia Lutz DOB: 06-May-1986 DOA: 08/16/2022 DOS: the patient was seen and examined on 08/17/2022 PCP: Lillia Corporal, MD  Patient coming from: Home  Chief Complaint:  Chief Complaint  Patient presents with   Abdominal Pain   Fever   HPI: Patricia Lutz is a 37 y.o. female with medical history significant of prior EtOH abuse, EtOH cirrhosis s/p liver transplant, addison's disease.  Pt in to ED with c/o fever, R sided abd pain, N/V.  Symptoms onset with cough and sore throat.  Son sick with similar symptoms.  Also has chronic urinary retention and uses chronic foley.  No symptoms of dysuria or UTI however.  Has severe headache, no neck stiffness.     Review of Systems: As mentioned in the history of present illness. All other systems reviewed and are negative. Past Medical History:  Diagnosis Date   Anxiety    Cirrhosis ([place])    Depression    Diabetes mellitus without complication ([place])    GERD (gastroesophageal reflux disease)    Hashimoto's disease    History of stomach ulcers    Hypothyroidism    Substance abuse ([place])    alcohol   Past Surgical History:  Procedure Laterality Date   CESAREAN SECTION     LIVER TRANSPLANT     Social History:  reports that she has never smoked. She has never used smokeless tobacco. She reports that she does not currently use alcohol. She reports that she does not use drugs.  Allergies  Allergen Reactions   Ibuprofen Nausea And Vomiting and Other (See Comments)    Family History  Problem Relation Age of Onset   Crohn's disease Mother    Crohn's disease Maternal Grandmother    Diabetes Brother     Prior to Admission medications   Medication Sig Start Date End Date Taking? Authorizing Provider  alprazolam Duanne Moron) 2 MG tablet Take 2 mg by mouth 3 (three) times daily. 12/14/20   [provider]  ASPIRIN LOW DOSE 81 MG EC tablet Take 81 mg by mouth daily. 12/31/20   [provider]  buprenorphine (BUTRANS) 20 MCG/HR PTWK Place 1 patch onto the skin once a week. 01/25/22   [provider]  Calcium Citrate-Vitamin D3 315-6.25 MG-MCG TABS Take 2 tablets by mouth in the morning and at bedtime. 09/28/19   [provider]  clonazePAM (KLONOPIN) 2 MG tablet Take 1 tablet (2 mg total) by mouth at bedtime. Patient taking differently: Take 2 mg by mouth at bedtime as needed for anxiety. 08/10/20 01/07/21  Pucilowski, Marchia Bond, MD  cycloSPORINE modified (NEORAL) 25 MG capsule Take 50 mg by mouth in the morning and at bedtime. 12/16/20   [provider]  dicyclomine (BENTYL) 20 MG tablet Take 1 tablet (20 mg total) by mouth 2 (two) times daily. Patient not taking: Reported on 02/12/2022 08/22/21   Dion Saucier A, PA  famotidine (PEPCID) 40 MG tablet Take 40 mg by mouth at bedtime.    [provider]  hydrocortisone (CORTEF) 5 MG tablet Take 10-15 mg by mouth See admin instructions. Take 15 mg by mouth in the morning and then take 10 mg by mouth in the evening per patient 11/17/21   [provider]  insulin lispro (HUMALOG) 100 UNIT/ML KwikPen Inject 5 Units into the skin 3 (three) times daily as needed (high blood sugar). Take 1 unit if BS>150 01/01/21   [provider]  lansoprazole (PREVACID) 15  MG capsule Take 30 mg by mouth 2 (two) times daily before a meal.    [provider]  levothyroxine (SYNTHROID) 137 MCG tablet Take 137 mcg by mouth daily before breakfast.    [provider]  magnesium oxide (MAG-OX) 400 MG tablet Take 2 tablets by mouth 2 (two) times daily. 11/01/20   [provider]  mycophenolate (MYFORTIC) 180 MG EC tablet Take 180 mg by mouth 2 (two) times daily. 10/27/20   [provider]  norethindrone (AYGESTIN) 5 MG tablet Take 1 tablet by mouth daily. 11/03/21   [provider]  ondansetron (ZOFRAN-ODT) 4 MG disintegrating tablet Take 4 mg by mouth every 8 (eight)  hours as needed for nausea or vomiting.  04/30/19   [provider]  pantoprazole (PROTONIX) 40 MG tablet Take 1 tablet (40 mg total) by mouth 2 (two) times daily. Patient not taking: Reported on 01/07/2021 05/24/16   Jola Schmidt, MD  promethazine (PHENERGAN) 25 MG tablet Take 25 mg by mouth every 8 (eight) hours as needed for nausea or vomiting. 01/25/22   [provider]  VEMLIDY 25 MG TABS Take 1 tablet by mouth daily. 11/30/20   [provider]  zolpidem (AMBIEN) 10 MG tablet Take 10 mg by mouth at bedtime. 01/01/21   [provider]    Physical Exam: Vitals:   08/17/22 0030 08/17/22 0130 08/17/22 0300 08/17/22 0400  BP: (!) 90/59 91/60 (!) 86/57 101/69  Pulse: 82 72 68 66  Resp:   18 19  Temp:      TempSrc:      SpO2: 99% 97% 95% 98%  Weight:      Height:       Constitutional: NAD, calm, comfortable, sleepy Neck: normal, supple, no masses, no thyromegaly Respiratory: clear to auscultation bilaterally, no wheezing, no crackles. Normal respiratory effort. No accessory muscle use.  Cardiovascular: Regular rate and rhythm, no murmurs / rubs / gallops. No extremity edema. 2+ pedal pulses. No carotid bruits.  Abdomen: RUQ and RLQ TTP Musculoskeletal: no clubbing / cyanosis. No joint deformity upper and lower extremities. Good ROM, no contractures. Normal muscle tone.  Skin: no rashes, lesions, ulcers. No induration Neurologic: CN 2-12 grossly intact. Sensation intact, DTR normal. Strength 5/5 in all 4.  Psychiatric: Normal judgment and insight. Alert and oriented x 3. Normal mood.   Data Reviewed:       Latest Ref Rng & Units 08/16/2022    5:22 PM 04/25/2022   12:40 PM 02/12/2022   11:56 AM  CBC  WBC 4.0 - 10.5 K/uL 7.1  8.9  9.3   Hemoglobin 12.0 - 15.0 g/dL 12.1  12.7  13.1   Hematocrit 36.0 - 46.0 % 39.4  39.9  42.4   Platelets 150 - 400 K/uL 165  221  275       Latest Ref Rng & Units 08/16/2022    5:22 PM 04/25/2022   12:40 PM 02/12/2022    11:56 AM  CMP  Glucose 70 - 99 mg/dL 113  103  90   BUN 6 - 20 mg/dL 20  20  19    Creatinine 0.44 - 1.00 mg/dL 1.40  1.20  1.17   Sodium 135 - 145 mmol/L 133  136  140   Potassium 3.5 - 5.1 mmol/L 4.3  3.7  3.8   Chloride 98 - 111 mmol/L 98  97  105   CO2 22 - 32 mmol/L 24  26  24    Calcium 8.9 -  10.3 mg/dL 9.1  9.3  9.5   Total Protein 6.5 - 8.1 g/dL 6.8  7.4  7.6   Total Bilirubin 0.3 - 1.2 mg/dL 1.0  1.1  1.0   Alkaline Phos 38 - 126 U/L 63  64  48   AST 15 - 41 U/L 37  32  26   ALT 0 - 44 U/L 29  31  27     COVID-19 positive  Urinalysis    Component Value Date/Time   COLORURINE YELLOW 08/17/2022 [place] 08/17/2022 0247   LABSPEC 1.025 08/17/2022 0247   PHURINE 6.0 08/17/2022 0247   GLUCOSEU NEGATIVE 08/17/2022 0247   HGBUR SMALL (A) 08/17/2022 0247   BILIRUBINUR NEGATIVE 08/17/2022 0247   KETONESUR NEGATIVE 08/17/2022 0247   PROTEINUR NEGATIVE 08/17/2022 0247   NITRITE POSITIVE (A) 08/17/2022 0247   LEUKOCYTESUR NEGATIVE 08/17/2022 0247    CTAP - hepatic transplant, no acute findings.  CXR neg  Lactate 1.4 -> 1.0  Assessment and Plan: * COVID-19 Technically 'mild' COVID-19 with no O2 requirement. However, concerned due to soft BPs in ED, immunosuppressed status. EDP discussed with pharmacy: they recd Molnupiravir for treatment given that she is on anti-rejection meds that will interact with paxlovid. COVID-19 pathway Molnupiravir per pharm recs Will admit for observation for the moment given soft BPs in ED, immunosuppressed state. As discussed below however; think soft BPs probably due to adrenal insufficiency, now seem improved following empiric stress dose steroids.  Adrenal insufficiency (Addison's disease) ([place]) Pt with longstanding adrenal insufficiency. On cortef chronically. BPs persistently soft in ED despite IVF. Gave empiric stress dose steroids in ED. BP now much improved it appears following stress dose steroids. Continue stress  dose solucortef for the moment. SSI mod scale AC/HS  Liver transplant status ([place]) Pt s/p liver transplant for EtOH cirrhosis. ? Of HCC of transplanted liver (biopsy inconclusive), monitored closely by her transplant team at [facility] cyclosporine 50mg  BID she states (continuing this) No longer on Mycophenolate she reports  Chronic indwelling Foley catheter UA not really impressive for UTI UCx pending  History of ulcer disease PPI empirically with the stress dose steroids.  Alcohol use disorder, moderate, in sustained remission ([place]) Long term remission. Does look like she uses very high doses / frequency of xanax however.  Up to 2mg  TID per script.  Sounds like she typically takes 2mg  total daily dose in divided doses per pal notes. Will order PRN xanax TID (dont want her to go into withdrawal).      Advance Care Planning:   Code Status: Full Code   Consults: None  Family Communication: No family in room  Severity of Illness: The appropriate patient status for this patient is OBSERVATION. Observation status is judged to be reasonable and necessary in order to provide the required intensity of service to ensure the patient's safety. The patient's presenting symptoms, physical exam findings, and initial radiographic and laboratory data in the context of their medical condition is felt to place them at decreased risk for further clinical deterioration. Furthermore, it is anticipated that the patient will be medically stable for discharge from the hospital within 2 midnights of admission.   Author: Etta Quill., DO 08/17/2022 4:44 AM  For on call review [email or web address].

## 2022-08-17 NOTE — ED Notes (Signed)
Patient left the floor in stable condition with her belongings and family.

## 2022-08-17 NOTE — Assessment & Plan Note (Addendum)
Pt s/p liver transplant for EtOH cirrhosis. ? Of HCC of transplanted liver (biopsy inconclusive), monitored closely by her transplant team at [facility] cyclosporine 50mg  BID she states (continuing this) No longer on Mycophenolate she reports

## 2022-08-17 NOTE — Assessment & Plan Note (Addendum)
UA not really impressive for UTI UCx pending

## 2022-08-18 DIAGNOSIS — Z944 Liver transplant status: Secondary | ICD-10-CM | POA: Diagnosis not present

## 2022-08-18 DIAGNOSIS — E271 Primary adrenocortical insufficiency: Secondary | ICD-10-CM | POA: Diagnosis not present

## 2022-08-18 DIAGNOSIS — U071 COVID-19: Secondary | ICD-10-CM | POA: Diagnosis not present

## 2022-08-18 DIAGNOSIS — Z978 Presence of other specified devices: Secondary | ICD-10-CM | POA: Diagnosis not present

## 2022-08-18 LAB — HEMOGLOBIN A1C
Hgb A1c MFr Bld: 5.3 % (ref 4.8–5.6)
Mean Plasma Glucose: 105 mg/dL

## 2022-08-18 MED ORDER — MOLNUPIRAVIR EUA 200MG CAPSULE: 4.0000 | ORAL_CAPSULE | Freq: Two times a day (BID) | ORAL | 0 refills | Status: AC

## 2022-08-18 NOTE — Discharge Summary (Addendum)
Physician Discharge Summary   Patient: Patricia Lutz MRN: [id number] DOB: [birth year]  Admit date:     08/16/2022  Discharge date: 08/18/22  Discharge Physician: Oswald Hillock   PCP: Lillia Corporal, MD   Recommendations at discharge:   Follow-up PCP in 1 week  Discharge Diagnoses: Principal Problem:   COVID-19 Active Problems:   Liver transplant status ([place])   Adrenal insufficiency (Addison's disease) ([facility])   Alcohol use disorder, moderate, in sustained remission ([facility])   History of ulcer disease   Chronic indwelling Foley catheter  Resolved Problems:   * No resolved hospital problems. *  Hospital Course:  37 year old female with history of prior EtOH abuse, EtOH cirrhosis, s/p liver transplant, Addison's disease came to ED with complaints of fever, right-sided abdominal pain, nausea vomiting.  Also had fever with cough and sore throat.  She has chronic urine retention and uses chronic Foley catheter.  No symptoms of dysuria or UTI. Patient was found to be positive for COVID-19.  Started on molnupiravir  Assessment and Plan:  * COVID-19 Technically 'mild' COVID-19 with no O2 requirement. However, concerned due to soft BPs in ED, immunosuppressed status. EDP discussed with pharmacy: they recd Molnupiravir for treatment given that she is on anti-rejection meds that will interact with paxlovid. Patient is still not requiring oxygen.  Feels much better. Wants to go home.  Will discharge on molnupiravir for total 5 days.  Adrenal insufficiency (Addison's disease) ([place]) Pt with longstanding adrenal insufficiency. On cortef chronically. BPs persistently soft in ED despite IVF. Gave empiric stress dose steroids in ED. BP now much improved it appears following stress dose steroids. Will discharge home, patient will continue taking hydrocortisone as per home regimen.  Liver transplant status ([place]) Pt s/p liver transplant for EtOH cirrhosis. ? Of HCC of transplanted liver  (biopsy inconclusive), monitored closely by her transplant team at [facility] cyclosporine 50mg  BID she states (continuing this) No longer on Mycophenolate she reports  Chronic indwelling Foley catheter UA not really impressive for UTI   History of ulcer disease PPI empirically with the stress dose steroids.  Alcohol use disorder, moderate, in sustained remission ([place]) Long term remission. Does look like she uses very high doses / frequency of xanax however.      CKD stage III a -Creatinine 1.33, at baseline       Consultants:  Procedures performed:  Disposition: Home Diet recommendation:  Discharge Diet Orders (From admission, onward)     Start     Ordered   08/18/22 0000  Diet - low sodium heart healthy        08/18/22 1114           Regular diet DISCHARGE MEDICATION: Allergies as of 08/18/2022       Reactions   Ibuprofen Nausea And Vomiting, Other (See Comments)        Medication List     TAKE these medications    alprazolam 2 MG tablet Commonly known as: XANAX Take 2 mg by mouth 3 (three) times daily as needed for anxiety.   Aspirin Low Dose 81 MG tablet Generic drug: aspirin EC Take 81 mg by mouth daily.   Belbuca 750 MCG Film Generic drug: Buprenorphine HCl Take 750 mcg by mouth every 12 (twelve) hours.   Calcium Citrate-Vitamin D3 315-6.25 MG-MCG Tabs Take 2 tablets by mouth 2 (two) times daily at 10 AM and 5 PM.   cycloSPORINE modified 25 MG capsule Commonly known as: NEORAL Take 50 mg by mouth  in the morning and at bedtime.   Dexcom G7 Sensor Misc USE 1 EACH EVERY 10 DAYS   famotidine 40 MG tablet Commonly known as: PEPCID Take 40 mg by mouth at bedtime.   furosemide 20 MG tablet Commonly known as: LASIX Take 40 mg by mouth daily.   hydrocortisone 5 MG tablet Commonly known as: CORTEF Take 10-20 mg by mouth See admin instructions. Take 20 mg by mouth in the morning and then take 10 mg by mouth at 3pm per patient    lansoprazole 15 MG capsule Commonly known as: PREVACID Take 30 mg by mouth 2 (two) times daily before a meal.   levothyroxine 137 MCG tablet Commonly known as: SYNTHROID Take 137 mcg by mouth daily before breakfast.   magnesium oxide 400 MG tablet Commonly known as: MAG-OX Take 2 tablets by mouth 2 (two) times daily.   molnupiravir EUA 200 mg Caps capsule Commonly known as: LAGEVRIO Take 4 capsules (800 mg total) by mouth 2 (two) times daily for 5 days. Complete the tablets in the bottle   mupirocin ointment 2 % Commonly known as: BACTROBAN Apply 1 Application topically daily.   NovoLOG FlexPen 100 UNIT/ML FlexPen Generic drug: insulin aspart Inject 0-10 Units into the skin 3 (three) times daily with meals. Sliding scale   ondansetron 4 MG disintegrating tablet Commonly known as: ZOFRAN-ODT Take 4 mg by mouth every 8 (eight) hours as needed for nausea or vomiting.   Vemlidy 25 MG Tabs Generic drug: Tenofovir Alafenamide Fumarate Take 1 tablet by mouth daily.   zolpidem 10 MG tablet Commonly known as: AMBIEN Take 10 mg by mouth at bedtime as needed for sleep.        Discharge Exam: Filed Weights   08/16/22 1711 08/17/22 1234  Weight: 81.2 kg 81.1 kg   General-appears in no acute distress Heart-S1-S2, regular, no murmur auscultated Lungs-clear to auscultation bilaterally, no wheezing or crackles auscultated Abdomen-soft, nontender, no organomegaly Extremities-no edema in the lower extremities Neuro-alert, oriented x3, no focal deficit noted  Condition at discharge: good  The results of significant diagnostics from this hospitalization (including imaging, microbiology, ancillary and laboratory) are listed below for reference.   Imaging Studies: CT ABDOMEN PELVIS W CONTRAST  Result Date: 08/16/2022 CLINICAL DATA:  Acute abdominal pain EXAM: CT ABDOMEN AND PELVIS WITH CONTRAST TECHNIQUE: Multidetector CT imaging of the abdomen and pelvis was performed using the  standard protocol following bolus administration of intravenous contrast. RADIATION DOSE REDUCTION: This exam was performed according to the departmental dose-optimization program which includes automated exposure control, adjustment of the mA and/or kV according to patient size and/or use of iterative reconstruction technique. CONTRAST:  33mL OMNIPAQUE IOHEXOL 350 MG/ML SOLN COMPARISON:  02/12/2022 FINDINGS: Lower chest: No acute abnormality. Hepatobiliary: Fatty infiltration of the liver is noted. The gallbladder has been surgically removed. Changes consistent with the known history of liver transplant are seen. Pancreas: Unremarkable. No pancreatic ductal dilatation or surrounding inflammatory changes. Spleen: Normal in size without focal abnormality. Adrenals/Urinary Tract: Adrenal glands are within normal limits. Kidneys demonstrate a normal enhancement pattern bilaterally. No renal calculi or obstructive changes are seen. The bladder is decompressed by Foley catheter. Stomach/Bowel: No obstructive or inflammatory changes of colon are seen. The appendix is not visualized. No inflammatory changes to suggest appendicitis are noted. Small bowel and stomach are within normal limits. Vascular/Lymphatic: No significant vascular findings are present. No enlarged abdominal or pelvic lymph nodes. Reproductive: IUD is noted in place.  No adnexal mass is noted.  Other: No abdominal wall hernia or abnormality. No abdominopelvic ascites. Musculoskeletal: No acute or significant osseous findings. IMPRESSION: Changes consistent with prior hepatic transplant. No acute abnormality noted. Electronically Signed   By: Inez Catalina M.D.   On: 08/16/2022 21:30   DG Chest Portable 1 View  Result Date: 08/16/2022 CLINICAL DATA:  Shortness of breath EXAM: PORTABLE CHEST 1 VIEW COMPARISON:  11/12/2020 FINDINGS: The heart size and mediastinal contours are within normal limits. Both lungs are clear. The visualized skeletal structures  are unremarkable. IMPRESSION: No active disease. Electronically Signed   By: Donavan Foil M.D.   On: 08/16/2022 19:21    Microbiology: Results for orders placed or performed during the hospital encounter of 08/16/22  Blood culture (routine x 2)     Status: None (Preliminary result)   Collection Time: 08/16/22  7:00 PM   Specimen: BLOOD  Result Value Ref Range Status   Specimen Description BLOOD RIGHT ANTECUBITAL  Final   Special Requests   Final    BOTTLES DRAWN AEROBIC AND ANAEROBIC Blood Culture results may not be optimal due to an excessive volume of blood received in culture bottles   Culture   Final    NO GROWTH 2 DAYS Performed at [facility] [address] [place], [place] [address]    Report Status PENDING  Incomplete  Blood culture (routine x 2)     Status: None (Preliminary result)   Collection Time: 08/16/22  8:40 PM   Specimen: BLOOD RIGHT HAND  Result Value Ref Range Status   Specimen Description BLOOD RIGHT HAND  Final   Special Requests   Final    BOTTLES DRAWN AEROBIC AND ANAEROBIC Blood Culture results may not be optimal due to an inadequate volume of blood received in culture bottles   Culture   Final    NO GROWTH 2 DAYS Performed at [facility], [place] [address], [place], [place] [address]    Report Status PENDING  Incomplete  SARS Coronavirus 2 by RT PCR (hospital order, performed in [facility] hospital lab) *cepheid single result test* Anterior Nasal Swab     Status: Abnormal   Collection Time: 08/16/22  8:40 PM   Specimen: Anterior Nasal Swab  Result Value Ref Range Status   SARS Coronavirus 2 by RT PCR POSITIVE (A) NEGATIVE Final    Comment: Performed at [facility], [place] [address] [place], [place] [address]    Labs: CBC: Recent Labs  Lab 08/16/22 1722 08/17/22 0550  WBC 7.1 5.0  NEUTROABS 5.6  --   HGB 12.1 10.2*  HCT 39.4 32.3*  MCV 82.4 82.2  PLT 165 [id number]*   Basic Metabolic Panel: Recent Labs  Lab 08/16/22 1722  08/17/22 0550  NA 133* 135  K 4.3 5.1  CL 98 102  CO2 24 25  GLUCOSE 113* 104*  BUN 20 23*  CREATININE 1.40* 1.33*  CALCIUM 9.1 8.1*   Liver Function Tests: Recent Labs  Lab 08/16/22 1722 08/17/22 0550  AST 37 39  ALT 29 25  ALKPHOS 63 51  BILITOT 1.0 1.0  PROT 6.8 5.2*  ALBUMIN 3.8 2.9*   CBG: Recent Labs  Lab 08/17/22 1205  GLUCAP 106*    Discharge time spent: greater than 30 minutes.  Signed: Oswald Hillock, MD [facility] 08/18/2022

## 2022-08-21 LAB — URINE CULTURE: Culture: >=100000 — AB

## 2022-08-21 LAB — CULTURE, BLOOD (ROUTINE X 2)
Culture: NO GROWTH
Culture: NO GROWTH

## 2022-09-07 ENCOUNTER — Other Ambulatory Visit (HOSPITAL_COMMUNITY): Payer: Self-pay | Admitting: Urology

## 2022-09-07 DIAGNOSIS — R339 Retention of urine, unspecified: Secondary | ICD-10-CM

## 2022-09-21 ENCOUNTER — Other Ambulatory Visit: Payer: Self-pay | Admitting: Student

## 2022-09-22 ENCOUNTER — Encounter (HOSPITAL_COMMUNITY): Payer: Self-pay

## 2022-09-22 NOTE — H&P (Signed)
Chief Complaint: Urinary retention with recurrent UTI's. Request is for suprapubic catheter placement  Referring Physician(s): Winter,Christopher Clifton Custard  Supervising Physician: Simonne Come  Patient Status: [facility] - In-pt  History of Present Illness: Patricia Lutz is a 37 y.o. female female history of DM, GERD, hashimoto's disease. 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 Presented to the ED on 9.25.23 with several months of worsening abdominal bloating.  Found to have urinary retntion. A foley catheter was placed Patient Per urology notes dated 5.6.2024 The patient's liver transplant team at [facility] states that the [facility] will no longer accept female patient with indwelling foley catheters. Patient presents for suprapubic catheter placement.   Currently without any significant complaint s n bed,calm. Endorses abdominal distention. Denies any fevers, headache, chest pain, SOB, cough, abdominal pain, nausea, vomiting or bleeding. Return precautions and treatment recommendations and follow-up discussed with the patient  who is agreeable with the plan.    Past Medical History:  Diagnosis Date   Anxiety    Cirrhosis (HCC)    Depression    Diabetes mellitus without complication (HCC)    GERD (gastroesophageal reflux disease)    Hashimoto's disease    History of stomach ulcers    Hypothyroidism    Substance abuse (HCC)    alcohol    Past Surgical History:  Procedure Laterality Date   CESAREAN SECTION     LIVER TRANSPLANT      Allergies: Ibuprofen  Medications: Prior to Admission medications   Medication Sig Start Date End Date Taking? Authorizing Provider  alprazolam Prudy Feeler) 2 MG tablet Take 2 mg by mouth 3 (three) times daily as needed for anxiety. 12/14/20   [provider]  ASPIRIN LOW DOSE 81 MG EC tablet Take 81 mg by mouth daily. 12/31/20    [provider]  BELBUCA 750 MCG FILM Take 750 mcg by mouth every 12 (twelve) hours.    [provider]  Calcium Citrate-Vitamin D3 315-6.25 MG-MCG TABS Take 2 tablets by mouth 2 (two) times daily at 10 AM and 5 PM. 09/28/19   [provider]  Continuous Blood Gluc Sensor (DEXCOM G7 SENSOR) MISC USE 1 EACH EVERY 10 DAYS    [provider]  cycloSPORINE modified (NEORAL) 25 MG capsule Take 50 mg by mouth in the morning and at bedtime. 12/16/20   [provider]  famotidine (PEPCID) 40 MG tablet Take 40 mg by mouth at bedtime.    [provider]  furosemide (LASIX) 20 MG tablet Take 40 mg by mouth daily.    [provider]  hydrocortisone (CORTEF) 5 MG tablet Take 10-20 mg by mouth See admin instructions. Take 20 mg by mouth in the morning and then take 10 mg by mouth at 3pm per patient 11/17/21   [provider]  lansoprazole (PREVACID) 15 MG capsule Take 30 mg by mouth 2 (two) times daily before a meal.    [provider]  levothyroxine (SYNTHROID) 137 MCG tablet Take 137 mcg by mouth daily before breakfast.    [provider]  magnesium oxide (MAG-OX) 400 MG tablet Take 2 tablets by mouth 2 (two) times daily. 11/01/20   [provider]  mupirocin ointment (BACTROBAN) 2 % Apply 1 Application topically daily. 08/01/22   [provider]  NOVOLOG FLEXPEN 100 UNIT/ML FlexPen Inject 0-10 Units into the skin 3 (three) times daily with meals. Sliding  scale    [provider]  ondansetron (ZOFRAN-ODT) 4 MG disintegrating tablet Take 4 mg by mouth every 8 (eight) hours as needed for nausea or vomiting.  04/30/19   [provider]  VEMLIDY 25 MG TABS Take 1 tablet by mouth daily. 11/30/20   [provider]  zolpidem (AMBIEN) 10 MG tablet Take 10 mg by mouth at bedtime as needed for sleep. 01/01/21   [provider]     Family History  Problem Relation Age of Onset    Crohn's disease Mother    Crohn's disease Maternal Grandmother    Diabetes Brother     Social History   Socioeconomic History   Marital status: Unknown    Spouse name: Not on file   Number of children: Not on file   Years of education: Not on file   Highest education level: Not on file  Occupational History   Not on file  Tobacco Use   Smoking status: Never   Smokeless tobacco: Never  Vaping Use   Vaping Use: Never used  Substance and Sexual Activity   Alcohol use: Not Currently    Alcohol/week: 0.0 standard drinks of alcohol   Drug use: No   Sexual activity: Not on file  Other Topics Concern   Not on file  Social History Narrative   Not on file   Social Determinants of Health   Financial Resource Strain: Not on file  Food Insecurity: No Food Insecurity (08/17/2022)   Hunger Vital Sign    Worried About Running Out of Food in the Last Year: Never true    Ran Out of Food in the Last Year: Never true  Transportation Needs: No Transportation Needs (08/17/2022)   PRAPARE - Administrator, Civil Service (Medical): No    Lack of Transportation (Non-Medical): No  Physical Activity: Not on file  Stress: Not on file  Social Connections: Not on file     Review of Systems: A 12 point ROS discussed and pertinent positives are indicated in the HPI above.  All other systems are negative.  Review of Systems  Constitutional:  Negative for fatigue and fever.  HENT:  Negative for congestion.   Respiratory:  Negative for cough and shortness of breath.   Gastrointestinal:  Positive for abdominal distention. Negative for abdominal pain, diarrhea, nausea and vomiting.    Vital Signs: BP 129/85   Pulse 79   Temp 98.2 F (36.8 C) (Temporal)   Resp 16   Ht 5\' 2"  (1.575 m)   Wt 166 lb (75.3 kg)   SpO2 100%   BMI 30.36 kg/m     Physical Exam Vitals and nursing note reviewed.  Constitutional:      Appearance: Normal appearance. She is well-developed.  HENT:      Head: Normocephalic and atraumatic.  Eyes:     Conjunctiva/sclera: Conjunctivae normal.  Cardiovascular:     Rate and Rhythm: Normal rate and regular rhythm.     Heart sounds: Normal heart sounds.  Pulmonary:     Effort: Pulmonary effort is normal.     Breath sounds: Normal breath sounds.  Abdominal:     General: Abdomen is flat.  Genitourinary:    Comments: Foley catheter in place Musculoskeletal:        General: Normal range of motion.     Cervical back: Normal range of motion.  Skin:    General: Skin is warm and dry.     Comments: Stretch marks noted to  abdomen  Neurological:     General: No focal deficit present.     Mental Status: She is alert and oriented to person, place, and time.  Psychiatric:        Mood and Affect: Mood normal.        Behavior: Behavior normal.        Thought Content: Thought content normal.        Judgment: Judgment normal.     Imaging: No results found.  Labs:  CBC: Recent Labs    02/12/22 1156 04/25/22 1240 08/16/22 1722 08/17/22 0550  WBC 9.3 8.9 7.1 5.0  HGB 13.1 12.7 12.1 10.2*  HCT 42.4 39.9 39.4 32.3*  PLT 275 221 165 118*    COAGS: No results for input(s): "INR", "APTT" in the last 8760 hours.  BMP: Recent Labs    02/12/22 1156 04/25/22 1240 08/16/22 1722 08/17/22 0550  NA 140 136 133* 135  K 3.8 3.7 4.3 5.1  CL 105 97* 98 102  CO2 24 26 24 25   GLUCOSE 90 103* 113* 104*  BUN 19 20 20  23*  CALCIUM 9.5 9.3 9.1 8.1*  CREATININE 1.17* 1.20* 1.40* 1.33*  GFRNONAA >60 60* 50* 53*    LIVER FUNCTION TESTS: Recent Labs    02/12/22 1156 04/25/22 1240 08/16/22 1722 08/17/22 0550  BILITOT 1.0 1.1 1.0 1.0  AST 26 32 37 39  ALT 27 31 29 25   ALKPHOS 48 64 63 51  PROT 7.6 7.4 6.8 5.2*  ALBUMIN 4.6 4.3 3.8 2.9*    Assessment and Plan:  37 y.o. female history of DM, GERD, hashimoto's disease. Hypothyroidism, Addison disease,  chronic UTI's cirrhosis s/p liver transplant  at [facility] 2 years ago complicayed by  anastomitc stricture s/p  lysis of adhesions. Presented to the ED on 9.25.23 with several months of worsening abdominal bloating.  Found to have urinary retntion. A foley catheter was placed Patient Per urology notes dated 5.6.2024 The patient's liver transplant team at [facility] states that the [facility] will no longer accept female patient with indwelling foley catheters. Patient presents for suprapubic catheter placement.   Labs pending today. Patient is on 81 mg of ASA. Currently being held for 5 days Patient is currently on doxycycline for UTI. All other medications are within acceptable parameters. Patient has been NPO since midnight.  Risks and benefits discussed with the patient including bleeding, infection, damage to adjacent structures, bowel perforation/fistula connection, and sepsis.  All of the patient's questions were answered, patient is agreeable to proceed. Consent signed and in chart.   Allergies include ibuprofen.   Thank you for this interesting consult.  I greatly enjoyed meeting Ahsaki Fesperman and look forward to participating in their care.  A copy of this report was sent to the requesting provider on this date.  Electronically Signed: Alene Mires, NP 09/24/2022, 10:03 AM   I spent a total of  30 Minutes   in face to face in clinical consultation, greater than 50% of which was counseling/coordinating care for supra pubic catheter placement

## 2022-09-24 ENCOUNTER — Ambulatory Visit (HOSPITAL_COMMUNITY)
Admission: RE | Admit: 2022-09-24 | Discharge: 2022-09-24 | Disposition: A | Discharge status: Home / Self Care | Payer: PRIVATE HEALTH INSURANCE | Source: Ambulatory Visit | Attending: Urology | Admitting: Urology

## 2022-09-24 ENCOUNTER — Other Ambulatory Visit: Payer: Self-pay

## 2022-09-24 VITALS — BP 110/74 | HR 62 | Temp 98.2°F | Resp 16 | Ht 62.0 in | Wt 166.0 lb

## 2022-09-24 DIAGNOSIS — R339 Retention of urine, unspecified: Secondary | ICD-10-CM | POA: Insufficient documentation

## 2022-09-24 DIAGNOSIS — E039 Hypothyroidism, unspecified: Secondary | ICD-10-CM | POA: Diagnosis not present

## 2022-09-24 DIAGNOSIS — Z944 Liver transplant status: Secondary | ICD-10-CM | POA: Insufficient documentation

## 2022-09-24 DIAGNOSIS — K219 Gastro-esophageal reflux disease without esophagitis: Secondary | ICD-10-CM | POA: Diagnosis not present

## 2022-09-24 DIAGNOSIS — E119 Type 2 diabetes mellitus without complications: Secondary | ICD-10-CM | POA: Insufficient documentation

## 2022-09-24 DIAGNOSIS — Z7982 Long term (current) use of aspirin: Secondary | ICD-10-CM | POA: Diagnosis not present

## 2022-09-24 DIAGNOSIS — Z978 Presence of other specified devices: Secondary | ICD-10-CM

## 2022-09-24 LAB — CBC
HCT: 39.5 % (ref 36.0–46.0)
Hemoglobin: 13 g/dL (ref 12.0–15.0)
MCH: 26.4 pg (ref 26.0–34.0)
MCHC: 32.9 g/dL (ref 30.0–36.0)
MCV: 80.1 fL (ref 80.0–100.0)
Platelets: 150 10*3/uL (ref 150–400)
RBC: 4.93 MIL/uL (ref 3.87–5.11)
RDW: 19 % — ABNORMAL HIGH (ref 11.5–15.5)
WBC: 8.3 10*3/uL (ref 4.0–10.5)
nRBC: 0 % (ref 0.0–0.2)

## 2022-09-24 LAB — GLUCOSE, CAPILLARY
Glucose-Capillary: 66 mg/dL — ABNORMAL LOW (ref 70–99)
Glucose-Capillary: 71 mg/dL (ref 70–99)
Glucose-Capillary: 69 mg/dL — ABNORMAL LOW (ref 70–99)
Glucose-Capillary: 80 mg/dL (ref 70–99)

## 2022-09-24 LAB — PROTIME-INR
INR: 1.1 (ref 0.8–1.2)
Prothrombin Time: 14.1 seconds (ref 11.4–15.2)

## 2022-09-24 LAB — PREGNANCY, URINE: Preg Test, Ur: NEGATIVE

## 2022-09-24 MED ORDER — MIDAZOLAM HCL 2 MG/2ML IJ SOLN
INTRAMUSCULAR | Status: AC
  Filled 2022-09-24: qty 4

## 2022-09-24 MED ORDER — FENTANYL CITRATE (PF) 100 MCG/2ML IJ SOLN
INTRAMUSCULAR | Status: AC | PRN
  Administered 2022-09-24 (×2): 50 ug via INTRAVENOUS
  Administered 2022-09-24: 25 ug via INTRAVENOUS

## 2022-09-24 MED ORDER — MIDAZOLAM HCL 2 MG/2ML IJ SOLN
INTRAMUSCULAR | Status: AC | PRN
  Administered 2022-09-24 (×2): 1 mg via INTRAVENOUS
  Administered 2022-09-24: .5 mg via INTRAVENOUS

## 2022-09-24 MED ORDER — FENTANYL CITRATE (PF) 100 MCG/2ML IJ SOLN
INTRAMUSCULAR | Status: AC
  Filled 2022-09-24: qty 4

## 2022-09-24 MED ORDER — DIPHENHYDRAMINE HCL 50 MG/ML IJ SOLN
INTRAMUSCULAR | Status: AC
  Filled 2022-09-24: qty 1

## 2022-09-24 MED ORDER — ONDANSETRON HCL 4 MG/2ML IJ SOLN
INTRAMUSCULAR | Status: AC
  Filled 2022-09-24: qty 2

## 2022-09-24 MED ORDER — DIPHENHYDRAMINE HCL 50 MG/ML IJ SOLN
INTRAMUSCULAR | Status: AC | PRN
  Administered 2022-09-24: 50 mg via INTRAVENOUS

## 2022-09-24 MED ORDER — ONDANSETRON HCL 4 MG/2ML IJ SOLN
INTRAMUSCULAR | Status: AC | PRN
  Administered 2022-09-24: 4 mg via INTRAVENOUS

## 2022-09-24 NOTE — Procedures (Signed)
Pre procedural Dx: Urinary retention Post procedural Dx: Same  Technically successful CT guided placed of a 16 Fr drainage catheter placement into the urinary bladder yielding return of clear urine.   Suprapubic catheter connected to foley bag.  EBL: Trace Complications: None immediate  Katherina Right, MD Pager #: [phone]

## 2022-10-02 ENCOUNTER — Other Ambulatory Visit (HOSPITAL_COMMUNITY): Payer: Self-pay | Admitting: Interventional Radiology

## 2022-10-02 DIAGNOSIS — R339 Retention of urine, unspecified: Secondary | ICD-10-CM

## 2022-10-03 ENCOUNTER — Other Ambulatory Visit: Payer: Self-pay

## 2022-10-03 ENCOUNTER — Encounter (HOSPITAL_BASED_OUTPATIENT_CLINIC_OR_DEPARTMENT_OTHER): Payer: Self-pay | Admitting: Emergency Medicine

## 2022-10-03 ENCOUNTER — Emergency Department (HOSPITAL_BASED_OUTPATIENT_CLINIC_OR_DEPARTMENT_OTHER): Payer: PRIVATE HEALTH INSURANCE

## 2022-10-03 ENCOUNTER — Emergency Department (HOSPITAL_BASED_OUTPATIENT_CLINIC_OR_DEPARTMENT_OTHER)
Admission: EM | Admit: 2022-10-03 | Discharge: 2022-10-03 | Disposition: A | Discharge status: Home / Self Care | Payer: PRIVATE HEALTH INSURANCE | Attending: Emergency Medicine | Admitting: Emergency Medicine

## 2022-10-03 DIAGNOSIS — Z794 Long term (current) use of insulin: Secondary | ICD-10-CM | POA: Diagnosis not present

## 2022-10-03 DIAGNOSIS — Z7982 Long term (current) use of aspirin: Secondary | ICD-10-CM | POA: Insufficient documentation

## 2022-10-03 DIAGNOSIS — R110 Nausea: Secondary | ICD-10-CM | POA: Insufficient documentation

## 2022-10-03 DIAGNOSIS — R238 Other skin changes: Secondary | ICD-10-CM | POA: Insufficient documentation

## 2022-10-03 DIAGNOSIS — R1011 Right upper quadrant pain: Secondary | ICD-10-CM | POA: Insufficient documentation

## 2022-10-03 LAB — URINALYSIS, ROUTINE W REFLEX MICROSCOPIC
Bacteria, UA: NONE SEEN
Bilirubin Urine: NEGATIVE
Glucose, UA: NEGATIVE mg/dL
Ketones, ur: NEGATIVE mg/dL
Leukocytes,Ua: NEGATIVE
Nitrite: NEGATIVE
Protein, ur: NEGATIVE mg/dL
Specific Gravity, Urine: <1.005 — ABNORMAL LOW (ref 1.005–1.030)
pH: 7.5 (ref 5.0–8.0)

## 2022-10-03 LAB — CBC
HCT: 42.1 % (ref 36.0–46.0)
Hemoglobin: 13.3 g/dL (ref 12.0–15.0)
MCH: 25.9 pg — ABNORMAL LOW (ref 26.0–34.0)
MCHC: 31.6 g/dL (ref 30.0–36.0)
MCV: 82.1 fL (ref 80.0–100.0)
Platelets: 167 10*3/uL (ref 150–400)
RBC: 5.13 MIL/uL — ABNORMAL HIGH (ref 3.87–5.11)
RDW: 19.4 % — ABNORMAL HIGH (ref 11.5–15.5)
WBC: 6.6 10*3/uL (ref 4.0–10.5)
nRBC: 0 % (ref 0.0–0.2)

## 2022-10-03 LAB — COMPREHENSIVE METABOLIC PANEL
ALT: 13 U/L (ref 0–44)
AST: 17 U/L (ref 15–41)
Albumin: 4.4 g/dL (ref 3.5–5.0)
Alkaline Phosphatase: 48 U/L (ref 38–126)
Anion gap: 12 (ref 5–15)
BUN: 14 mg/dL (ref 6–20)
CO2: 25 mmol/L (ref 22–32)
Calcium: 9.3 mg/dL (ref 8.9–10.3)
Chloride: 103 mmol/L (ref 98–111)
Creatinine, Ser: 1.31 mg/dL — ABNORMAL HIGH (ref 0.44–1.00)
GFR, Estimated: 54 mL/min — ABNORMAL LOW (ref >60)
Glucose, Bld: 115 mg/dL — ABNORMAL HIGH (ref 70–99)
Potassium: 3.7 mmol/L (ref 3.5–5.1)
Sodium: 140 mmol/L (ref 135–145)
Total Bilirubin: 1.4 mg/dL — ABNORMAL HIGH (ref 0.3–1.2)
Total Protein: 6.9 g/dL (ref 6.5–8.1)

## 2022-10-03 LAB — LIPASE, BLOOD: Lipase: 36 U/L (ref 11–51)

## 2022-10-03 LAB — PREGNANCY, URINE: Preg Test, Ur: NEGATIVE

## 2022-10-03 MED ORDER — SODIUM CHLORIDE 0.9 % IV BOLUS
500.0000 mL | Freq: Once | INTRAVENOUS | Status: AC
  Administered 2022-10-03: 500 mL via INTRAVENOUS

## 2022-10-03 MED ORDER — MORPHINE SULFATE (PF) 4 MG/ML IV SOLN
4.0000 mg | Freq: Once | INTRAVENOUS | Status: AC
  Administered 2022-10-03: 4 mg via INTRAVENOUS
  Filled 2022-10-03: qty 1

## 2022-10-03 MED ORDER — IOHEXOL 300 MG/ML  SOLN
100.0000 mL | Freq: Once | INTRAMUSCULAR | Status: AC | PRN
  Administered 2022-10-03: 75 mL via INTRAVENOUS

## 2022-10-03 MED ORDER — FENTANYL CITRATE PF 50 MCG/ML IJ SOSY
50.0000 ug | PREFILLED_SYRINGE | Freq: Once | INTRAMUSCULAR | Status: AC
  Administered 2022-10-03: 50 ug via INTRAVENOUS
  Filled 2022-10-03: qty 1

## 2022-10-03 MED ORDER — ONDANSETRON HCL 4 MG/2ML IJ SOLN
4.0000 mg | Freq: Once | INTRAMUSCULAR | Status: AC
  Administered 2022-10-03: 4 mg via INTRAVENOUS
  Filled 2022-10-03: qty 2

## 2022-10-03 MED ORDER — HYDROMORPHONE HCL 1 MG/ML IJ SOLN
1.0000 mg | Freq: Once | INTRAMUSCULAR | Status: AC
  Administered 2022-10-03: 1 mg via INTRAVENOUS
  Filled 2022-10-03: qty 1

## 2022-10-03 NOTE — ED Notes (Signed)
Called [facility]/Dr Krisitin Head for consult with PA-Smoot.-ABB(NS)

## 2022-10-03 NOTE — ED Notes (Signed)
Discharge paperwork given and verbally understood. 

## 2022-10-03 NOTE — Discharge Instructions (Addendum)
As we discussed, your workup in the ER today was reassuring for acute findings.  Laboratory evaluation, ultrasound imaging and CT imaging did not reveal any emergent cause of your symptoms.  I have reached out to your transplant team who recommend that you follow-up closely outpatient with them.  Return if development of any new or worsening symptoms.

## 2022-10-03 NOTE — ED Notes (Signed)
Provider requested a fluid challenge... Pt was able to drink and keep down the fluids without feeling N/V... Provider notified.Marland KitchenMarland Kitchen

## 2022-10-03 NOTE — ED Triage Notes (Signed)
Pt arrives to ED with c/o RUQ abdominal pain x4 days. Hx liver transplant and had suprapubic catheter placed 5/6.

## 2022-10-03 NOTE — ED Provider Notes (Signed)
[place] EMERGENCY DEPARTMENT AT [facility] Provider Note   CSN: [id number] Arrival date & time: 10/03/22  1236     History  Chief Complaint  Patient presents with   Abdominal Pain    Patricia Lutz is a 37 y.o. female.  Patient with history of liver transplant at [facility] in 2022, suprapubic catheter placement on 09/24/22, Addison's disease presents today with complaints of RUQ abdominal pain. She states that same began originally 4 days ago and has been persistent since. She endorses associated nausea without vomiting, denies diarrhea. She originally thought pain could be due to constipation, however she took a stool softener and had a large bowel movement with no improvement of pain. She also thought the pain could be related to her suprapubic catheter she had placed, however she saw her urologist yesterday who told her it did not appear to be a catheter issue and recommended she follow-up with her transplant team. She states that she did not have time or child care to be able to go to [facility] today and presents here for her symptoms. She states that her pain feels like when she had her transplant. She has been taking her home oxycodone without relief. Denies fevers or chills.   The history is provided by the patient. No language interpreter was used.  Abdominal Pain Associated symptoms: nausea        Home Medications Prior to Admission medications   Medication Sig Start Date End Date Taking? Authorizing Provider  alprazolam Prudy Feeler) 2 MG tablet Take 2 mg by mouth 3 (three) times daily as needed for anxiety. 12/14/20   [provider]  ASPIRIN LOW DOSE 81 MG EC tablet Take 81 mg by mouth daily. 12/31/20   [provider]  BELBUCA 750 MCG FILM Take 750 mcg by mouth every 12 (twelve) hours.    [provider]  Calcium Citrate-Vitamin D3 315-6.25 MG-MCG TABS Take 2 tablets by mouth 2 (two) times daily at 10 AM and 5 PM. 09/28/19   [provider]   Continuous Blood Gluc Sensor (DEXCOM G7 SENSOR) MISC USE 1 EACH EVERY 10 DAYS    [provider]  cycloSPORINE modified (NEORAL) 25 MG capsule Take 50 mg by mouth in the morning and at bedtime. 12/16/20   [provider]  famotidine (PEPCID) 40 MG tablet Take 40 mg by mouth at bedtime.    [provider]  furosemide (LASIX) 20 MG tablet Take 40 mg by mouth daily.    [provider]  hydrocortisone (CORTEF) 5 MG tablet Take 10-20 mg by mouth See admin instructions. Take 20 mg by mouth in the morning and then take 10 mg by mouth at 3pm per patient 11/17/21   [provider]  lansoprazole (PREVACID) 15 MG capsule Take 30 mg by mouth 2 (two) times daily before a meal.    [provider]  levothyroxine (SYNTHROID) 137 MCG tablet Take 137 mcg by mouth daily before breakfast.    [provider]  magnesium oxide (MAG-OX) 400 MG tablet Take 2 tablets by mouth 2 (two) times daily. 11/01/20   [provider]  mupirocin ointment (BACTROBAN) 2 % Apply 1 Application topically daily. 08/01/22   [provider]  NOVOLOG FLEXPEN 100 UNIT/ML FlexPen Inject 0-10 Units into the skin 3 (three) times daily with meals. Sliding scale    [provider]  ondansetron (ZOFRAN-ODT) 4 MG disintegrating tablet Take 4 mg by mouth every 8 (eight) hours as needed for nausea or  vomiting.  04/30/19   [provider]  VEMLIDY 25 MG TABS Take 1 tablet by mouth daily. 11/30/20   [provider]  zolpidem (AMBIEN) 10 MG tablet Take 10 mg by mouth at bedtime as needed for sleep. 01/01/21   [provider]      Allergies    Ibuprofen    Review of Systems   Review of Systems  Gastrointestinal:  Positive for abdominal pain and nausea.  All other systems reviewed and are negative.   Physical Exam Updated Vital Signs BP 115/74 (BP Location: Left Arm)   Pulse (!) 55   Temp 98.2 F (36.8 C) (Oral) Comment: Pt had been  drinking water  Resp (!) 56   Ht 5\' 2"  (1.575 m)   Wt 75.1 kg   SpO2 98%   BMI 30.29 kg/m  Physical Exam Vitals and nursing note reviewed.  Constitutional:      General: She is not in acute distress.    Appearance: Normal appearance. She is normal weight. She is not ill-appearing, toxic-appearing or diaphoretic.  HENT:     Head: Normocephalic and atraumatic.  Cardiovascular:     Rate and Rhythm: Normal rate.  Pulmonary:     Effort: Pulmonary effort is normal. No respiratory distress.  Abdominal:     General: Abdomen is flat.     Palpations: Abdomen is soft.     Tenderness: There is abdominal tenderness in the right upper quadrant.     Comments: Suprapubic catheter in place, clear urine in bag. No surrounding erythema or purulence. No tenderness in this area.   Chronic skin changes noted to the abdominal wall which patient states are unchanged.  Musculoskeletal:        General: Normal range of motion.     Cervical back: Normal range of motion.  Skin:    General: Skin is warm and dry.  Neurological:     General: No focal deficit present.     Mental Status: She is alert.  Psychiatric:        Mood and Affect: Mood normal.        Behavior: Behavior normal.     ED Results / Procedures / Treatments   Labs (all labs ordered are listed, but only abnormal results are displayed) Labs Reviewed  COMPREHENSIVE METABOLIC PANEL - Abnormal; Notable for the following components:      Result Value   Glucose, Bld 115 (*)    Creatinine, Ser 1.31 (*)    Total Bilirubin 1.4 (*)    GFR, Estimated 54 (*)    All other components within normal limits  CBC - Abnormal; Notable for the following components:   RBC 5.13 (*)    MCH 25.9 (*)    RDW 19.4 (*)    All other components within normal limits  URINALYSIS, ROUTINE W REFLEX MICROSCOPIC - Abnormal; Notable for the following components:   Color, Urine COLORLESS (*)    Specific Gravity, Urine <1.005 (*)    Hgb urine dipstick MODERATE (*)     All other components within normal limits  LIPASE, BLOOD  PREGNANCY, URINE    EKG None  Radiology US Abdomen Limited RUQ (LIVER/GB)  Result Date: 10/03/2022 CLINICAL DATA:  Right upper quadrant pain. History of prior liver transplant EXAM: ULTRASOUND ABDOMEN LIMITED RIGHT UPPER QUADRANT COMPARISON:  Ultrasound January 2021.  CT scan earlier 10/03/2022. FINDINGS: Gallbladder: Surgically absent Common bile duct: Diameter: 5 mm Liver: Slightly heterogeneous and echogenic liver. Please correlate with the history of  liver transplant. Portal vein is patent on color Doppler imaging with normal direction of blood flow towards the liver. Other: None. IMPRESSION: Previous liver transplant. Prior cholecystectomy. No biliary ductal dilatation. Electronically Signed   By: Karen Kays M.D.   On: 10/03/2022 17:11   CT ABDOMEN PELVIS W CONTRAST  Result Date: 10/03/2022 CLINICAL DATA:  Acute abdominal pain worse in the right upper quadrant. Status post liver transplant 2021. Recent CT-guided suprapubic catheter placement 09/24/2022. EXAM: CT ABDOMEN AND PELVIS WITH CONTRAST TECHNIQUE: Multidetector CT imaging of the abdomen and pelvis was performed using the standard protocol following bolus administration of intravenous contrast. RADIATION DOSE REDUCTION: This exam was performed according to the departmental dose-optimization program which includes automated exposure control, adjustment of the mA and/or kV according to patient size and/or use of iterative reconstruction technique. CONTRAST:  75mL OMNIPAQUE IOHEXOL 300 MG/ML  SOLN COMPARISON:  08/16/2022, 09/24/2022 FINDINGS: Lower chest: No acute abnormality. Hepatobiliary: Stable small left hepatic dome hypodense cyst. Similar diffuse intra and extrahepatic biliary prominence. Postop changes from liver transplant. Hepatic and portal veins are patent. No large focal hepatic abnormality, surrounding free fluid, or interval change. Pancreas: Unremarkable. No  pancreatic ductal dilatation or surrounding inflammatory changes. Spleen: Normal in size without focal abnormality. Adrenals/Urinary Tract: Normal adrenal glands and kidneys for age. No renal obstruction pattern, hydronephrosis, or nephrolithiasis. Ureters are symmetric and decompressed. Interval midline suprapubic catheter placement. Bladder is completely collapsed by the SP catheter when compared to the prior study. Tiny focus of free air in the right anterior pelvis at the level of the SP tube, image 71/2 suspect related to recent CT-guided placement of the SP tube on 09/24/2022. Stomach/Bowel: Negative for bowel obstruction, significant dilatation, ileus, or free air. Cecum position more the midline at the umbilical level. Normal appearing appendix visualized in the midline as well. No free fluid, fluid collection, hemorrhage, hematoma, abscess or ascites. Vascular/Lymphatic: Intact aorta. Mesenteric and renal vasculature all appear patent. Similar gastric varices and mesenteric/retroperitoneal varices. No veno-occlusive process. No bulky adenopathy. Reproductive: IUD in the midline of the uterus. Ovaries and adnexa unremarkable. No pelvic free fluid. Other: No abdominal wall hernia or abnormality. No abdominopelvic ascites. Musculoskeletal: No acute or significant osseous findings. IMPRESSION: 1. No acute intra-abdominal or pelvic finding by CT. 2. Stable postoperative findings from liver transplant. 3. Interval suprapubic catheter placement with a tiny focus of free air in the right anterior pelvis at the level of the SP tube suspect related to recent CT-guided placement of the SP tube on 09/24/2022. Electronically Signed   By: Judie Petit.  Shick M.D.   On: 10/03/2022 15:02    Procedures Procedures    Medications Ordered in ED Medications  fentaNYL (SUBLIMAZE) injection 50 mcg (has no administration in time range)  morphine (PF) 4 MG/ML injection 4 mg (4 mg Intravenous Given 10/03/22 1344)  ondansetron  (ZOFRAN) injection 4 mg (4 mg Intravenous Given 10/03/22 1341)  sodium chloride 0.9 % bolus 500 mL (0 mLs Intravenous Stopped 10/03/22 1435)  HYDROmorphone (DILAUDID) injection 1 mg (1 mg Intravenous Given 10/03/22 1420)  iohexol (OMNIPAQUE) 300 MG/ML solution 100 mL (75 mLs Intravenous Contrast Given 10/03/22 1429)    ED Course/ Medical Decision Making/ A&P                             Medical Decision Making Amount and/or Complexity of Data Reviewed Labs: ordered. Radiology: ordered.  Risk Prescription drug management.   This patient is  a 37 y.o. female who presents to the ED for concern of abdominal pain, this involves an extensive number of treatment options, and is a complaint that carries with it a high risk of complications and morbidity. The emergent differential diagnosis prior to evaluation includes, but is not limited to,  transplant complication, post op problem, AAA, gastroenteritis, appendicitis, Bowel obstruction, Bowel perforation. Gastroparesis, DKA, Hernia, Inflammatory bowel disease, mesenteric ischemia, pancreatitis, peritonitis SBP, volvulus.  This is not an exhaustive differential.   Past Medical History / Co-morbidities / Social History: history of liver transplant at [facility] in 2022, suprapubic catheter placement on 09/24/22, Addison's disease  Physical Exam: Physical exam performed. The pertinent findings include: per above, RUQ TTP  Lab Tests: I ordered, and personally interpreted labs.  The pertinent results include:  no leukocytosis, kidney function at baseline, T bili 1.4, UA noninfectious   Imaging Studies: I ordered imaging studies including RUQ [place], CT abdomen pelvis. I independently visualized and interpreted imaging which showed   CT:   1. No acute intra-abdominal or pelvic finding by CT. 2. Stable postoperative findings from liver transplant. 3. Interval suprapubic catheter placement with a tiny focus of free air in the right anterior pelvis at the  level of the SP tube suspect related to recent CT-guided placement of the SP tube on 09/24/2022.  [place]:  Previous liver transplant. Prior cholecystectomy. No biliary ductal dilatation.  I agree with the radiologist interpretation.   Medications: I ordered medication including morphine, fentanyl, dilaudid, fluids, zofran  for nausea and pain. Reevaluation of the patient after these medicines showed that the patient improved. I have reviewed the patients home medicines and have made adjustments as needed.  Consultations Obtained: I requested consultation with the Dr. Audrie Lia,  and discussed lab and imaging findings as well as pertinent plan - they recommend: fluid challenge, if no nausea or vomiting, d/c with close follow-up. Patient has a follow-up appointment with her hepatologist on 5/22, Dr. Brooke Dare will reach out to her and try to facilitate an earlier appointment. No additional work-up indicated.    Disposition: After consideration of the diagnostic results and the patients response to treatment, I feel that emergency department workup does not suggest an emergent condition requiring admission or immediate intervention beyond what has been performed at this time. The plan is: discharge with close outpatient follow-up. Patient able to eat and drink without any additional nausea or vomiting.  Patient is nontoxic, nonseptic appearing, in no apparent distress.  Patient's pain and other symptoms adequately managed in emergency department.  Fluid bolus given.  Labs, imaging and vitals reviewed.  Patient does not meet the SIRS or Sepsis criteria.  On repeat exam patient does not have a surgical abdomin and there are no peritoneal signs.  No indication of appendicitis, bowel obstruction, bowel perforation, cholecystitis, diverticulitis.  Evaluation and diagnostic testing in the emergency department does not suggest an emergent condition requiring admission or immediate intervention beyond what has been  performed at this time.  Plan for discharge with close PCP follow-up.  Patient is understanding and amenable with plan, educated on red flag symptoms that would prompt immediate return.  Patient discharged in stable condition.   I discussed this case with my attending physician Dr. Fredderick Phenix who cosigned this note including patient's presenting symptoms, physical exam, and planned diagnostics and interventions. Attending physician stated agreement with plan or made changes to plan which were implemented.     Final Clinical Impression(s) / ED Diagnoses Final diagnoses:  Right upper  quadrant abdominal pain    Rx / DC Orders ED Discharge Orders     None     An After Visit Summary was printed and given to the patient.     Vear Clock 10/03/22 1914    Rolan Bucco, MD 10/03/22 2251

## 2022-10-26 ENCOUNTER — Other Ambulatory Visit: Payer: Self-pay | Admitting: Internal Medicine

## 2022-10-26 DIAGNOSIS — Z978 Presence of other specified devices: Secondary | ICD-10-CM

## 2022-10-28 ENCOUNTER — Emergency Department (HOSPITAL_COMMUNITY)
Admission: EM | Admit: 2022-10-28 | Discharge: 2022-10-29 | Disposition: A | Discharge status: Home / Self Care | Payer: 59 | Attending: Emergency Medicine | Admitting: Emergency Medicine

## 2022-10-28 ENCOUNTER — Other Ambulatory Visit: Payer: Self-pay

## 2022-10-28 DIAGNOSIS — Z944 Liver transplant status: Secondary | ICD-10-CM | POA: Insufficient documentation

## 2022-10-28 DIAGNOSIS — R109 Unspecified abdominal pain: Secondary | ICD-10-CM | POA: Diagnosis not present

## 2022-10-28 DIAGNOSIS — Z794 Long term (current) use of insulin: Secondary | ICD-10-CM | POA: Insufficient documentation

## 2022-10-28 DIAGNOSIS — R339 Retention of urine, unspecified: Secondary | ICD-10-CM | POA: Diagnosis not present

## 2022-10-28 DIAGNOSIS — Z7982 Long term (current) use of aspirin: Secondary | ICD-10-CM | POA: Diagnosis not present

## 2022-10-28 DIAGNOSIS — T83010A Breakdown (mechanical) of cystostomy catheter, initial encounter: Secondary | ICD-10-CM

## 2022-10-28 DIAGNOSIS — Y738 Miscellaneous gastroenterology and urology devices associated with adverse incidents, not elsewhere classified: Secondary | ICD-10-CM | POA: Insufficient documentation

## 2022-10-28 DIAGNOSIS — T83098A Other mechanical complication of other indwelling urethral catheter, initial encounter: Secondary | ICD-10-CM | POA: Diagnosis present

## 2022-10-28 DIAGNOSIS — Z435 Encounter for attention to cystostomy: Secondary | ICD-10-CM | POA: Insufficient documentation

## 2022-10-28 NOTE — ED Triage Notes (Signed)
Patient coming to ED for evaluation of lower abdominal pain.  Has suprapubic catheter placed on 5/6.  States she has a planned procedure to have catheter evaluated in morning due to possible infection.  Hx of liver transplant.  Reports abdominal distention and decreased urine output.  Has noticed urine coming from urethra and "green/yellow" discharge from site of suprapubic catheter.

## 2022-10-28 NOTE — ED Provider Notes (Signed)
[place] EMERGENCY DEPARTMENT AT [facility] Provider Note   CSN: [id number] Arrival date & time: 10/28/22  2259     History  Chief Complaint  Patient presents with   Abdominal Pain    Patricia Lutz is a 37 y.o. female.  The history is provided by the patient.  Abdominal Pain Patricia Lutz is a 37 y.o. female who presents to the Emergency Department complaining of catheter problem.  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  She is on chronic immunosuppression.  On May 6 she had a suprapubic catheter placed by interventional radiology and is scheduled to have the catheter replaced on Monday morning.  She presents to the emergency department today due to leakage of urine around the catheter site as well as increasing abdominal pain and now occasionally voiding through her urethra.  She does have pain on voiding through her urethra.  No fever but she does not feel well and reports occasional chills.  She also reports abdominal discomfort and bloating.  Of note she was recently admitted to [facility] and discharged early June for acute on chronic abdominal pain.    Home Medications Prior to Admission medications   Medication Sig Start Date End Date Taking? Authorizing Provider  alprazolam Prudy Feeler) 2 MG tablet Take 2 mg by mouth 3 (three) times daily as needed for anxiety. 12/14/20   [provider]  ASPIRIN LOW DOSE 81 MG EC tablet Take 81 mg by mouth daily. 12/31/20   [provider]  BELBUCA 750 MCG FILM Take 750 mcg by mouth every 12 (twelve) hours.    [provider]  Calcium Citrate-Vitamin D3 315-6.25 MG-MCG TABS Take 2 tablets by mouth 2 (two) times daily at 10 AM and 5 PM. 09/28/19   [provider]  Continuous Blood Gluc Sensor (DEXCOM G7 SENSOR) MISC USE 1 EACH EVERY 10 DAYS    [provider]  cycloSPORINE modified (NEORAL) 25 MG capsule Take 50 mg by mouth in the morning and at bedtime. 12/16/20    [provider]  famotidine (PEPCID) 40 MG tablet Take 40 mg by mouth at bedtime.    [provider]  furosemide (LASIX) 20 MG tablet Take 40 mg by mouth daily.    [provider]  hydrocortisone (CORTEF) 5 MG tablet Take 10-20 mg by mouth See admin instructions. Take 20 mg by mouth in the morning and then take 10 mg by mouth at 3pm per patient 11/17/21   [provider]  lansoprazole (PREVACID) 15 MG capsule Take 30 mg by mouth 2 (two) times daily before a meal.    [provider]  levothyroxine (SYNTHROID) 137 MCG tablet Take 137 mcg by mouth daily before breakfast.    [provider]  magnesium oxide (MAG-OX) 400 MG tablet Take 2 tablets by mouth 2 (two) times daily. 11/01/20   [provider]  mupirocin ointment (BACTROBAN) 2 % Apply 1 Application topically daily. 08/01/22   [provider]  NOVOLOG FLEXPEN 100 UNIT/ML FlexPen Inject 0-10 Units into the skin 3 (three) times daily with meals. Sliding scale    [provider]  ondansetron (ZOFRAN-ODT) 4 MG disintegrating tablet Take 4 mg by mouth every 8 (eight) hours as needed for nausea or vomiting.  04/30/19   [provider]  VEMLIDY 25 MG TABS Take 1 tablet by mouth daily. 11/30/20   [provider]  zolpidem (AMBIEN) 10 MG tablet Take 10 mg by mouth  at bedtime as needed for sleep. 01/01/21   [provider]      Allergies    Ibuprofen    Review of Systems   Review of Systems  Gastrointestinal:  Positive for abdominal pain.  All other systems reviewed and are negative.   Physical Exam Updated Vital Signs BP 112/83   Pulse (!) 55   Temp 98.6 F (37 C) (Oral)   Resp 17   SpO2 98%  Physical Exam Vitals and nursing note reviewed.  Constitutional:      Appearance: She is well-developed.  HENT:     Head: Normocephalic and atraumatic.  Cardiovascular:     Rate and Rhythm: Normal rate and regular rhythm.     Heart sounds: No  murmur heard. Pulmonary:     Effort: Pulmonary effort is normal. No respiratory distress.     Breath sounds: Normal breath sounds.  Abdominal:     Palpations: Abdomen is soft.     Tenderness: There is no guarding or rebound.     Comments: Mild generalized abdominal tenderness.  There is a 16 French suprapubic catheter in place with scant amount of local appropriate erythema  Genitourinary:    Comments: Pale yellow urine in catheter bag Musculoskeletal:        General: No tenderness.  Skin:    General: Skin is warm and dry.  Neurological:     Mental Status: She is alert and oriented to person, place, and time.  Psychiatric:        Behavior: Behavior normal.     ED Results / Procedures / Treatments   Labs (all labs ordered are listed, but only abnormal results are displayed) Labs Reviewed  LIPASE, BLOOD - Abnormal; Notable for the following components:      Result Value   Lipase 54 (*)    All other components within normal limits  COMPREHENSIVE METABOLIC PANEL - Abnormal; Notable for the following components:   Total Protein 6.2 (*)    Albumin 3.4 (*)    All other components within normal limits  CBC - Abnormal; Notable for the following components:   Hemoglobin 11.7 (*)    RDW 20.0 (*)    All other components within normal limits  URINALYSIS, ROUTINE W REFLEX MICROSCOPIC - Abnormal; Notable for the following components:   Color, Urine STRAW (*)    Specific Gravity, Urine 1.002 (*)    Hgb urine dipstick MODERATE (*)    Nitrite POSITIVE (*)    Leukocytes,Ua SMALL (*)    Bacteria, UA FEW (*)    All other components within normal limits  PREGNANCY, URINE    EKG None  Radiology No results found.  Procedures Procedures    Medications Ordered in ED Medications  oxyCODONE (Oxy IR/ROXICODONE) immediate release tablet 10 mg (10 mg Oral Given 10/29/22 0351)  fentaNYL (SUBLIMAZE) injection 50 mcg (50 mcg Intravenous Given 10/29/22 0156)    ED Course/ Medical Decision  Making/ A&P                             Medical Decision Making Amount and/or Complexity of Data Reviewed Labs: ordered.  Risk Prescription drug management.   Patient with history of urinary retention status post suprapubic catheter placement on May 6, liver transplant on chronic immunosuppression here for evaluation of increasing abdominal pain in setting of overflow around her catheter.  She is nontoxic-appearing on evaluation.  She has no peritoneal findings on exam.  Her catheter appears to be well-positioned on examination with no surrounding evidence of infection.  Bladder scan did demonstrate 268 cc in her bladder.  There was a kink in the catheter tubing and after this was resolved the catheter was draining well.  Suspect that this catheter has been intermittently kinking, which resulted in her retention earlier.  UA is not consistent with UTI, would not send culture or treat at this time.  Labs are near her baseline.  She was treated with medications for her pain.  Plan to discharge patient so she can follow-up with IR later this morning for catheter exchange.        Final Clinical Impression(s) / ED Diagnoses Final diagnoses:  Suprapubic catheter dysfunction, initial encounter [facility])    Rx / DC Orders ED Discharge Orders     None         Tilden Fossa, MD 10/29/22 0559

## 2022-10-29 ENCOUNTER — Ambulatory Visit (HOSPITAL_COMMUNITY)
Admission: RE | Admit: 2022-10-29 | Discharge: 2022-10-29 | Disposition: A | Discharge status: Home / Self Care | Payer: 59 | Source: Ambulatory Visit | Attending: Interventional Radiology | Admitting: Interventional Radiology

## 2022-10-29 ENCOUNTER — Other Ambulatory Visit: Payer: Self-pay

## 2022-10-29 ENCOUNTER — Encounter (HOSPITAL_COMMUNITY): Payer: Self-pay

## 2022-10-29 DIAGNOSIS — Z944 Liver transplant status: Secondary | ICD-10-CM | POA: Insufficient documentation

## 2022-10-29 DIAGNOSIS — R339 Retention of urine, unspecified: Secondary | ICD-10-CM | POA: Insufficient documentation

## 2022-10-29 DIAGNOSIS — K7460 Unspecified cirrhosis of liver: Secondary | ICD-10-CM | POA: Insufficient documentation

## 2022-10-29 DIAGNOSIS — Z435 Encounter for attention to cystostomy: Secondary | ICD-10-CM | POA: Insufficient documentation

## 2022-10-29 DIAGNOSIS — Z978 Presence of other specified devices: Secondary | ICD-10-CM

## 2022-10-29 HISTORY — PX: IR CATHETER TUBE CHANGE: IMG717

## 2022-10-29 LAB — GLUCOSE, CAPILLARY
Glucose-Capillary: 76 mg/dL (ref 70–99)
Glucose-Capillary: 129 mg/dL — ABNORMAL HIGH (ref 70–99)

## 2022-10-29 LAB — URINALYSIS, ROUTINE W REFLEX MICROSCOPIC
Bilirubin Urine: NEGATIVE
Glucose, UA: NEGATIVE mg/dL
Ketones, ur: NEGATIVE mg/dL
Nitrite: POSITIVE — AB
Protein, ur: NEGATIVE mg/dL
Specific Gravity, Urine: 1.002 — ABNORMAL LOW (ref 1.005–1.030)
pH: 7 (ref 5.0–8.0)

## 2022-10-29 LAB — CBC
HCT: 36.4 % (ref 36.0–46.0)
Hemoglobin: 11.7 g/dL — ABNORMAL LOW (ref 12.0–15.0)
MCH: 27.6 pg (ref 26.0–34.0)
MCHC: 32.1 g/dL (ref 30.0–36.0)
MCV: 85.8 fL (ref 80.0–100.0)
Platelets: 200 10*3/uL (ref 150–400)
RBC: 4.24 MIL/uL (ref 3.87–5.11)
RDW: 20 % — ABNORMAL HIGH (ref 11.5–15.5)
WBC: 10 10*3/uL (ref 4.0–10.5)
nRBC: 0 % (ref 0.0–0.2)

## 2022-10-29 LAB — COMPREHENSIVE METABOLIC PANEL
ALT: 20 U/L (ref 0–44)
AST: 19 U/L (ref 15–41)
Albumin: 3.4 g/dL — ABNORMAL LOW (ref 3.5–5.0)
Alkaline Phosphatase: 52 U/L (ref 38–126)
Anion gap: 9 (ref 5–15)
BUN: 14 mg/dL (ref 6–20)
CO2: 26 mmol/L (ref 22–32)
Calcium: 8.9 mg/dL (ref 8.9–10.3)
Chloride: 100 mmol/L (ref 98–111)
Creatinine, Ser: 0.93 mg/dL (ref 0.44–1.00)
GFR, Estimated: >60 mL/min (ref >60)
Glucose, Bld: 72 mg/dL (ref 70–99)
Potassium: 3.6 mmol/L (ref 3.5–5.1)
Sodium: 135 mmol/L (ref 135–145)
Total Bilirubin: 0.9 mg/dL (ref 0.3–1.2)
Total Protein: 6.2 g/dL — ABNORMAL LOW (ref 6.5–8.1)

## 2022-10-29 LAB — LIPASE, BLOOD: Lipase: 54 U/L — ABNORMAL HIGH (ref 11–51)

## 2022-10-29 LAB — CBG MONITORING, ED: Glucose-Capillary: 73 mg/dL (ref 70–99)

## 2022-10-29 LAB — PREGNANCY, URINE: Preg Test, Ur: NEGATIVE

## 2022-10-29 MED ORDER — MIDAZOLAM HCL 2 MG/2ML IJ SOLN
INTRAMUSCULAR | Status: AC | PRN
  Administered 2022-10-29 (×2): 1 mg via INTRAVENOUS

## 2022-10-29 MED ORDER — SODIUM CHLORIDE 0.9 % IV SOLN: INTRAVENOUS | Status: DC

## 2022-10-29 MED ORDER — OXYCODONE HCL 5 MG PO TABS
10.0000 mg | ORAL_TABLET | ORAL | Status: DC | PRN
  Administered 2022-10-29: 10 mg via ORAL
  Filled 2022-10-29: qty 2

## 2022-10-29 MED ORDER — LIDOCAINE HCL (PF) 1 % IJ SOLN: 10.0000 mL | Freq: Once | INTRAMUSCULAR | Status: DC

## 2022-10-29 MED ORDER — LIDOCAINE VISCOUS HCL 2 % MT SOLN
OROMUCOSAL | Status: AC
  Filled 2022-10-29: qty 15

## 2022-10-29 MED ORDER — IOHEXOL 300 MG/ML  SOLN
50.0000 mL | Freq: Once | INTRAMUSCULAR | Status: AC | PRN
  Administered 2022-10-29: 10 mL

## 2022-10-29 MED ORDER — LIDOCAINE-EPINEPHRINE 1 %-1:100000 IJ SOLN
INTRAMUSCULAR | Status: AC
  Filled 2022-10-29: qty 1

## 2022-10-29 MED ORDER — FENTANYL CITRATE (PF) 100 MCG/2ML IJ SOLN
INTRAMUSCULAR | Status: AC | PRN
  Administered 2022-10-29: 100 ug via INTRAVENOUS

## 2022-10-29 MED ORDER — MIDAZOLAM HCL 2 MG/2ML IJ SOLN
INTRAMUSCULAR | Status: AC
  Filled 2022-10-29: qty 2

## 2022-10-29 MED ORDER — FENTANYL CITRATE (PF) 100 MCG/2ML IJ SOLN
INTRAMUSCULAR | Status: AC
  Filled 2022-10-29: qty 2

## 2022-10-29 MED ORDER — FENTANYL CITRATE PF 50 MCG/ML IJ SOSY
50.0000 ug | PREFILLED_SYRINGE | Freq: Once | INTRAMUSCULAR | Status: AC
  Administered 2022-10-29: 50 ug via INTRAVENOUS
  Filled 2022-10-29: qty 1

## 2022-10-29 NOTE — ED Notes (Addendum)
This RN reviewed discharge instructions with patient. She verbalized understanding and denied any further questions. Pt given phone number and instructions for IR appointment. PT well appearing upon discharge and reports tolerable pain. Pt wheeled to exit. Pt endorses ride home.

## 2022-10-29 NOTE — Procedures (Signed)
Interventional Radiology Procedure Note  Procedure:  Image guided exchange of 59F pigtail, for a 59F balloon retention council tip urinary catheter, to leg bag.   Complications: None  Recommendations:  - Routine catheter care - Do not submerge - Education with foley/balloon-retention care - DC 1 hr when goals met  Signed,  Yvone Neu. Loreta Ave, DO

## 2022-10-29 NOTE — H&P (Signed)
Chief Complaint: Patient was seen in consultation today for Supra pubic catheter exchange/upsize at the request of Dr Ranelle Oyster   Supervising Physician: Gilmer Mor  Patient Status: [facility] - Out-pt  History of Present Illness: Patricia Lutz is a 37 y.o. 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 Presented to the ED on 9.25.23 with several months of worsening abdominal bloating.  Found to have urinary retntion. A foley catheter was placed Patient Per urology notes dated 5.6.2024 The patient's liver transplant team at [facility] states that the [facility] will no longer accept female patient with indwelling foley catheters.   IMPRESSION: Procedure 09/24/22 IR Technically successful ultrasound and CT-guided placement of a 16 French suprapubic catheter.  Now scheduled for SP tube exchange/upsize   Past Medical History:  Diagnosis Date   Anxiety    Cirrhosis (HCC)    Depression    Diabetes mellitus without complication (HCC)    GERD (gastroesophageal reflux disease)    Hashimoto's disease    History of stomach ulcers    Hypothyroidism    Substance abuse (HCC)    alcohol    Past Surgical History:  Procedure Laterality Date   CESAREAN SECTION     LIVER TRANSPLANT      Allergies: Ibuprofen  Medications: Prior to Admission medications   Medication Sig Start Date End Date Taking? Authorizing Provider  alprazolam Prudy Feeler) 2 MG tablet Take 2 mg by mouth 3 (three) times daily as needed for anxiety. 12/14/20  Yes [provider]  ASPIRIN LOW DOSE 81 MG EC tablet Take 81 mg by mouth daily. 12/31/20  Yes [provider]  Calcium Citrate-Vitamin D3 315-6.25 MG-MCG TABS Take 2 tablets by mouth 2 (two) times daily at 10 AM and 5 PM. 09/28/19  Yes [provider]  Continuous Blood Gluc Sensor (DEXCOM G7 SENSOR) MISC USE 1 EACH EVERY 10 DAYS   Yes  [provider]  cycloSPORINE modified (NEORAL) 25 MG capsule Take 50 mg by mouth in the morning and at bedtime. 12/16/20  Yes [provider]  famotidine (PEPCID) 40 MG tablet Take 40 mg by mouth at bedtime.   Yes [provider]  furosemide (LASIX) 20 MG tablet Take 40 mg by mouth daily.   Yes [provider]  hydrocortisone (CORTEF) 5 MG tablet Take 10-20 mg by mouth See admin instructions. Take 20 mg by mouth in the morning and then take 10 mg by mouth at 3pm per patient 11/17/21  Yes [provider]  lansoprazole (PREVACID) 15 MG capsule Take 30 mg by mouth 2 (two) times daily before a meal.   Yes [provider]  levothyroxine (SYNTHROID) 137 MCG tablet Take 137 mcg by mouth daily before breakfast.   Yes [provider]  magnesium oxide (MAG-OX) 400 MG tablet Take 2 tablets by mouth 2 (two) times daily. 11/01/20  Yes [provider]  mupirocin ointment (BACTROBAN) 2 % Apply 1 Application topically daily. 08/01/22  Yes [provider]  NOVOLOG FLEXPEN 100 UNIT/ML FlexPen Inject 0-10 Units into the skin 3 (three) times daily with meals. Sliding scale   Yes [provider]  ondansetron (ZOFRAN-ODT) 4 MG disintegrating tablet Take 4 mg by mouth every 8 (eight) hours as needed for nausea or vomiting.  04/30/19  Yes [provider]  VEMLIDY 25 MG TABS Take 1 tablet by mouth  daily. 11/30/20  Yes [provider]  zolpidem (AMBIEN) 10 MG tablet Take 10 mg by mouth at bedtime as needed for sleep. 01/01/21  Yes [provider]  BELBUCA 750 MCG FILM Take 750 mcg by mouth every 12 (twelve) hours.    [provider]     Family History  Problem Relation Age of Onset   Crohn's disease Mother    Crohn's disease Maternal Grandmother    Diabetes Brother     Social History   Socioeconomic History   Marital status: Married    Spouse name: Not on file   Number of children: Not on file    Years of education: Not on file   Highest education level: Not on file  Occupational History   Not on file  Tobacco Use   Smoking status: Never   Smokeless tobacco: Never  Vaping Use   Vaping Use: Never used  Substance and Sexual Activity   Alcohol use: Not Currently    Alcohol/week: 0.0 standard drinks of alcohol   Drug use: No   Sexual activity: Not on file  Other Topics Concern   Not on file  Social History Narrative   Not on file   Social Determinants of Health   Financial Resource Strain: Not on file  Food Insecurity: No Food Insecurity (08/17/2022)   Hunger Vital Sign    Worried About Running Out of Food in the Last Year: Never true    Ran Out of Food in the Last Year: Never true  Transportation Needs: No Transportation Needs (08/17/2022)   PRAPARE - Administrator, Civil Service (Medical): No    Lack of Transportation (Non-Medical): No  Physical Activity: Not on file  Stress: Not on file  Social Connections: Not on file    Review of Systems: A 12 point ROS discussed and pertinent positives are indicated in the HPI above.  All other systems are negative.  Vital Signs: BP 118/88   Pulse 69   Temp 98.3 F (36.8 C) (Oral)   Resp 18   Ht 5\' 1"  (1.549 m)   Wt 159 lb 12.8 oz (72.5 kg)   SpO2 100%   BMI 30.19 kg/m     Physical Exam Vitals reviewed.  HENT:     Mouth/Throat:     Mouth: Mucous membranes are moist.  Cardiovascular:     Rate and Rhythm: Normal rate and regular rhythm.     Heart sounds: Normal heart sounds.  Pulmonary:     Effort: Pulmonary effort is normal.     Breath sounds: Normal breath sounds.  Abdominal:     Palpations: Abdomen is soft.  Musculoskeletal:        General: Normal range of motion.  Skin:    General: Skin is warm.     Comments: Supra pubic catheter in place  Neurological:     Mental Status: She is alert and oriented to person, place, and time.  Psychiatric:        Behavior: Behavior normal.      Imaging: US Abdomen Limited RUQ (LIVER/GB)  Result Date: 10/03/2022 CLINICAL DATA:  Right upper quadrant pain. History of prior liver transplant EXAM: ULTRASOUND ABDOMEN LIMITED RIGHT UPPER QUADRANT COMPARISON:  Ultrasound January 2021.  CT scan earlier 10/03/2022. FINDINGS: Gallbladder: Surgically absent Common bile duct: Diameter: 5 mm Liver: Slightly heterogeneous and echogenic liver. Please correlate with the history of liver transplant. Portal vein is patent on color Doppler imaging with normal direction of blood flow  towards the liver. Other: None. IMPRESSION: Previous liver transplant. Prior cholecystectomy. No biliary ductal dilatation. Electronically Signed   By: Karen Kays M.D.   On: 10/03/2022 17:11   CT ABDOMEN PELVIS W CONTRAST  Result Date: 10/03/2022 CLINICAL DATA:  Acute abdominal pain worse in the right upper quadrant. Status post liver transplant 2021. Recent CT-guided suprapubic catheter placement 09/24/2022. EXAM: CT ABDOMEN AND PELVIS WITH CONTRAST TECHNIQUE: Multidetector CT imaging of the abdomen and pelvis was performed using the standard protocol following bolus administration of intravenous contrast. RADIATION DOSE REDUCTION: This exam was performed according to the departmental dose-optimization program which includes automated exposure control, adjustment of the mA and/or kV according to patient size and/or use of iterative reconstruction technique. CONTRAST:  75mL OMNIPAQUE IOHEXOL 300 MG/ML  SOLN COMPARISON:  08/16/2022, 09/24/2022 FINDINGS: Lower chest: No acute abnormality. Hepatobiliary: Stable small left hepatic dome hypodense cyst. Similar diffuse intra and extrahepatic biliary prominence. Postop changes from liver transplant. Hepatic and portal veins are patent. No large focal hepatic abnormality, surrounding free fluid, or interval change. Pancreas: Unremarkable. No pancreatic ductal dilatation or surrounding inflammatory changes. Spleen: Normal in size without  focal abnormality. Adrenals/Urinary Tract: Normal adrenal glands and kidneys for age. No renal obstruction pattern, hydronephrosis, or nephrolithiasis. Ureters are symmetric and decompressed. Interval midline suprapubic catheter placement. Bladder is completely collapsed by the SP catheter when compared to the prior study. Tiny focus of free air in the right anterior pelvis at the level of the SP tube, image 71/2 suspect related to recent CT-guided placement of the SP tube on 09/24/2022. Stomach/Bowel: Negative for bowel obstruction, significant dilatation, ileus, or free air. Cecum position more the midline at the umbilical level. Normal appearing appendix visualized in the midline as well. No free fluid, fluid collection, hemorrhage, hematoma, abscess or ascites. Vascular/Lymphatic: Intact aorta. Mesenteric and renal vasculature all appear patent. Similar gastric varices and mesenteric/retroperitoneal varices. No veno-occlusive process. No bulky adenopathy. Reproductive: IUD in the midline of the uterus. Ovaries and adnexa unremarkable. No pelvic free fluid. Other: No abdominal wall hernia or abnormality. No abdominopelvic ascites. Musculoskeletal: No acute or significant osseous findings. IMPRESSION: 1. No acute intra-abdominal or pelvic finding by CT. 2. Stable postoperative findings from liver transplant. 3. Interval suprapubic catheter placement with a tiny focus of free air in the right anterior pelvis at the level of the SP tube suspect related to recent CT-guided placement of the SP tube on 09/24/2022. Electronically Signed   By: Judie Petit.  Shick M.D.   On: 10/03/2022 15:02    Labs:  CBC: Recent Labs    08/17/22 0550 09/24/22 0952 10/03/22 1250 10/28/22 2326  WBC 5.0 8.3 6.6 10.0  HGB 10.2* 13.0 13.3 11.7*  HCT 32.3* 39.5 42.1 36.4  PLT 118* 150 167 200    COAGS: Recent Labs    09/24/22 1043  INR 1.1    BMP: Recent Labs    08/16/22 1722 08/17/22 0550 10/03/22 1250 10/28/22 2326  NA  133* 135 140 135  K 4.3 5.1 3.7 3.6  CL 98 102 103 100  CO2 24 25 25 26   GLUCOSE 113* 104* 115* 72  BUN 20 23* 14 14  CALCIUM 9.1 8.1* 9.3 8.9  CREATININE 1.40* 1.33* 1.31* 0.93  GFRNONAA 50* 53* 54* >60    LIVER FUNCTION TESTS: Recent Labs    08/16/22 1722 08/17/22 0550 10/03/22 1250 10/28/22 2326  BILITOT 1.0 1.0 1.4* 0.9  AST 37 39 17 19  ALT 29 25 13 20   ALKPHOS 63  51 48 52  PROT 6.8 5.2* 6.9 6.2*  ALBUMIN 3.8 2.9* 4.4 3.4*    TUMOR MARKERS: No results for input(s): "AFPTM", "CEA", "CA199", "CHROMGRNA" in the last 8760 hours.  Assessment and Plan:  Supra pubic catheter exchange/upsize Pt is aware of procedure benefits and risks including but not limited to Infection; bleeding; organ damage or damage to surrounding structures Agreeable to proceed Consent signed and in chart  Thank you for this interesting consult.  I greatly enjoyed meeting Bellissa Tschida and look forward to participating in their care.  A copy of this report was sent to the requesting provider on this date.  Electronically Signed: Robet Leu, PA-C 10/29/2022, 10:00 AM   I spent a total of    25 Minutes in face to face in clinical consultation, greater than 50% of which was counseling/coordinating care for supra pubic catheter exchange

## 2022-10-29 NOTE — ED Notes (Signed)
Bladder volume is 

## 2022-11-12 ENCOUNTER — Telehealth (HOSPITAL_COMMUNITY): Payer: Self-pay

## 2022-11-12 NOTE — Telephone Encounter (Signed)
Informed pt that we don't have order from urology. I will give her a call to schedule once received. AB

## 2022-11-13 ENCOUNTER — Other Ambulatory Visit (HOSPITAL_COMMUNITY): Payer: Self-pay | Admitting: Urology

## 2022-11-13 DIAGNOSIS — R339 Retention of urine, unspecified: Secondary | ICD-10-CM

## 2022-11-28 ENCOUNTER — Other Ambulatory Visit: Payer: Self-pay

## 2022-11-28 ENCOUNTER — Ambulatory Visit (HOSPITAL_COMMUNITY)
Admission: RE | Admit: 2022-11-28 | Discharge: 2022-11-28 | Disposition: A | Discharge status: Home / Self Care | Payer: 59 | Source: Ambulatory Visit | Attending: Urology | Admitting: Urology

## 2022-11-28 ENCOUNTER — Encounter (HOSPITAL_COMMUNITY): Payer: Self-pay

## 2022-11-28 DIAGNOSIS — R339 Retention of urine, unspecified: Secondary | ICD-10-CM | POA: Insufficient documentation

## 2022-11-28 DIAGNOSIS — Z944 Liver transplant status: Secondary | ICD-10-CM | POA: Insufficient documentation

## 2022-11-28 DIAGNOSIS — E271 Primary adrenocortical insufficiency: Secondary | ICD-10-CM | POA: Diagnosis not present

## 2022-11-28 DIAGNOSIS — K219 Gastro-esophageal reflux disease without esophagitis: Secondary | ICD-10-CM | POA: Diagnosis not present

## 2022-11-28 DIAGNOSIS — E119 Type 2 diabetes mellitus without complications: Secondary | ICD-10-CM | POA: Diagnosis not present

## 2022-11-28 DIAGNOSIS — Z435 Encounter for attention to cystostomy: Secondary | ICD-10-CM | POA: Insufficient documentation

## 2022-11-28 DIAGNOSIS — E063 Autoimmune thyroiditis: Secondary | ICD-10-CM | POA: Diagnosis not present

## 2022-11-28 DIAGNOSIS — K7460 Unspecified cirrhosis of liver: Secondary | ICD-10-CM | POA: Insufficient documentation

## 2022-11-28 DIAGNOSIS — Z794 Long term (current) use of insulin: Secondary | ICD-10-CM | POA: Insufficient documentation

## 2022-11-28 HISTORY — PX: IR CATHETER TUBE CHANGE: IMG717

## 2022-11-28 LAB — GLUCOSE, CAPILLARY
Glucose-Capillary: 85 mg/dL (ref 70–99)
Glucose-Capillary: 61 mg/dL — ABNORMAL LOW (ref 70–99)
Glucose-Capillary: 95 mg/dL (ref 70–99)

## 2022-11-28 MED ORDER — ONDANSETRON HCL 4 MG/2ML IJ SOLN
4.0000 mg | Freq: Once | INTRAMUSCULAR | Status: AC
  Administered 2022-11-28: 4 mg via INTRAVENOUS

## 2022-11-28 MED ORDER — FENTANYL CITRATE (PF) 100 MCG/2ML IJ SOLN
INTRAMUSCULAR | Status: AC
  Filled 2022-11-28: qty 2

## 2022-11-28 MED ORDER — MIDAZOLAM HCL 2 MG/2ML IJ SOLN
INTRAMUSCULAR | Status: AC
  Filled 2022-11-28: qty 2

## 2022-11-28 MED ORDER — LIDOCAINE HCL 1 % IJ SOLN
INTRAMUSCULAR | Status: AC
  Filled 2022-11-28: qty 20

## 2022-11-28 MED ORDER — MIDAZOLAM HCL 2 MG/2ML IJ SOLN
INTRAMUSCULAR | Status: AC | PRN
  Administered 2022-11-28: .5 mg via INTRAVENOUS

## 2022-11-28 MED ORDER — DIPHENHYDRAMINE HCL 50 MG/ML IJ SOLN
INTRAMUSCULAR | Status: AC | PRN
  Administered 2022-11-28: 50 mg via INTRAVENOUS

## 2022-11-28 MED ORDER — IOHEXOL 300 MG/ML  SOLN
50.0000 mL | Freq: Once | INTRAMUSCULAR | Status: AC | PRN
  Administered 2022-11-28: 10 mL

## 2022-11-28 MED ORDER — SODIUM CHLORIDE 0.9 % IV SOLN: 8.0000 mg | Freq: Once | INTRAVENOUS | Status: DC

## 2022-11-28 MED ORDER — ONDANSETRON HCL 4 MG/2ML IJ SOLN
INTRAMUSCULAR | Status: AC
  Filled 2022-11-28: qty 2

## 2022-11-28 MED ORDER — DIPHENHYDRAMINE HCL 50 MG/ML IJ SOLN
INTRAMUSCULAR | Status: AC
  Filled 2022-11-28: qty 1

## 2022-11-28 MED ORDER — FENTANYL CITRATE (PF) 100 MCG/2ML IJ SOLN
INTRAMUSCULAR | Status: AC | PRN
  Administered 2022-11-28: 25 ug via INTRAVENOUS

## 2022-11-28 MED ORDER — LIDOCAINE VISCOUS HCL 2 % MT SOLN
OROMUCOSAL | Status: AC
  Filled 2022-11-28: qty 15

## 2022-11-28 NOTE — H&P (Signed)
Chief Complaint: Patient was seen in consultation today for SP exchange and possible upsize at the request of Winter,Christopher Clifton Custard  Referring Physician(s): Winter,Christopher Clifton Custard  Supervising Physician: Oley Balm  Patient Status: [facility] - Out-pt  History of Present Illness: Patricia Lutz is a 37 y.o. female with PMHs of DM, GERD, hashimoto's disease. 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   Patient laying in bed, not in acute distress. RN at bedside.  Reports mild HA this morning, chronic abd pain and nausea.  Denise  fever, chills, shortness of breath, cough, chest pain, ,vomiting, and bleeding.  Patient states that she was told by IR last time that her SP tube may be upsized to 18 Fr today, she is  not sure why. States that there has been more discomfort and leakage from the insertion site after last exchange.   Past Medical History:  Diagnosis Date   Anxiety    Cirrhosis (HCC)    Depression    Diabetes mellitus without complication (HCC)    GERD (gastroesophageal reflux disease)    Hashimoto's disease    History of stomach ulcers    Hypothyroidism    Substance abuse (HCC)    alcohol    Past Surgical History:  Procedure Laterality Date   CESAREAN SECTION     IR CATHETER TUBE CHANGE  10/29/2022   LIVER TRANSPLANT      Allergies: Ibuprofen  Medications: Prior to Admission medications   Medication Sig Start Date End Date Taking? Authorizing Provider  ALPRAZOLAM PO Take 1 mg by mouth 3 (three) times daily as needed for anxiety (1-2 at 7pm). 12/14/20  Yes [provider]  ASPIRIN LOW DOSE 81 MG EC tablet Take 81 mg by mouth daily. 12/31/20  Yes [provider]  BELBUCA 750 MCG FILM Take 750 mcg by mouth every 12 (twelve) hours.   Yes [provider]  Calcium Citrate-Vitamin D3 315-6.25 MG-MCG TABS Take 2 tablets by mouth 2 (two) times daily at 10 AM and 5 PM. 09/28/19  Yes [provider]  Continuous Blood Gluc Sensor (DEXCOM G7 SENSOR) MISC USE 1 EACH EVERY 10 DAYS   Yes [provider]  cycloSPORINE modified (NEORAL) 25 MG capsule Take 50 mg by mouth in the morning and at bedtime. 12/16/20  Yes [provider]  famotidine (PEPCID) 40 MG tablet Take 40 mg by mouth at bedtime.   Yes [provider]  furosemide (LASIX) 20 MG tablet Take 40 mg by mouth daily.   Yes [provider]  hydrocortisone (CORTEF) 5 MG tablet Take 10-20 mg by mouth See admin instructions. Take 20 mg by mouth in the morning and then take 10 mg by mouth at 3pm per patient 11/17/21  Yes [provider]  lansoprazole (PREVACID) 15 MG capsule Take 30 mg by mouth 2 (two) times daily before a meal.   Yes [provider]  levothyroxine (SYNTHROID) 137 MCG tablet Take 137 mcg by mouth daily before breakfast.   Yes [provider]  magnesium oxide (MAG-OX) 400 MG tablet Take 2 tablets by mouth 2 (two) times daily. 11/01/20  Yes [provider]  NOVOLOG FLEXPEN 100 UNIT/ML FlexPen Inject 0-10 Units into the skin 3 (three) times daily with  meals. Sliding scale   Yes [provider]  ondansetron (ZOFRAN-ODT) 4 MG disintegrating tablet Take 4 mg by mouth every 8 (eight) hours as needed for nausea or vomiting.  04/30/19  Yes [provider]  VEMLIDY 25 MG TABS Take 1 tablet by mouth daily. 11/30/20  Yes [provider]  zolpidem (AMBIEN) 10 MG tablet Take 10 mg by mouth at bedtime as needed for sleep. 01/01/21  Yes [provider]  mupirocin ointment (BACTROBAN) 2 % Apply 1 Application topically daily. 08/01/22   [provider]     Family History   Problem Relation Age of Onset   Crohn's disease Mother    Crohn's disease Maternal Grandmother    Diabetes Brother     Social History   Socioeconomic History   Marital status: Married    Spouse name: Not on file   Number of children: Not on file   Years of education: Not on file   Highest education level: Not on file  Occupational History   Not on file  Tobacco Use   Smoking status: Never   Smokeless tobacco: Never  Vaping Use   Vaping Use: Never used  Substance and Sexual Activity   Alcohol use: Not Currently    Alcohol/week: 0.0 standard drinks of alcohol   Drug use: No   Sexual activity: Not on file  Other Topics Concern   Not on file  Social History Narrative   Not on file   Social Determinants of Health   Financial Resource Strain: Not on file  Food Insecurity: No Food Insecurity (08/17/2022)   Hunger Vital Sign    Worried About Running Out of Food in the Last Year: Never true    Ran Out of Food in the Last Year: Never true  Transportation Needs: No Transportation Needs (08/17/2022)   PRAPARE - Administrator, Civil Service (Medical): No    Lack of Transportation (Non-Medical): No  Physical Activity: Not on file  Stress: Not on file  Social Connections: Not on file     Review of Systems: A 12 point ROS discussed and pertinent positives are indicated in the HPI above.  All other systems are negative.  Vital Signs: BP 101/65   Pulse 61   Temp 98 F (36.7 C) (Oral)   Resp 13   Ht 5\' 1"  (1.549 m)   Wt 159 lb (72.1 kg)   SpO2 97%   BMI 30.04 kg/m    Physical Exam Vitals and nursing note reviewed.  Constitutional:      General: Patient is not in acute distress.    Appearance: Normal appearance. Patient is not ill-appearing.  HENT:     Head: Normocephalic and atraumatic.     Mouth/Throat:     Mouth: Mucous membranes are moist.     Pharynx: Oropharynx is clear.  Cardiovascular:     Rate and Rhythm: Normal rate and regular rhythm.      Pulses: Normal pulses.     Heart sounds: Normal heart sounds.  Pulmonary:     Effort: Pulmonary effort is normal.     Breath sounds: Normal breath sounds.  Abdominal:     General: Abdomen is flat. Bowel sounds are normal.     Palpations: Abdomen is soft.  Musculoskeletal:     Cervical back: Neck supple.  Skin:    General: Skin is warm and dry.     Coloration: Skin is not jaundiced or pale.  Neurological:     Mental  Status: Patient is alert and oriented to person, place, and time.  Psychiatric:        Mood and Affect: Mood normal.        Behavior: Behavior normal.        Judgment: Judgment normal.    MD Evaluation Airway: WNL Heart: WNL Abdomen: WNL Chest/ Lungs: WNL ASA  Classification: 3 Mallampati/Airway Score: One  Imaging: IR Catheter Tube Change  Result Date: 10/29/2022 INDICATION: 37 year old female referred for placement of balloon retention suprapubic urinary catheter. EXAM: IMAGE GUIDED EXCHANGE OF SUPRAPUBIC CATHETER WITH PLACEMENT OF A NEW BALLOON RETENTION CATHETER COMPARISON:  None Available. MEDICATIONS: None ANESTHESIA/SEDATION: Moderate (conscious) sedation was employed during this procedure. A total of Versed 2.0 mg and Fentanyl 100 mcg was administered intravenously by the radiology nurse. Total intra-service moderate Sedation Time: 10 minutes. The patient's level of consciousness and vital signs were monitored continuously by radiology nursing throughout the procedure under my direct supervision. CONTRAST:  10 cc-administered into the collecting system(s) FLUOROSCOPY: Radiation Exposure Index (as provided by the fluoroscopic device): 1 mGy Kerma COMPLICATIONS: None PROCEDURE: Informed written consent was obtained from the patient after a thorough discussion of the procedural risks, benefits and alternatives. All questions were addressed. Maximal Sterile Barrier Technique was utilized including caps, mask, sterile gowns, sterile gloves, sterile drape, hand hygiene  and skin antiseptic. A timeout was performed prior to the initiation of the procedure. Patient was position supine under the image intensifier. The patient is prepped and draped in the usual sterile fashion. 1% lidocaine was used for local anesthesia. Scout images were acquired. Contrast was injected confirming location of the drain in the urinary bladder. 035 stiff wire was advanced into the urinary bladder after amputation of the indwelling catheter. A new 16 [place] tip balloon retention catheter was advanced into the bladder on the wire. Wire was removed and contrast confirmed location. 10 cc of saline used to inflate the balloon. The catheter was attached to leg bag. Final images were stored. Patient tolerated the procedure well and remained hemodynamically stable throughout. No complications were encountered and no significant blood loss. IMPRESSION: Status post image guided exchange of balloon retention suprapubic catheter. New 16 French balloon retention Council tip urinary catheter. Signed, Yvone Neu. Miachel Roux, RPVI [facility] Electronically Signed   By: Gilmer Mor D.O.   On: 10/29/2022 13:07    Labs:  CBC: Recent Labs    08/17/22 0550 09/24/22 0952 10/03/22 1250 10/28/22 2326  WBC 5.0 8.3 6.6 10.0  HGB 10.2* 13.0 13.3 11.7*  HCT 32.3* 39.5 42.1 36.4  PLT 118* 150 167 200    COAGS: Recent Labs    09/24/22 1043  INR 1.1    BMP: Recent Labs    08/16/22 1722 08/17/22 0550 10/03/22 1250 10/28/22 2326  NA 133* 135 140 135  K 4.3 5.1 3.7 3.6  CL 98 102 103 100  CO2 24 25 25 26   GLUCOSE 113* 104* 115* 72  BUN 20 23* 14 14  CALCIUM 9.1 8.1* 9.3 8.9  CREATININE 1.40* 1.33* 1.31* 0.93  GFRNONAA 50* 53* 54* >60    LIVER FUNCTION TESTS: Recent Labs    08/16/22 1722 08/17/22 0550 10/03/22 1250 10/28/22 2326  BILITOT 1.0 1.0 1.4* 0.9  AST 37 39 17 19  ALT 29 25 13 20   ALKPHOS 63 51 48 52  PROT  6.8 5.2* 6.9 6.2*  ALBUMIN 3.8 2.9* 4.4 3.4*    TUMOR MARKERS: No  results for input(s): "AFPTM", "CEA", "CA199", "CHROMGRNA" in the last 8760 hours.  Assessment and Plan: 37 y.o. female with urinary retention who currently has 26F balloon retention council tip S P in placed presents for exchange and possible upsize.   NPO since MN VSS Not on AC/AP Allergies reviewed  Risks and benefits discussed with the patient including bleeding, infection, damage to adjacent structures, and loss of access which may require new placement.   All of the patient's questions were answered, patient is agreeable to proceed. Consent signed and in chart.   Thank you for this interesting consult.  I greatly enjoyed meeting Patricia Lutz and look forward to participating in their care.  A copy of this report was sent to the requesting provider on this date.  Electronically Signed: Willette Brace, PA-C 11/28/2022, 9:10 AM   I spent a total of    25 Minutes in face to face in clinical consultation, greater than 50% of which was counseling/coordinating care for SP exchange and possible upsize.   This chart was dictated using voice recognition software.  Despite best efforts to proofread,  errors can occur which can change the documentation meaning.

## 2022-11-28 NOTE — Procedures (Signed)
  Procedure:  FLuoro guided exchange and upsize suprapubic catheter 18G Preprocedure diagnosis: The encounter diagnosis was Urinary retention. Postprocedure diagnosis: same EBL:    minimal Complications:   none immediate  See full dictation in [employer]. Catheter can subsequently  be exchanged through mature tract as needed without   fluoro guidance.   Thora Lance MD Main # [phone] Pager  [phone] Mobile [phone]

## 2023-03-11 ENCOUNTER — Emergency Department (HOSPITAL_BASED_OUTPATIENT_CLINIC_OR_DEPARTMENT_OTHER)
Admission: EM | Admit: 2023-03-11 | Discharge: 2023-03-11 | Disposition: A | Discharge status: Home / Self Care | Payer: 59 | Attending: Emergency Medicine | Admitting: Emergency Medicine

## 2023-03-11 ENCOUNTER — Other Ambulatory Visit: Payer: Self-pay

## 2023-03-11 ENCOUNTER — Encounter (HOSPITAL_BASED_OUTPATIENT_CLINIC_OR_DEPARTMENT_OTHER): Payer: Self-pay | Admitting: Urology

## 2023-03-11 ENCOUNTER — Other Ambulatory Visit (HOSPITAL_BASED_OUTPATIENT_CLINIC_OR_DEPARTMENT_OTHER): Payer: Self-pay

## 2023-03-11 ENCOUNTER — Emergency Department (HOSPITAL_BASED_OUTPATIENT_CLINIC_OR_DEPARTMENT_OTHER): Payer: 59

## 2023-03-11 DIAGNOSIS — K859 Acute pancreatitis without necrosis or infection, unspecified: Secondary | ICD-10-CM | POA: Diagnosis not present

## 2023-03-11 DIAGNOSIS — G8929 Other chronic pain: Secondary | ICD-10-CM

## 2023-03-11 DIAGNOSIS — R109 Unspecified abdominal pain: Secondary | ICD-10-CM | POA: Diagnosis present

## 2023-03-11 LAB — CBC
HCT: 39.5 % (ref 36.0–46.0)
Hemoglobin: 12.4 g/dL (ref 12.0–15.0)
MCH: 26.2 pg (ref 26.0–34.0)
MCHC: 31.4 g/dL (ref 30.0–36.0)
MCV: 83.5 fL (ref 80.0–100.0)
Platelets: 212 10*3/uL (ref 150–400)
RBC: 4.73 MIL/uL (ref 3.87–5.11)
RDW: 26.1 % — ABNORMAL HIGH (ref 11.5–15.5)
WBC: 6.2 10*3/uL (ref 4.0–10.5)
nRBC: 0 % (ref 0.0–0.2)

## 2023-03-11 LAB — COMPREHENSIVE METABOLIC PANEL WITH GFR
ALT: 31 U/L (ref 0–44)
AST: 24 U/L (ref 15–41)
Albumin: 4.2 g/dL (ref 3.5–5.0)
Alkaline Phosphatase: 63 U/L (ref 38–126)
Anion gap: 8 (ref 5–15)
BUN: 10 mg/dL (ref 6–20)
CO2: 27 mmol/L (ref 22–32)
Calcium: 9.1 mg/dL (ref 8.9–10.3)
Chloride: 100 mmol/L (ref 98–111)
Creatinine, Ser: 1.1 mg/dL — ABNORMAL HIGH (ref 0.44–1.00)
GFR, Estimated: >60 mL/min
Glucose, Bld: 123 mg/dL — ABNORMAL HIGH (ref 70–99)
Potassium: 3.5 mmol/L (ref 3.5–5.1)
Sodium: 135 mmol/L (ref 135–145)
Total Bilirubin: 1 mg/dL (ref 0.3–1.2)
Total Protein: 6.9 g/dL (ref 6.5–8.1)

## 2023-03-11 LAB — LIPASE, BLOOD: Lipase: 167 U/L — ABNORMAL HIGH (ref 11–51)

## 2023-03-11 LAB — PREGNANCY, URINE: Preg Test, Ur: NEGATIVE

## 2023-03-11 MED ORDER — ONDANSETRON 4 MG PO TBDP
4.0000 mg | ORAL_TABLET | Freq: Three times a day (TID) | ORAL | 0 refills | Status: AC | PRN
Start: 2023-03-11 — End: ?

## 2023-03-11 MED ORDER — ONDANSETRON HCL 4 MG/2ML IJ SOLN
4.0000 mg | Freq: Once | INTRAMUSCULAR | Status: AC
  Administered 2023-03-11: 4 mg via INTRAVENOUS
  Filled 2023-03-11: qty 2

## 2023-03-11 MED ORDER — OXYCODONE HCL 5 MG PO TABS
5.0000 mg | ORAL_TABLET | Freq: Three times a day (TID) | ORAL | 0 refills | Status: DC | PRN
  Filled 2023-03-11: qty 15, 5d supply, fill #0

## 2023-03-11 MED ORDER — IOHEXOL 300 MG/ML  SOLN
100.0000 mL | Freq: Once | INTRAMUSCULAR | Status: AC | PRN
  Administered 2023-03-11: 85 mL via INTRAVENOUS

## 2023-03-11 MED ORDER — FENTANYL CITRATE PF 50 MCG/ML IJ SOSY
50.0000 ug | PREFILLED_SYRINGE | Freq: Once | INTRAMUSCULAR | Status: AC
  Administered 2023-03-11: 50 ug via INTRAVENOUS
  Filled 2023-03-11: qty 1

## 2023-03-11 MED ORDER — OXYCODONE HCL 5 MG PO TABS: 10.0000 mg | ORAL_TABLET | Freq: Three times a day (TID) | ORAL | 0 refills | Status: AC | PRN

## 2023-03-11 MED ORDER — ONDANSETRON 4 MG PO TBDP
4.0000 mg | ORAL_TABLET | Freq: Three times a day (TID) | ORAL | 0 refills | Status: DC | PRN
  Filled 2023-03-11: qty 20, 7d supply, fill #0

## 2023-03-11 NOTE — ED Triage Notes (Signed)
Pt states RUQ pain worsening over past week with abd swelling, concern for liver issue, hard mass to RUQ with sharp pain  Some nausea noted but denies vomiting    Extensive H/o liver transplant 3 years ago and suprapubic cath, pain to liver area noted   Currently on Bactrim for UTI

## 2023-03-11 NOTE — Discharge Instructions (Addendum)
As discussed, workup today overall reassuring.  Labs significant for elevation of lipase just in the range of pancreatitis.  CT imaging showed reassuring imaging of your liver and surrounding structures.  Suspect your symptoms are likely secondary to mild acute pancreatitis.  Unfortunately, because you will follow-up with pain management, unable to send any additional opiate pain medicines into the pharmacy.  We will send an additional few days of pain medication to treat your acute worsening abdominal pain.  Recommend calling your transplant team at [facility] to schedule appointment for reassessment.  Please do not hesitate to return to the emergency department if there are worrisome signs and symptoms we discussed become apparent.

## 2023-03-11 NOTE — ED Notes (Signed)
Patient transported to CT 

## 2023-03-11 NOTE — ED Provider Notes (Signed)
[facility] [facility] AT [facility] Provider Note   CSN: [id number] Arrival date & time: 03/11/23  1054     History  Chief Complaint  Patient presents with   Abdominal Pain    Patricia Lutz is a 37 y.o. female.   Abdominal Pain   37 year old female presents to the ED with complaints of abdominal pain.  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  Reports over the past week or so with developing "different" right sided abdominal pain.  States that she called her surgeon at [facility] but does not have an appointment for the next couple of weeks.  Was told to come to the local ER for assessment.  States that pain is not severe but does radiate to the back.  Denies any vomiting, fever, chest pain, shortness of breath, change in bowel habits.  Patient is on Bactrim currently for treatment of urinary tract infection that is followed by [facility].  Reports feelings of nausea but states that this has been present since liver transplant occurred.  Past medical history significant for liver cirrhosis with liver transplant, GERD, Hashimoto's, alcohol use disorder, substance abuse, GAD, adrenal insufficiency, chronic indwelling Foley catheter  Home Medications Prior to Admission medications   Medication Sig Start Date End Date Taking? Authorizing Provider  ondansetron (ZOFRAN-ODT) 4 MG disintegrating tablet Take 1 tablet (4 mg total) by mouth every 8 (eight) hours as needed. 03/11/23  Yes Sherian Maroon A, PA  oxyCODONE (ROXICODONE) 5 MG immediate release tablet Take 2 tablets (10 mg total) by mouth every 8 (eight) hours as needed for up to 4 days for severe pain (pain score 7-10). 03/11/23 03/15/23 Yes Sherian Maroon A, PA  ALPRAZOLAM PO Take 1 mg by mouth 3 (three) times daily as needed for anxiety (1-2 at 7pm). 12/14/20   [provider]  ASPIRIN LOW DOSE 81 MG EC tablet Take 81 mg by mouth daily. 12/31/20   [provider]  BELBUCA 750 MCG FILM Take 750 mcg by mouth every 12 (twelve) hours.    [provider]  Calcium Citrate-Vitamin D3 315-6.25 MG-MCG TABS Take 2 tablets by mouth 2 (two) times daily at 10 AM and 5 PM. 09/28/19   [provider]  Continuous Blood Gluc Sensor (DEXCOM G7 SENSOR) MISC USE 1 EACH EVERY 10 DAYS    [provider]  cycloSPORINE modified (NEORAL) 25 MG capsule Take 50 mg by mouth in the morning and at bedtime. 12/16/20   [provider]  famotidine (PEPCID) 40 MG tablet Take 40 mg by mouth at bedtime.    [provider]  furosemide (LASIX) 20 MG tablet Take 40 mg by mouth daily.    [provider]  hydrocortisone (CORTEF) 5 MG tablet Take 10-20 mg by mouth See admin instructions. Take 20 mg by mouth in the morning and then take 10 mg by mouth at 3pm per patient 11/17/21   [provider]  lansoprazole (PREVACID) 15 MG capsule Take 30 mg by mouth 2 (two) times daily before a meal.    [provider]  levothyroxine (SYNTHROID) 137 MCG tablet Take 137 mcg by mouth daily before breakfast.    [provider]  magnesium oxide (MAG-OX) 400 MG tablet Take 2 tablets by mouth 2 (two) times daily. 11/01/20   [provider]  mupirocin ointment (BACTROBAN) 2 % Apply 1 Application topically daily. 08/01/22   [provider]  NOVOLOG FLEXPEN 100 UNIT/ML  FlexPen Inject 0-10 Units into the skin 3 (three) times daily with meals. Sliding scale    [provider]  ondansetron (ZOFRAN-ODT) 4 MG disintegrating tablet Take 4 mg by mouth every 8 (eight) hours as needed for nausea or vomiting.  04/30/19   [provider]  VEMLIDY 25 MG TABS Take 1 tablet by mouth daily. 11/30/20   [provider]  zolpidem (AMBIEN) 10 MG tablet Take 10 mg by mouth at bedtime as needed for sleep. 01/01/21   [provider]      Allergies    Ibuprofen    Review of Systems   Review of  Systems  Gastrointestinal:  Positive for abdominal pain.  All other systems reviewed and are negative.   Physical Exam Updated Vital Signs BP 113/77   Pulse 64   Temp 98.5 F (36.9 C) (Oral)   Resp 13   Ht 5\' 1"  (1.549 m)   Wt 72.1 kg   SpO2 99%   BMI 30.03 kg/m  Physical Exam Vitals and nursing note reviewed.  Constitutional:      General: She is not in acute distress.    Appearance: She is well-developed.  HENT:     Head: Normocephalic and atraumatic.  Eyes:     Conjunctiva/sclera: Conjunctivae normal.  Cardiovascular:     Rate and Rhythm: Normal rate and regular rhythm.     Heart sounds: No murmur heard. Pulmonary:     Effort: Pulmonary effort is normal. No respiratory distress.     Breath sounds: Normal breath sounds.  Abdominal:     Palpations: Abdomen is soft.     Tenderness: There is abdominal tenderness in the right upper quadrant, right lower quadrant and epigastric area. There is no right CVA tenderness or left CVA tenderness.  Musculoskeletal:        General: No swelling.     Cervical back: Neck supple.  Skin:    General: Skin is warm and dry.     Capillary Refill: Capillary refill takes less than 2 seconds.  Neurological:     Mental Status: She is alert.  Psychiatric:        Mood and Affect: Mood normal.     ED Results / Procedures / Treatments   Labs (all labs ordered are listed, but only abnormal results are displayed) Labs Reviewed  CBC - Abnormal; Notable for the following components:      Result Value   RDW 26.1 (*)    All other components within normal limits  COMPREHENSIVE METABOLIC PANEL - Abnormal; Notable for the following components:   Glucose, Bld 123 (*)    Creatinine, Ser 1.10 (*)    All other components within normal limits  LIPASE, BLOOD - Abnormal; Notable for the following components:   Lipase 167 (*)    All other components within normal limits  PREGNANCY, URINE    EKG None  Radiology CT ABDOMEN PELVIS W  CONTRAST  Result Date: 03/11/2023 CLINICAL DATA:  Right upper quadrant abdominal pain for 1 week. EXAM: CT ABDOMEN AND PELVIS WITH CONTRAST TECHNIQUE: Multidetector CT imaging of the abdomen and pelvis was performed using the standard protocol following bolus administration of intravenous contrast. RADIATION DOSE REDUCTION: This exam was performed according to the departmental dose-optimization program which includes automated exposure control, adjustment of the mA and/or kV according to patient size and/or use of iterative reconstruction technique. CONTRAST:  85mL OMNIPAQUE IOHEXOL 300 MG/ML  SOLN COMPARISON:  Oct 03, 2022. FINDINGS: Lower chest: No acute  abnormality. Hepatobiliary: Stable postoperative changes related to liver transplant. Stable mild intrahepatic and extrahepatic biliary dilatation is noted. Stable hepatic cysts. Pancreas: Unremarkable. No pancreatic ductal dilatation or surrounding inflammatory changes. Spleen: Normal in size without focal abnormality. Adrenals/Urinary Tract: Adrenal glands and kidneys appear normal. No hydronephrosis or renal obstruction is noted. Suprapubic catheter is seen entering urinary bladder. Stomach/Bowel: Stomach is within normal limits. Appendix appears normal. No evidence of bowel wall thickening, distention, or inflammatory changes. Stool is noted throughout the colon. 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 No enlarged abdominal or pelvic lymph nodes. Reproductive: Intrauterine device is noted. No adnexal abnormality is noted. Other: No abdominal wall hernia or abnormality. No abdominopelvic ascites. Musculoskeletal: No acute or significant osseous findings. IMPRESSION: Stable postoperative changes related to liver transplant. No acute abnormality seen in the abdomen or pelvis. Electronically Signed   By: Lupita Raider M.D.   On: 03/11/2023 16:40    Procedures Procedures    Medications Ordered in ED Medications   fentaNYL (SUBLIMAZE) injection 50 mcg (50 mcg Intravenous Given 03/11/23 1143)  ondansetron (ZOFRAN) injection 4 mg (4 mg Intravenous Given 03/11/23 1142)  iohexol (OMNIPAQUE) 300 MG/ML solution 100 mL (85 mLs Intravenous Contrast Given 03/11/23 1328)  fentaNYL (SUBLIMAZE) injection 50 mcg (50 mcg Intravenous Given 03/11/23 1733)    ED Course/ Medical Decision Making/ A&P                                 Medical Decision Making Amount and/or Complexity of Data Reviewed Labs: ordered. Radiology: ordered.  Risk Prescription drug management.   This patient presents to the ED for concern of abdominal pain, this involves an extensive number of treatment options, and is a complaint that carries with it a high risk of complications and morbidity.  The differential diagnosis includes gastritis, PUD, pancreatitis, hyperemesis cannabinoid syndrome, gastroparesis, CBD pathology, cholecystitis, SBO/LBO, volvulus, diverticulitis, appendicitis, pyelonephritis, cystitis, ectopic pregnancy, ovarian torsion, tubo-ovarian abscess, other   Co morbidities that complicate the patient evaluation  See HPI   Additional history obtained:  Additional history obtained from EMR External records from outside source obtained and reviewed including hospital records   Lab Tests:  I Ordered, and personally interpreted labs.  The pertinent results include: No leukocytosis.  No evidence of anemia.  Platelets within range.  No electrolyte abnormalities.  Creatinine 1.10 which is near patient's baseline.  No transaminitis.  BUN within normal limits.  Urine pregnancy negative.  Lipase elevated at 167   Imaging Studies ordered:  I ordered imaging studies including CT abdomen pelvis I independently visualized and interpreted imaging which showed stable postop changes.  No acute abnormality. I agree with the radiologist interpretation   Cardiac Monitoring: / EKG:  The patient was maintained on a cardiac  monitor.  I personally viewed and interpreted the cardiac monitored which showed an underlying rhythm of: Sinus rhythm   Consultations Obtained:  N/a   Problem List / ED Course / Critical interventions / Medication management  Pancreatitis, chronic abdominal pain I ordered medication including fentanyl, Zofran   Reevaluation of the patient after these medicines showed that the patient improved I have reviewed the patients home medicines and have made adjustments as needed   Social Determinants of Health:  History of alcohol abuse, denies any current illicit drug use or tobacco use.   Test / Admission - Considered:  Pancreatitis, chronic abdominal pain Vitals signs within normal range  and stable throughout visit. Laboratory/imaging studies significant for: See above 37 year old female presents emergency department with complaints of abdominal pain, nausea.  Patient with history of chronic abdominal pain status post liver transplant.  Does follow with pain management due to chronic abdominal pain.  Patient presented due to worsening as well as different experience of pain over the past week or so from her more chronic abdominal pain.  On exam, patient with tenderness in right upper quadrant as well as epigastric region.  Labs concerning for pancreatitis with lipase of 167 otherwise, labs reassuring.  CT imaging also reassuring.  Suspect patient's symptoms are likely secondary to acute pancreatitis.  Patient on baseline pain medicine in the form of oxycodone 5 mg as well as buprenorphine, will give short course of pain medicine for treatment of acute on chronic abdominal pain until follow-up with pain specialist.  Will also recommend very close follow-up with transplant team in the outpatient setting for reevaluation.  Strict return precautions discussed at length.  Treatment plan discussed with patient and she acknowledged understanding was agreeable to said plan.  Patient overall  well-appearing, afebrile in no acute distress, tolerating p.o. without difficulty. Worrisome signs and symptoms were discussed with the patient, and the patient acknowledged understanding to return to the ED if noticed. Patient was stable upon discharge.          Final Clinical Impression(s) / ED Diagnoses Final diagnoses:  Acute pancreatitis without infection or necrosis, unspecified pancreatitis type  Chronic abdominal pain    Rx / DC Orders ED Discharge Orders          Ordered    oxyCODONE (ROXICODONE) 5 MG immediate release tablet  Every 8 hours PRN,   Status:  Discontinued        03/11/23 1709    ondansetron (ZOFRAN-ODT) 4 MG disintegrating tablet  Every 8 hours PRN,   Status:  Discontinued        03/11/23 1710    oxyCODONE (ROXICODONE) 5 MG immediate release tablet  Every 8 hours PRN        03/11/23 1751    ondansetron (ZOFRAN-ODT) 4 MG disintegrating tablet  Every 8 hours PRN        03/11/23 1751              Peter Garter, PA 03/11/23 1753    Melene Plan, DO 03/12/23 [phone]

## 2023-03-11 NOTE — ED Notes (Addendum)
Reviewed discharge instructions, medications, and home care with pt. Pt verbalized understanding and had no further questions. Pt exited ED without complications. Family waiting in lobby to drive pt home.

## 2023-03-17 ENCOUNTER — Encounter (HOSPITAL_BASED_OUTPATIENT_CLINIC_OR_DEPARTMENT_OTHER): Payer: Self-pay

## 2023-03-17 ENCOUNTER — Other Ambulatory Visit: Payer: Self-pay

## 2023-03-17 ENCOUNTER — Emergency Department (HOSPITAL_BASED_OUTPATIENT_CLINIC_OR_DEPARTMENT_OTHER)
Admission: EM | Admit: 2023-03-17 | Discharge: 2023-03-18 | Disposition: A | Discharge status: Home / Self Care | Payer: 59

## 2023-03-17 DIAGNOSIS — Z794 Long term (current) use of insulin: Secondary | ICD-10-CM | POA: Diagnosis not present

## 2023-03-17 DIAGNOSIS — F1099 Alcohol use, unspecified with unspecified alcohol-induced disorder: Secondary | ICD-10-CM | POA: Diagnosis not present

## 2023-03-17 DIAGNOSIS — Z7982 Long term (current) use of aspirin: Secondary | ICD-10-CM | POA: Insufficient documentation

## 2023-03-17 DIAGNOSIS — R112 Nausea with vomiting, unspecified: Secondary | ICD-10-CM | POA: Diagnosis not present

## 2023-03-17 DIAGNOSIS — R1084 Generalized abdominal pain: Secondary | ICD-10-CM | POA: Insufficient documentation

## 2023-03-17 DIAGNOSIS — R197 Diarrhea, unspecified: Secondary | ICD-10-CM | POA: Insufficient documentation

## 2023-03-17 DIAGNOSIS — N3001 Acute cystitis with hematuria: Secondary | ICD-10-CM

## 2023-03-17 DIAGNOSIS — R109 Unspecified abdominal pain: Secondary | ICD-10-CM

## 2023-03-17 DIAGNOSIS — G8929 Other chronic pain: Secondary | ICD-10-CM

## 2023-03-17 LAB — COMPREHENSIVE METABOLIC PANEL
ALT: 21 U/L (ref 0–44)
AST: 26 U/L (ref 15–41)
Albumin: 4.4 g/dL (ref 3.5–5.0)
Alkaline Phosphatase: 56 U/L (ref 38–126)
Anion gap: 8 (ref 5–15)
BUN: 12 mg/dL (ref 6–20)
CO2: 27 mmol/L (ref 22–32)
Calcium: 9.7 mg/dL (ref 8.9–10.3)
Chloride: 103 mmol/L (ref 98–111)
Creatinine, Ser: 0.94 mg/dL (ref 0.44–1.00)
GFR, Estimated: >60 mL/min (ref >60)
Glucose, Bld: 106 mg/dL — ABNORMAL HIGH (ref 70–99)
Potassium: 4.2 mmol/L (ref 3.5–5.1)
Sodium: 138 mmol/L (ref 135–145)
Total Bilirubin: 1.4 mg/dL — ABNORMAL HIGH (ref 0.3–1.2)
Total Protein: 7.1 g/dL (ref 6.5–8.1)

## 2023-03-17 LAB — CBC WITH DIFFERENTIAL/PLATELET
Abs Immature Granulocytes: 0.01 10*3/uL (ref 0.00–0.07)
Basophils Absolute: 0 10*3/uL (ref 0.0–0.1)
Basophils Relative: 0 %
Eosinophils Absolute: 0.1 10*3/uL (ref 0.0–0.5)
Eosinophils Relative: 1 %
HCT: 44 % (ref 36.0–46.0)
Hemoglobin: 13.8 g/dL (ref 12.0–15.0)
Immature Granulocytes: 0 %
Lymphocytes Relative: 12 %
Lymphs Abs: 0.8 10*3/uL (ref 0.7–4.0)
MCH: 26.8 pg (ref 26.0–34.0)
MCHC: 31.4 g/dL (ref 30.0–36.0)
MCV: 85.4 fL (ref 80.0–100.0)
Monocytes Absolute: 0.4 10*3/uL (ref 0.1–1.0)
Monocytes Relative: 6 %
Neutro Abs: 5.3 10*3/uL (ref 1.7–7.7)
Neutrophils Relative %: 81 %
Platelets: 209 10*3/uL (ref 150–400)
RBC: 5.15 MIL/uL — ABNORMAL HIGH (ref 3.87–5.11)
RDW: 26.3 % — ABNORMAL HIGH (ref 11.5–15.5)
WBC: 6.7 10*3/uL (ref 4.0–10.5)
nRBC: 0 % (ref 0.0–0.2)

## 2023-03-17 LAB — URINALYSIS, ROUTINE W REFLEX MICROSCOPIC
Bilirubin Urine: NEGATIVE
Glucose, UA: NEGATIVE mg/dL
Ketones, ur: NEGATIVE mg/dL
Nitrite: NEGATIVE
Protein, ur: NEGATIVE mg/dL
Specific Gravity, Urine: <1.005 — ABNORMAL LOW (ref 1.005–1.030)
pH: 6 (ref 5.0–8.0)

## 2023-03-17 LAB — LIPASE, BLOOD: Lipase: 37 U/L (ref 11–51)

## 2023-03-17 MED ORDER — DROPERIDOL 2.5 MG/ML IJ SOLN
1.2500 mg | Freq: Once | INTRAMUSCULAR | Status: AC
  Administered 2023-03-17: 1.25 mg via INTRAVENOUS
  Filled 2023-03-17: qty 2

## 2023-03-17 MED ORDER — FENTANYL CITRATE PF 50 MCG/ML IJ SOSY
25.0000 ug | PREFILLED_SYRINGE | Freq: Once | INTRAMUSCULAR | Status: AC
  Administered 2023-03-17: 25 ug via INTRAVENOUS
  Filled 2023-03-17: qty 1

## 2023-03-17 MED ORDER — CEPHALEXIN 250 MG PO CAPS
500.0000 mg | ORAL_CAPSULE | Freq: Once | ORAL | Status: AC
  Administered 2023-03-17: 500 mg via ORAL
  Filled 2023-03-17: qty 2

## 2023-03-17 NOTE — ED Notes (Signed)
[place] PA-C to switch out suprapubic catheter for urine sample.

## 2023-03-17 NOTE — ED Provider Notes (Signed)
  Accepted handoff at shift change from [facility]. Please see prior provider note for more detail.   Briefly: Patient is 37 y.o. " complaints of abdominal pain, nausea, vomiting, diarrhea.  Patient recently seen in the ED on 03/11/2023 and diagnosed with acute mild pancreatitis with elevation of lipase of 167 accompanying patient's epigastric abdominal pain.  Patient states that she has been able to manage her abdominal pain at home but last night, developed worsening nausea as well as vomiting and a couple episodes of loose bowel movements prompting visit to the emergency department again today.  States that she has been at home Zofran but was not helping initially.  Denies any fever, chills, chest pain, shortness of breath, urinary symptoms, medic easier/melena, hematemesis.  Reports upper abdominal pain without radiation.  Patient states that she wants to go home as long as her labs look okay and her symptoms are under control."  DDX: concern for gastroenteritis, medication side effect, enteritis, PUD, cholecystitis, pancreatitis, CBD pathology, SBO/LBO, volvulus, hepatitis, UTI, pyelonephritis   Plan:  - dispo pending UA  - UA not overly concerning for infection with trace leukocytes and bacteria; however, patient stating that it feels like she has a UTI because her symptoms with past UTI's included a feeling or urinary frequency.  Past urine culture sensitive to Keflex.  Provided patient with a dose of Keflex here in the emergency room.  Sending rest of the prescription to her pharmacy. - patient passed PO challenge. States that she feels better and is ready for discharge. Patient does have a ride home that should be here around 11PM. - patient afebrile with stable vitals.  Provided with return precautions.  Discharged in condition.      Tigerlily, [place], [place] 03/17/23 2215    Coral Spikes, DO 03/17/23 2325

## 2023-03-17 NOTE — ED Provider Notes (Addendum)
[place] EMERGENCY DEPARTMENT AT [facility] Provider Note   CSN: [id number] Arrival date & time: 03/17/23  1715     History  Chief Complaint  Patient presents with   Abdominal Pain    Patricia Lutz is a 37 y.o. female.  HPI   37 year old female presents emergency department with complaints of abdominal pain, nausea, vomiting, diarrhea.  Patient recently seen in the ED on 03/11/2023 and diagnosed with acute mild pancreatitis with elevation of lipase of 167 accompanying patient's epigastric abdominal pain.  Patient states that she has been able to manage her abdominal pain at home but last night, developed worsening nausea as well as vomiting and a couple episodes of loose bowel movements prompting visit to the emergency department again today.  States that she has been at home Zofran but was not helping initially.  Denies any fever, chills, chest pain, shortness of breath, urinary symptoms, medic easier/melena, hematemesis.  Reports upper abdominal pain without radiation.  Patient states that she wants to go home as long as her labs look okay and her symptoms are under control.  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 Authorizing Provider  ALPRAZOLAM PO Take 1 mg by mouth 3 (three) times daily as needed for anxiety (1-2 at 7pm). 12/14/20   [provider]  ASPIRIN LOW DOSE 81 MG EC tablet Take 81 mg by mouth daily. 12/31/20   [provider]  BELBUCA 750 MCG FILM Take 750 mcg by mouth every 12 (twelve) hours.    [provider]  Calcium Citrate-Vitamin D3 315-6.25 MG-MCG TABS Take 2 tablets by mouth 2 (two) times daily at 10 AM and 5 PM. 09/28/19   [provider]  Continuous Blood Gluc Sensor (DEXCOM G7 SENSOR) MISC USE 1 EACH EVERY 10 DAYS    [provider]  cycloSPORINE modified (NEORAL) 25 MG capsule Take 50 mg by mouth in the morning and at bedtime. 12/16/20   [provider]  famotidine (PEPCID) 40 MG tablet Take 40 mg by mouth at bedtime.    [provider]  furosemide (LASIX) 20 MG tablet Take 40 mg by mouth daily.    [provider]  hydrocortisone (CORTEF) 5 MG tablet Take 10-20 mg by mouth See admin instructions. Take 20 mg by mouth in the morning and then take 10 mg by mouth at 3pm per patient 11/17/21   [provider]  lansoprazole (PREVACID) 15 MG capsule Take 30 mg by mouth 2 (two) times daily before a meal.    [provider]  levothyroxine (SYNTHROID) 137 MCG tablet Take 137 mcg by mouth daily before breakfast.    [provider]  magnesium oxide (MAG-OX) 400 MG tablet Take 2 tablets by mouth 2 (two) times daily. 11/01/20   [provider]  mupirocin ointment (BACTROBAN) 2 % Apply 1 Application topically daily. 08/01/22   [provider]  NOVOLOG FLEXPEN 100 UNIT/ML FlexPen Inject 0-10 Units into the skin 3 (three) times daily with meals. Sliding scale    [provider]  ondansetron (ZOFRAN-ODT) 4 MG disintegrating tablet Take 4 mg by mouth every 8 (eight) hours as needed for nausea or vomiting.  04/30/19   [provider]  ondansetron (ZOFRAN-ODT) 4 MG disintegrating tablet Take  1 tablet (4 mg total) by mouth every 8 (eight) hours as needed. 03/11/23   Sherian Maroon A, PA  VEMLIDY 25 MG TABS Take 1 tablet by mouth daily. 11/30/20   [provider]  zolpidem (AMBIEN) 10 MG tablet Take 10 mg by mouth at bedtime as needed for sleep. 01/01/21   [provider]      Allergies    Ibuprofen    Review of Systems   Review of Systems  All other systems reviewed and are negative.   Physical Exam Updated Vital  Signs BP 95/64   Pulse (!) 59   Temp 98.4 F (36.9 C) (Oral)   Resp 17   SpO2 100%  Physical Exam Vitals and nursing note reviewed.  Constitutional:      General: She is not in acute distress.    Appearance: She is well-developed.  HENT:     Head: Normocephalic and atraumatic.  Eyes:     Conjunctiva/sclera: Conjunctivae normal.  Cardiovascular:     Rate and Rhythm: Normal rate and regular rhythm.     Heart sounds: No murmur heard. Pulmonary:     Effort: Pulmonary effort is normal. No respiratory distress.     Breath sounds: Normal breath sounds.  Abdominal:     Palpations: Abdomen is soft.     Tenderness: There is abdominal tenderness in the right upper quadrant, epigastric area and left upper quadrant. There is no right CVA tenderness, left CVA tenderness or rebound. Negative signs include McBurney's sign.     Comments: Suprapubic Foley catheter in place.  Musculoskeletal:        General: No swelling.     Cervical back: Neck supple.  Skin:    General: Skin is warm and dry.     Capillary Refill: Capillary refill takes less than 2 seconds.  Neurological:     Mental Status: She is alert.  Psychiatric:        Mood and Affect: Mood normal.     ED Results / Procedures / Treatments   Labs (all labs ordered are listed, but only abnormal results are displayed) Labs Reviewed  COMPREHENSIVE METABOLIC PANEL - Abnormal; Notable for the following components:      Result Value   Glucose, Bld 106 (*)    Total Bilirubin 1.4 (*)    All other components within normal limits  CBC WITH DIFFERENTIAL/PLATELET - Abnormal; Notable for the following components:   RBC 5.15 (*)    RDW 26.3 (*)    All other components within normal limits  LIPASE, BLOOD  URINALYSIS, ROUTINE W REFLEX MICROSCOPIC    EKG None  Radiology No results found.  Procedures BLADDER CATHETERIZATION  Date/Time: 03/17/2023 7:18 PM  Performed by: Peter Garter, PA Authorized by: Peter Garter, PA    Consent:    Consent obtained:  Verbal   Consent given by:  Patient   Risks, benefits, and alternatives were discussed: yes     Risks discussed:  Incomplete procedure, false passage, infection and pain   Alternatives discussed:  No treatment, delayed treatment and alternative treatment Universal protocol:    Procedure explained and questions answered to patient or proxy's satisfaction: yes     Patient identity confirmed:  Verbally with patient Pre-procedure details:    Procedure purpose:  Diagnostic   Preparation: Patient was prepped and draped in usual sterile fashion   Anesthesia:    Anesthesia method:  None Procedure details:    Provider performed due to:  Altered anatomy  Altered anatomy details: Suprapubic.   Catheter insertion:  Indwelling   Catheter type:  Foley   Catheter size:  16 Fr   Bladder irrigation: no     Number of attempts:  1   Urine characteristics:  Clear Post-procedure details:    Procedure completion:  Tolerated well, no immediate complications     Medications Ordered in ED Medications  droperidol (INAPSINE) 2.5 MG/ML injection 1.25 mg (1.25 mg Intravenous Given 03/17/23 1813)  fentaNYL (SUBLIMAZE) injection 25 mcg (25 mcg Intravenous Given 03/17/23 1814)    ED Course/ Medical Decision Making/ A&P                                  Medical Decision Making Amount and/or Complexity of Data Reviewed Labs: ordered.  Risk Prescription drug management.   This patient presents to the ED for concern of abdominal pain, nausea, vomiting, diarrhea, this involves an extensive number of treatment options, and is a complaint that carries with it a high risk of complications and morbidity.  The differential diagnosis includes gastroenteritis, medication side effect, enteritis, PUD, cholecystitis, pancreatitis, CBD pathology, SBO/LBO, volvulus, hepatitis, UTI, pyelonephritis   Co morbidities that complicate the patient evaluation  See HPI   Additional  history obtained:  Additional history obtained from EMR External records from outside source obtained and reviewed including hospital records   Lab Tests:  I Ordered, and personally interpreted labs.  The pertinent results include: No leukocytosis.  No evidence of anemia.  Platelets within range.  UA pending abdominal pain, nausea, vomiting   Imaging Studies ordered:  N/a   Cardiac Monitoring: / EKG:  The patient was maintained on a cardiac monitor.  I personally viewed and interpreted the cardiac monitored which showed an underlying rhythm of: Sinus rhythm   Consultations Obtained:  N/a   Problem List / ED Course / Critical interventions / Medication management  Abdominal pain, nausea, vomiting, diarrhea I ordered medication including fentanyl, droperidol   Reevaluation of the patient after these medicines showed that the patient improved I have reviewed the patients home medicines and have made adjustments as needed   Social Determinants of Health:  Denies tobacco, illicit drug use   Test / Admission - Considered:  Abdominal pain, nausea, vomiting, diarrhea Vitals signs within normal range and stable throughout visit. Laboratory studies significant for: See above 37 year old female presents again to the emergency department for complaints of abdominal pain, nausea, vomiting and loose bowel movements.  On exam, patient with tenderness in the upper abdomen as well as mild tenderness in suprapubic region.  Patient recently seen in the ED and diagnosed with mild pancreatitis on the 21st.  Workup today overall reassuring.  Labs unremarkable for any acute process.  Pending UA at time of shift change.  Patient treated with medications without any episodes of emesis and resolution of nausea while in the ED.  Offered patient admission given that she recently was seen on the 21st, has history of liver transplant and is on immunosuppressant medications per patient declined at this  time.  She would rather manage her symptoms at home as long as "everything looks okay on my labs."  Currently trialing p.o. as well as sending urine for assessment.  At time of shift change, patient care handed off to [facility].  Patient stable upon shift change.         Final Clinical Impression(s) / ED Diagnoses Final diagnoses:  None  Rx / DC Orders ED Discharge Orders     None         Peter Garter, [place] 03/17/23 1917    Peter Garter, [place] 03/17/23 1918    Coral Spikes, DO 03/17/23 2325

## 2023-03-17 NOTE — ED Triage Notes (Signed)
Pt c/o NVD, abd pain onset Friday, "it spiraled very quickly." Seen here & dx pancreatitis Monday, states she was sent home w prescriptions "& attempted to handle it at home but it just got so bad."

## 2023-03-17 NOTE — Discharge Instructions (Signed)
It was pleasure caring for you today.  I sent the prescription for antibiotics to your pharmacy.  Please follow-up with your urologist.  Seek emergency care if experiencing any new or worsening symptoms.

## 2023-03-18 ENCOUNTER — Telehealth (HOSPITAL_BASED_OUTPATIENT_CLINIC_OR_DEPARTMENT_OTHER): Payer: Self-pay | Admitting: Emergency Medicine

## 2023-03-18 ENCOUNTER — Encounter (HOSPITAL_BASED_OUTPATIENT_CLINIC_OR_DEPARTMENT_OTHER): Payer: Self-pay | Admitting: Emergency Medicine

## 2023-03-18 MED ORDER — CEPHALEXIN 500 MG PO CAPS: 500.0000 mg | ORAL_CAPSULE | Freq: Four times a day (QID) | ORAL | 0 refills | Status: DC

## 2023-03-18 NOTE — Telephone Encounter (Signed)
Patient was supposed be discharged with Keflex, reportedly did not receive prescription.  I did not evaluate this patient, have sent in prescription for Keflex.  Reviewed the previous physician's chart.

## 2023-04-04 ENCOUNTER — Encounter (HOSPITAL_BASED_OUTPATIENT_CLINIC_OR_DEPARTMENT_OTHER): Payer: Self-pay | Admitting: Emergency Medicine

## 2023-04-04 ENCOUNTER — Emergency Department (HOSPITAL_BASED_OUTPATIENT_CLINIC_OR_DEPARTMENT_OTHER)
Admission: EM | Admit: 2023-04-04 | Discharge: 2023-04-04 | Disposition: A | Discharge status: Home / Self Care | Payer: 59 | Attending: Emergency Medicine | Admitting: Emergency Medicine

## 2023-04-04 ENCOUNTER — Emergency Department (HOSPITAL_BASED_OUTPATIENT_CLINIC_OR_DEPARTMENT_OTHER): Payer: 59

## 2023-04-04 ENCOUNTER — Other Ambulatory Visit: Payer: Self-pay

## 2023-04-04 DIAGNOSIS — R1011 Right upper quadrant pain: Secondary | ICD-10-CM

## 2023-04-04 DIAGNOSIS — Z7982 Long term (current) use of aspirin: Secondary | ICD-10-CM | POA: Diagnosis not present

## 2023-04-04 DIAGNOSIS — R110 Nausea: Secondary | ICD-10-CM | POA: Insufficient documentation

## 2023-04-04 LAB — URINALYSIS, ROUTINE W REFLEX MICROSCOPIC
Bacteria, UA: NONE SEEN
Bilirubin Urine: NEGATIVE
Glucose, UA: NEGATIVE mg/dL
Ketones, ur: NEGATIVE mg/dL
Nitrite: NEGATIVE
Protein, ur: NEGATIVE mg/dL
Specific Gravity, Urine: <1.005 — ABNORMAL LOW (ref 1.005–1.030)
pH: 7.5 (ref 5.0–8.0)

## 2023-04-04 LAB — CBC WITH DIFFERENTIAL/PLATELET
Abs Immature Granulocytes: 0.02 10*3/uL (ref 0.00–0.07)
Basophils Absolute: 0 10*3/uL (ref 0.0–0.1)
Basophils Relative: 0 %
Eosinophils Absolute: 0.1 10*3/uL (ref 0.0–0.5)
Eosinophils Relative: 1 %
HCT: 43.5 % (ref 36.0–46.0)
Hemoglobin: 13.9 g/dL (ref 12.0–15.0)
Immature Granulocytes: 0 %
Lymphocytes Relative: 24 %
Lymphs Abs: 1.6 10*3/uL (ref 0.7–4.0)
MCH: 28.1 pg (ref 26.0–34.0)
MCHC: 32 g/dL (ref 30.0–36.0)
MCV: 88.1 fL (ref 80.0–100.0)
Monocytes Absolute: 0.5 10*3/uL (ref 0.1–1.0)
Monocytes Relative: 7 %
Neutro Abs: 4.7 10*3/uL (ref 1.7–7.7)
Neutrophils Relative %: 68 %
Platelets: 165 10*3/uL (ref 150–400)
RBC: 4.94 MIL/uL (ref 3.87–5.11)
RDW: 23.6 % — ABNORMAL HIGH (ref 11.5–15.5)
WBC: 6.9 10*3/uL (ref 4.0–10.5)
nRBC: 0 % (ref 0.0–0.2)

## 2023-04-04 LAB — COMPREHENSIVE METABOLIC PANEL
ALT: 18 U/L (ref 0–44)
AST: 20 U/L (ref 15–41)
Albumin: 4.2 g/dL (ref 3.5–5.0)
Alkaline Phosphatase: 59 U/L (ref 38–126)
Anion gap: 9 (ref 5–15)
BUN: 13 mg/dL (ref 6–20)
CO2: 26 mmol/L (ref 22–32)
Calcium: 9.6 mg/dL (ref 8.9–10.3)
Chloride: 102 mmol/L (ref 98–111)
Creatinine, Ser: 0.86 mg/dL (ref 0.44–1.00)
GFR, Estimated: >60 mL/min (ref >60)
Glucose, Bld: 89 mg/dL (ref 70–99)
Potassium: 4.1 mmol/L (ref 3.5–5.1)
Sodium: 137 mmol/L (ref 135–145)
Total Bilirubin: 0.8 mg/dL (ref <1.2)
Total Protein: 7.2 g/dL (ref 6.5–8.1)

## 2023-04-04 LAB — PREGNANCY, URINE: Preg Test, Ur: NEGATIVE

## 2023-04-04 LAB — LIPASE, BLOOD: Lipase: 28 U/L (ref 11–51)

## 2023-04-04 MED ORDER — MORPHINE SULFATE (PF) 4 MG/ML IV SOLN: 4.0000 mg | Freq: Once | INTRAVENOUS | Status: DC

## 2023-04-04 MED ORDER — IOHEXOL 300 MG/ML  SOLN
100.0000 mL | Freq: Once | INTRAMUSCULAR | Status: AC | PRN
Start: 2023-04-04 — End: 2023-04-04
  Administered 2023-04-04: 85 mL via INTRAVENOUS

## 2023-04-04 MED ORDER — MORPHINE SULFATE (PF) 4 MG/ML IV SOLN
4.0000 mg | Freq: Once | INTRAVENOUS | Status: AC
  Administered 2023-04-04: 4 mg via INTRAVENOUS
  Filled 2023-04-04: qty 1

## 2023-04-04 MED ORDER — ONDANSETRON HCL 4 MG/2ML IJ SOLN
4.0000 mg | Freq: Once | INTRAMUSCULAR | Status: AC
  Administered 2023-04-04: 4 mg via INTRAVENOUS
  Filled 2023-04-04: qty 2

## 2023-04-04 MED ORDER — LIDOCAINE 5 % EX PTCH: 1.0000 | MEDICATED_PATCH | CUTANEOUS | 0 refills | Status: DC

## 2023-04-04 MED ORDER — HYDROCODONE-ACETAMINOPHEN 5-325 MG PO TABS
1.0000 | ORAL_TABLET | Freq: Once | ORAL | Status: AC
  Administered 2023-04-04: 1 via ORAL
  Filled 2023-04-04: qty 1

## 2023-04-04 NOTE — ED Provider Triage Note (Signed)
Emergency Medicine Provider Triage Evaluation Note  Patricia Lutz , a 37 y.o. female  was evaluated in triage.  Pt complains of abdominal pain. H/o liver transplant and has indwelling suprapubic catheter States she always has abdominal pain but this morning is was much worse. RUQ pain. States urine is also cloudy. Also states that when catheter was changed recently and it was the wrong size. Denies fever.   Review of Systems  Positive: See above Negative: See above  Physical Exam  There were no vitals taken for this visit. Gen:   Awake, no distress   Resp:  Normal effort  MSK:   Moves extremities without difficulty  Other:    Medical Decision Making  Medically screening exam initiated at 4:25 PM.  Appropriate orders placed.  [name] was informed that the remainder of the evaluation will be completed by another provider, this initial triage assessment does not replace that evaluation, and the importance of remaining in the ED until their evaluation is complete.  Work up started   Gareth Eagle, PA-C 04/04/23 1629

## 2023-04-04 NOTE — Discharge Instructions (Addendum)
You were seen in the emergency room today for abdominal pain your labs and imaging came back reassuring.  Your CT scan of the abdomen shows normal postop changes from liver transplant.  Your urinalysis does not show sign of urinary tract infection however I have sent urine for culture.  Your labs are also unremarkable without sign of elevated bilirubin or liver enzyme.  I would recommend following up with your liver surgeon as soon as possible to discuss your symptoms, labs and imaging.  In the meantime I recommend following up with pain management or primary care doctor.  Return to emergency room with new or worsening symptoms.

## 2023-04-04 NOTE — ED Notes (Signed)
Pt currently has 16 Fr suprapubic catheter

## 2023-04-04 NOTE — ED Provider Notes (Signed)
[place] EMERGENCY DEPARTMENT AT [facility] Provider Note   CSN: [id number] Arrival date & time: 04/04/23  1605     History  Chief Complaint  Patient presents with   Abdominal Pain    Patricia Lutz is Lutz 37 y.o. female with past medical history of alcohol use disorder, liver transplant, Addison's disease, chronic indwelling Foley presenting to emergency room with worsening right upper quadrant abdominal pain.  Patient reports she has chronic abdominal pain however it was worse this morning.  Patient reports associated nausea denies any vomiting or diarrhea.  Reports no change in the appearance of her stool.  Patient reports follow-up with liver doctor in 2 to 3 weeks.  Patient has had regular labs and has been taking her antirejection medication.  Patient is requesting that her suprapubic catheter is change, feels that the Foley is too large.  Patient reports urine appears cloudy to her. Denies fever, chills, change in appetite.    Abdominal Pain      Home Medications Prior to Admission medications   Medication Sig Start Date End Date Taking? Authorizing Provider  ALPRAZOLAM PO Take 1 mg by mouth 3 (three) times daily as needed for anxiety (1-2 at 7pm). 12/14/20   [provider]  ASPIRIN LOW DOSE 81 MG EC tablet Take 81 mg by mouth daily. 12/31/20   [provider]  BELBUCA 750 MCG FILM Take 750 mcg by mouth every 12 (twelve) hours.    [provider]  Calcium Citrate-Vitamin D3 315-6.25 MG-MCG TABS Take 2 tablets by mouth 2 (two) times daily at 10 AM and 5 PM. 09/28/19   [provider]  cephALEXin (KEFLEX) 500 MG capsule Take 1 capsule (500 mg total) by mouth 4 (four) times daily. 03/18/23   Patricia Lutz T, DO  Continuous Blood Gluc Sensor (DEXCOM G7 SENSOR) MISC USE 1 EACH EVERY 10 DAYS    [provider]  cycloSPORINE modified (NEORAL) 25 MG capsule Take 50 mg by mouth in the morning and at bedtime. 12/16/20   [provider]  famotidine (PEPCID) 40 MG tablet Take 40 mg by mouth at bedtime.    [provider]  furosemide (LASIX) 20 MG tablet Take 40 mg by mouth daily.    [provider]  hydrocortisone (CORTEF) 5 MG tablet Take 10-20 mg by mouth See admin instructions. Take 20 mg by mouth in the morning and then take 10 mg by mouth at 3pm per patient 11/17/21   [provider]  lansoprazole (PREVACID) 15 MG capsule Take 30 mg by mouth 2 (two) times daily before Lutz meal.    [provider]  levothyroxine (SYNTHROID) 137 MCG tablet Take 137 mcg by mouth daily before breakfast.    [provider]  magnesium oxide (MAG-OX) 400 MG tablet Take 2 tablets by mouth 2 (two) times daily. 11/01/20   [provider]  mupirocin ointment (BACTROBAN) 2 % Apply 1 Application topically daily. 08/01/22   [provider]  NOVOLOG FLEXPEN 100 UNIT/ML FlexPen Inject 0-10 Units into the skin 3 (three) times daily with meals. Sliding scale    [provider]  ondansetron (ZOFRAN-ODT) 4 MG disintegrating tablet Take 4 mg by mouth every 8 (eight) hours as needed for nausea or vomiting.  04/30/19   [provider]  ondansetron (ZOFRAN-ODT) 4 MG disintegrating tablet Take 1 tablet (4 mg total) by mouth every 8 (eight) hours as needed. 03/11/23   Patricia Maroon A, PA  VEMLIDY 25 MG TABS  Take 1 tablet by mouth daily. 11/30/20   [provider]  zolpidem (AMBIEN) 10 MG tablet Take 10 mg by mouth at bedtime as needed for sleep. 01/01/21   [provider]      Allergies    Ibuprofen    Review of Systems   Review of Systems  Gastrointestinal:  Positive for abdominal pain.    Physical Exam Updated Vital Signs BP 115/85   Pulse 77   Temp 98.4 F (36.9 C) (Oral)   Resp 18   Ht 5\' 2"  (1.575 m)   Wt 83.5 kg   SpO2 98%   BMI 33.65 kg/m  Physical Exam Vitals and nursing note reviewed.  Constitutional:      General: She is not in  acute distress.    Appearance: She is not toxic-appearing.  HENT:     Head: Normocephalic and atraumatic.  Eyes:     General: No scleral icterus.    Conjunctiva/sclera: Conjunctivae normal.  Cardiovascular:     Rate and Rhythm: Normal rate and regular rhythm.     Pulses: Normal pulses.     Heart sounds: Normal heart sounds.  Pulmonary:     Effort: Pulmonary effort is normal. No respiratory distress.     Breath sounds: Normal breath sounds.  Abdominal:     General: Abdomen is flat. Bowel sounds are normal.     Palpations: Abdomen is soft.     Tenderness: There is abdominal tenderness.     Comments: Patient has tenderness to palpation upper quadrant pain however worse in right upper quadrant.  Suprapubic catheter in place without surrounding cellulitis or purulent drainage.  Catheter is draining clear urine.   Musculoskeletal:     Right lower leg: No edema.     Left lower leg: No edema.  Skin:    General: Skin is warm and dry.     Findings: No lesion.  Neurological:     General: No focal deficit present.     Mental Status: She is alert and oriented to person, place, and time. Mental status is at baseline.     ED Results / Procedures / Treatments   Labs (all labs ordered are listed, but only abnormal results are displayed) Labs Reviewed  CBC WITH DIFFERENTIAL/PLATELET - Abnormal; Notable for the following components:      Result Value   RDW 23.6 (*)    All other components within normal limits  COMPREHENSIVE METABOLIC PANEL  LIPASE, BLOOD  URINALYSIS, ROUTINE W REFLEX MICROSCOPIC  PREGNANCY, URINE    EKG None  Radiology No results found.  Procedures Procedures    Medications Ordered in ED Medications  ondansetron (ZOFRAN) injection 4 mg (4 mg Intravenous Given 04/04/23 1654)  morphine (PF) 4 MG/ML injection 4 mg (4 mg Intravenous Given 04/04/23 1655)    ED Course/ Medical Decision Making/ Lutz&P                                 Medical Decision Making Amount  and/or Complexity of Data Reviewed Labs: ordered. Radiology: ordered.  Risk Prescription drug management.   Patricia Lutz 37 y.o. presented today for abd pain. Working DDx includes, but not limited to, gastroenteritis, colitis, SBO, appendicitis, cholecystitis, hepatobiliary pathology, gastritis, PUD, ACS, dissection, pancreatitis, nephrolithiasis, AAA, UTI, pyelonephritis, ruptured ectopic pregnancy, PID, ovarian/testicular torsion.  R/o DDx: These are considered less likely than current impression due to history of present illness, physical exam, labs/imaging findings.  Review of prior external notes: Reviewed 1027 visit for similar symptoms.  Pmhx: alcohol use disorder, liver transplant, Addison's disease, chronic indwelling Foley   Unique Tests and My Interpretation:  CBC with differential: no leukocytosis, no anemia CMP: no electrolyze abnormality, no elevation in AST or ALT, normal alk phos and t.bili  Lipase: normal  UA: Trace leukocytes without bacteria urine culture is pending Urine Pregnancy: Negative   Imaging:  CT Abd/Pelvis with contrast: evaluate for structural/surgical etiology of patients' severe abdominal pain.  Considered and offered imaging patient agreed to abdominal imaging.  Abdominal imaging shows postop changes and stable biliary prominence, they do mention possible cystitis findings however patient just got over Lutz urinary tract infection and is not currently having symptoms without obvious urinalysis findings suggesting UA I will not treat, sending urinalysis for culture patient agrees and understands.   Problem List / ED Course / Critical interventions / Medication management  Patient reporting to emergency room with abdominal pain CT of the abdomen does not show any acute abdominal process.  I discussed findings and lab results with patient.  Patient does not have elevation in liver enzymes no electrolyte abnormalities or AKI.  Patient does not have leukocytosis  and does not have obvious urinary tract infection.  I am unsure what is causing abdominal pain however patient reports she had similar symptoms when she had adhesions.  Given that we see nothing on exam I recommended following up with surgeon to discuss her symptoms and for further evaluation.  Discussed pain management options including following up with her pain management doctor, I have sent lidocaine patches to pharmacy  Patient is tolerating p.o. intake has not had any episodes of vomiting while she is here.  Patient reports her symptoms have improved if she feels comfortable going home. I ordered medication including Morphine, Zofran  for abdominal pain and nausea   Reevaluation of the patient after these medicines showed that the patient improved Patients vitals assessed. Upon arrival patient is  hemodynamically stable.  I have reviewed the patients home medicines and have made adjustments as needed    Consult: None   Plan:  F/u w/ PCP in 2-3d to ensure resolution of sx.  Patient was given return precautions. Patient stable for discharge at this time.  Patient educated on current sx/dx and verbalized understanding of plan. Return to ER w/ new or worsening sx.          Final Clinical Impression(s) / ED Diagnoses Final diagnoses:  Right upper quadrant abdominal pain    Rx / DC Orders ED Discharge Orders     None         Smitty Knudsen, PA-C 04/04/23 2333    Melene Plan, DO 04/05/23 1454

## 2023-04-07 LAB — URINE CULTURE: Culture: >=100000 — AB

## 2023-04-08 ENCOUNTER — Telehealth (HOSPITAL_BASED_OUTPATIENT_CLINIC_OR_DEPARTMENT_OTHER): Payer: Self-pay | Admitting: *Deleted

## 2023-04-08 NOTE — Telephone Encounter (Signed)
Post ED Visit - Positive Culture Follow-up  Culture report reviewed by antimicrobial stewardship pharmacist: [facility] []  Enzo Bi, Pharm.D. []  Celedonio Miyamoto, Pharm.D., BCPS AQ-ID []  Garvin Fila, Pharm.D., BCPS []  Georgina Pillion, Pharm.D., BCPS []  [place], [address].D., BCPS, AAHIVP []  Estella Husk, Pharm.D., BCPS, AAHIVP []  Lysle Pearl, PharmD, BCPS []  Phillips Climes, PharmD, BCPS []  Agapito Games, PharmD, BCPS []  Verlan Friends, PharmD []  Mervyn Gay, PharmD, BCPS [x]  Lennie Muckle, PharmD  [facility] []  Len Childs, PharmD []  Greer Pickerel, PharmD []  Adalberto Cole, PharmD []  Perlie Gold, Rph []  Lonell Face) Jean Rosenthal, PharmD []  Earl Many, PharmD []  Junita Push, PharmD []  Dorna Leitz, PharmD []  Terrilee Files, PharmD []  Lynann Beaver, PharmD []  Keturah Barre, PharmD []  Loralee Pacas, PharmD []  Bernadene Person, PharmD   Positive urine culture  [facility] not to treat. No further patient follow-up is required at this time per Estelle June, DO  Patsey Berthold 04/08/2023, 8:36 AM

## 2023-04-08 NOTE — Progress Notes (Signed)
ED Antimicrobial Stewardship Positive Culture Follow Up   Patricia Lutz is an 37 y.o. female who presented to [facility] on 04/04/2023 with a chief complaint of  Chief Complaint  Patient presents with   Abdominal Pain    Recent Results (from the past 720 hour(s))  Urine Culture     Status: Abnormal   Collection Time: 04/04/23  7:00 PM   Specimen: Urine, Suprapubic  Result Value Ref Range Status   Specimen Description   Final    URINE, SUPRAPUBIC Performed at [facility], [address], [place], [place] [address]    Special Requests   Final    NONE Performed at [facility], [address], [place], [place] [address]    Culture >=100,000 COLONIES/mL ENTEROCOCCUS FAECALIS (A)  Final   Report Status 04/07/2023 FINAL  Final   Organism ID, Bacteria ENTEROCOCCUS FAECALIS (A)  Final      Susceptibility   Enterococcus faecalis - MIC*    AMPICILLIN <=2 SENSITIVE Sensitive     NITROFURANTOIN <=16 SENSITIVE Sensitive     VANCOMYCIN 2 SENSITIVE Sensitive     * >=100,000 COLONIES/mL ENTEROCOCCUS FAECALIS    [x]  Patient discharged originally without antimicrobial agent and treatment is not indicated   ED Provider: Estelle June, DO   Lennie Muckle, PharmD PGY1 Pharmacy Resident 04/08/2023 8:38 AM

## 2023-05-17 ENCOUNTER — Encounter (HOSPITAL_COMMUNITY): Payer: Self-pay

## 2023-05-17 ENCOUNTER — Emergency Department (HOSPITAL_COMMUNITY): Payer: 59

## 2023-05-17 ENCOUNTER — Other Ambulatory Visit: Payer: Self-pay

## 2023-05-17 ENCOUNTER — Emergency Department (HOSPITAL_COMMUNITY)
Admission: EM | Admit: 2023-05-17 | Discharge: 2023-05-17 | Disposition: A | Discharge status: Home / Self Care | Payer: 59 | Attending: Emergency Medicine | Admitting: Emergency Medicine

## 2023-05-17 DIAGNOSIS — N390 Urinary tract infection, site not specified: Secondary | ICD-10-CM | POA: Diagnosis not present

## 2023-05-17 DIAGNOSIS — Z7982 Long term (current) use of aspirin: Secondary | ICD-10-CM | POA: Insufficient documentation

## 2023-05-17 DIAGNOSIS — R1084 Generalized abdominal pain: Secondary | ICD-10-CM | POA: Diagnosis not present

## 2023-05-17 DIAGNOSIS — R4182 Altered mental status, unspecified: Secondary | ICD-10-CM | POA: Insufficient documentation

## 2023-05-17 DIAGNOSIS — R410 Disorientation, unspecified: Secondary | ICD-10-CM | POA: Diagnosis present

## 2023-05-17 LAB — URINALYSIS, ROUTINE W REFLEX MICROSCOPIC
Bilirubin Urine: NEGATIVE
Glucose, UA: NEGATIVE mg/dL
Ketones, ur: NEGATIVE mg/dL
Nitrite: NEGATIVE
Protein, ur: NEGATIVE mg/dL
Specific Gravity, Urine: 1.002 — ABNORMAL LOW (ref 1.005–1.030)
pH: 8 (ref 5.0–8.0)

## 2023-05-17 LAB — COMPREHENSIVE METABOLIC PANEL
ALT: 33 U/L (ref 0–44)
AST: 29 U/L (ref 15–41)
Albumin: 4 g/dL (ref 3.5–5.0)
Alkaline Phosphatase: 61 U/L (ref 38–126)
Anion gap: 10 (ref 5–15)
BUN: 12 mg/dL (ref 6–20)
CO2: 26 mmol/L (ref 22–32)
Calcium: 9.1 mg/dL (ref 8.9–10.3)
Chloride: 101 mmol/L (ref 98–111)
Creatinine, Ser: 0.9 mg/dL (ref 0.44–1.00)
GFR, Estimated: >60 mL/min (ref >60)
Glucose, Bld: 91 mg/dL (ref 70–99)
Potassium: 3.7 mmol/L (ref 3.5–5.1)
Sodium: 137 mmol/L (ref 135–145)
Total Bilirubin: 0.8 mg/dL (ref <1.2)
Total Protein: 7.2 g/dL (ref 6.5–8.1)

## 2023-05-17 LAB — CBC
HCT: 46.6 % — ABNORMAL HIGH (ref 36.0–46.0)
Hemoglobin: 15 g/dL (ref 12.0–15.0)
MCH: 30.9 pg (ref 26.0–34.0)
MCHC: 32.2 g/dL (ref 30.0–36.0)
MCV: 95.9 fL (ref 80.0–100.0)
Platelets: 160 10*3/uL (ref 150–400)
RBC: 4.86 MIL/uL (ref 3.87–5.11)
RDW: 15.9 % — ABNORMAL HIGH (ref 11.5–15.5)
WBC: 7.3 10*3/uL (ref 4.0–10.5)
nRBC: 0 % (ref 0.0–0.2)

## 2023-05-17 LAB — AMMONIA: Ammonia: 56 umol/L — ABNORMAL HIGH (ref 9–35)

## 2023-05-17 LAB — MAGNESIUM: Magnesium: 1.8 mg/dL (ref 1.7–2.4)

## 2023-05-17 LAB — LIPASE, BLOOD: Lipase: 58 U/L — ABNORMAL HIGH (ref 11–51)

## 2023-05-17 LAB — HCG, SERUM, QUALITATIVE: Preg, Serum: NEGATIVE

## 2023-05-17 LAB — I-STAT CG4 LACTIC ACID, ED: Lactic Acid, Venous: 1.3 mmol/L (ref 0.5–1.9)

## 2023-05-17 MED ORDER — CEPHALEXIN 500 MG PO CAPS: 500.0000 mg | ORAL_CAPSULE | Freq: Three times a day (TID) | ORAL | 0 refills | Status: DC

## 2023-05-17 MED ORDER — SODIUM CHLORIDE 0.9 % IV SOLN
2.0000 g | Freq: Once | INTRAVENOUS | Status: AC
  Administered 2023-05-17: 2 g via INTRAVENOUS
  Filled 2023-05-17: qty 12.5

## 2023-05-17 MED ORDER — SODIUM CHLORIDE 0.9 % IV BOLUS
1000.0000 mL | Freq: Once | INTRAVENOUS | Status: AC
  Administered 2023-05-17: 1000 mL via INTRAVENOUS

## 2023-05-17 MED ORDER — IOHEXOL 300 MG/ML  SOLN
100.0000 mL | Freq: Once | INTRAMUSCULAR | Status: AC | PRN
  Administered 2023-05-17: 100 mL via INTRAVENOUS

## 2023-05-17 NOTE — ED Triage Notes (Addendum)
Pt arrives via POV. Pt reports for the past couple of weeks she has had a lot of confusion. Pt is currently AxOx4. There is concern that her pain medications could be causing the confusion, she also is a liver transplant patient. Pt reports chronic abdominal pain. She does have a suprapubic catheter, reports recently being treated for bladder infection.

## 2023-05-17 NOTE — Progress Notes (Signed)
Pharmacy Note   A consult was received from an ED physician for cefepime per pharmacy dosing.    The patient's profile has been reviewed for ht/wt/allergies/indication/available labs.    A one time order has been placed for cefepime 2 gr IV x1 .    Further antibiotics/pharmacy consults should be ordered by admitting physician if indicated.                       Thank you,  Adalberto Cole, PharmD, BCPS 05/17/2023 12:40 PM

## 2023-05-17 NOTE — Discharge Instructions (Addendum)
You may have a urinary tract infection based on your urinalysis.  We have sent off urine culture and I have prescribed Keflex 3 times daily for a week  Please follow-up with your pain management doctor and primary care doctor  Return to ER if you have worse confusion, lethargy, hallucinations

## 2023-05-17 NOTE — ED Provider Notes (Signed)
[place] EMERGENCY DEPARTMENT AT [facility] Provider Note   CSN: [id number] Arrival date & time: 05/17/23  1123     History  Chief Complaint  Patient presents with   Abdominal Pain   Altered Mental Status    Patricia Lutz is a 37 y.o. female history of liver transplant on immunosuppressive's here presenting with abdominal pain and confusion.  Patient states that she had a liver transplant over a year ago.  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  Patient has recurrent UTIs.  She states that she has some cloudy urine right now.  She states that for the last several weeks, she has been having intermittent confusion.  She states that she would wake up at night and talk to people who were not there.  She also would try to do chores that she did already.  Patient states that she has worsening abdominal pain as well.  Patient recently had the increase of her buprenorphine by her pain management doctor.  She called her transplant doctor who sent her here for further evaluation.  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  Patient states that her liver enzymes have been normal since the transplant.  Patient also has some chills last night.  She states that she has intermittent chills and fever for several weeks as well.  The history is provided by the patient.       Home Medications Prior to Admission medications   Medication Sig Start Date End Date Taking? Authorizing Provider  ALPRAZOLAM PO Take 1 mg by mouth 3 (three) times daily as needed for anxiety (1-2 at 7pm). 12/14/20   [provider]  ASPIRIN LOW DOSE 81 MG EC tablet Take 81 mg by mouth daily. 12/31/20   [provider]  BELBUCA 750 MCG FILM Take 750 mcg by mouth every 12 (twelve) hours.    [provider]  Calcium Citrate-Vitamin D3 315-6.25 MG-MCG TABS Take 2 tablets by mouth 2 (two) times daily  at 10 AM and 5 PM. 09/28/19   [provider]  cephALEXin (KEFLEX) 500 MG capsule Take 1 capsule (500 mg total) by mouth 4 (four) times daily. 03/18/23   Anders Simmonds T, DO  Continuous Blood Gluc Sensor (DEXCOM G7 SENSOR) MISC USE 1 EACH EVERY 10 DAYS    [provider]  cycloSPORINE modified (NEORAL) 25 MG capsule Take 50 mg by mouth in the morning and at bedtime. 12/16/20   [provider]  famotidine (PEPCID) 40 MG tablet Take 40 mg by mouth at bedtime.    [provider]  furosemide (LASIX) 20 MG tablet Take 40 mg by mouth daily.    [provider]  hydrocortisone (CORTEF) 5 MG tablet Take 10-20 mg by mouth See admin instructions. Take 20 mg by mouth in the morning and then take 10 mg by mouth at 3pm per patient 11/17/21   [provider]  lansoprazole (PREVACID) 15 MG capsule Take 30 mg by mouth 2 (two) times daily before a meal.    [provider]  levothyroxine (SYNTHROID) 137 MCG tablet Take 137 mcg by mouth daily before breakfast.    [provider]  lidocaine (LIDODERM) 5 % Place 1 patch onto the skin daily. Remove & Discard patch within 12 hours or as directed by MD 04/04/23   Barrett, Horald Chestnut, PA-C  magnesium oxide (MAG-OX) 400 MG tablet Take 2 tablets by mouth  2 (two) times daily. 11/01/20   [provider]  mupirocin ointment (BACTROBAN) 2 % Apply 1 Application topically daily. 08/01/22   [provider]  NOVOLOG FLEXPEN 100 UNIT/ML FlexPen Inject 0-10 Units into the skin 3 (three) times daily with meals. Sliding scale    [provider]  ondansetron (ZOFRAN-ODT) 4 MG disintegrating tablet Take 4 mg by mouth every 8 (eight) hours as needed for nausea or vomiting.  04/30/19   [provider]  ondansetron (ZOFRAN-ODT) 4 MG disintegrating tablet Take 1 tablet (4 mg total) by mouth every 8 (eight) hours as needed. 03/11/23   Sherian Maroon A, PA  VEMLIDY 25 MG TABS Take 1 tablet by  mouth daily. 11/30/20   [provider]  zolpidem (AMBIEN) 10 MG tablet Take 10 mg by mouth at bedtime as needed for sleep. 01/01/21   [provider]      Allergies    Ibuprofen    Review of Systems   Review of Systems  Gastrointestinal:  Positive for abdominal pain.  Psychiatric/Behavioral:  Positive for confusion.   All other systems reviewed and are negative.   Physical Exam Updated Vital Signs BP 104/75   Pulse 62   Temp 98.5 F (36.9 C) (Oral)   Resp 18   Ht 5\' 2"  (1.575 m)   Wt 74.8 kg   SpO2 97%   BMI 30.18 kg/m  Physical Exam Vitals and nursing note reviewed.  Constitutional:      Comments: Chronically ill and slightly dehydrated   HENT:     Head: Normocephalic.  Eyes:     Extraocular Movements: Extraocular movements intact.     Pupils: Pupils are equal, round, and reactive to light.  Cardiovascular:     Rate and Rhythm: Normal rate and regular rhythm.     Heart sounds: Normal heart sounds.  Pulmonary:     Effort: Pulmonary effort is normal.     Breath sounds: Normal breath sounds.  Abdominal:     Comments: Distended, diffuse rash on abdomen (chronic), mild diffuse tenderness. Suprapubic catheter in place   Skin:    General: Skin is warm.     Capillary Refill: Capillary refill takes less than 2 seconds.  Neurological:     General: No focal deficit present.  Psychiatric:        Mood and Affect: Mood normal.        Behavior: Behavior normal.     ED Results / Procedures / Treatments   Labs (all labs ordered are listed, but only abnormal results are displayed) Labs Reviewed  LIPASE, BLOOD - Abnormal; Notable for the following components:      Result Value   Lipase 58 (*)    All other components within normal limits  CBC - Abnormal; Notable for the following components:   HCT 46.6 (*)    RDW 15.9 (*)    All other components within normal limits  URINALYSIS, ROUTINE W REFLEX MICROSCOPIC - Abnormal; Notable for the following  components:   Color, Urine STRAW (*)    Specific Gravity, Urine 1.002 (*)    Hgb urine dipstick SMALL (*)    Leukocytes,Ua TRACE (*)    All other components within normal limits  AMMONIA - Abnormal; Notable for the following components:   Ammonia 56 (*)    All other components within normal limits  URINE CULTURE  CULTURE, BLOOD (ROUTINE X 2)  CULTURE, BLOOD (ROUTINE X 2)  COMPREHENSIVE METABOLIC PANEL  HCG, SERUM, QUALITATIVE  MAGNESIUM  I-STAT CG4 LACTIC ACID, ED  I-STAT CG4 LACTIC ACID, ED    EKG None  Radiology CT ABDOMEN PELVIS W CONTRAST Result Date: 05/17/2023 CLINICAL DATA:  Abdominal pain, acute, nonlocalized. EXAM: CT ABDOMEN AND PELVIS WITH CONTRAST TECHNIQUE: Multidetector CT imaging of the abdomen and pelvis was performed using the standard protocol following bolus administration of intravenous contrast. RADIATION DOSE REDUCTION: This exam was performed according to the departmental dose-optimization program which includes automated exposure control, adjustment of the mA and/or kV according to patient size and/or use of iterative reconstruction technique. CONTRAST:  OMNIPAQUE IOHEXOL 300 MG/ML  SOLN COMPARISON:  CT scan abdomen and pelvis from 04/04/2023. FINDINGS: Lower chest: There are dependent changes in the visualized lung bases. No overt consolidation. No pleural effusion. The heart is normal in size. No pericardial effusion. Hepatobiliary: The liver is normal in size. Non-cirrhotic configuration. No suspicious mass. These is mild diffuse hepatic steatosis. No intrahepatic bile duct dilation. There is mild prominence of the extrahepatic bile duct, most likely due to post cholecystectomy status. Gallbladder is surgically absent. Pancreas: Unremarkable. No pancreatic ductal dilatation or surrounding inflammatory changes. Spleen: Within normal limits. No focal lesion. Adrenals/Urinary Tract: Adrenal glands are unremarkable. No suspicious renal mass. No hydronephrosis. No  renal or ureteric calculi. 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 Urinary bladder is decompressed secondary to Foley catheter. Stomach/Bowel: No disproportionate dilation of the small or large bowel loops. No evidence of abnormal bowel wall thickening or inflammatory changes. The appendix is unremarkable. Vascular/Lymphatic: No ascites or pneumoperitoneum. No abdominal or pelvic lymphadenopathy, by size criteria. No aneurysmal dilation of the major abdominal arteries. There are multiple venous collaterals near the GE junction, in the fundus/cardia of the stomach and in the left upper abdomen, suggesting sequela of portal venous hypertension. Reproductive: Anteverted uterus containing a T-shaped intrauterine device, which appears in satisfactory position. No large adnexal mass. Other: The visualized soft tissues and abdominal wall are unremarkable. Musculoskeletal: No suspicious osseous lesions. IMPRESSION: *No acute inflammatory process identified within the abdomen or pelvis. *Multiple other nonacute observations, as described above. Electronically Signed   By: Jules Schick M.D.   On: 05/17/2023 14:53   CT HEAD WO CONTRAST ( ) Result Date: 05/17/2023 CLINICAL DATA:  Mental status change, unknown cause EXAM: CT HEAD WITHOUT CONTRAST TECHNIQUE: Contiguous axial images were obtained from the base of the skull through the vertex without intravenous contrast. RADIATION DOSE REDUCTION: This exam was performed according to the departmental dose-optimization program which includes automated exposure control, adjustment of the mA and/or kV according to patient size and/or use of iterative reconstruction technique. COMPARISON:  None Available. FINDINGS: Brain: No evidence of acute infarction, hemorrhage, hydrocephalus, extra-axial collection or mass lesion/mass effect. Vascular: No hyperdense vessel or unexpected calcification. Skull: Normal. Negative for fracture or  focal lesion. Sinuses/Orbits: No middle ear or mastoid effusion. Paranasal sinuses are clear. Orbits are unremarkable Other: None. IMPRESSION: No acute intracranial abnormality. Electronically Signed   By: Lorenza Cambridge M.D.   On: 05/17/2023 13:49    Procedures Procedures    Medications Ordered in ED Medications  sodium chloride 0.9 % bolus 1,000 mL (1,000 mLs Intravenous New Bag/Given 05/17/23 1302)  ceFEPIme (MAXIPIME) 2 g in sodium chloride 0.9 % 100 mL IVPB (2 g Intravenous New Bag/Given 05/17/23 1302)  iohexol (OMNIPAQUE) 300 MG/ML solution 100 mL (100 mLs Intravenous Contrast Given 05/17/23 1316)    ED Course/ Medical Decision Making/ A&P  Medical Decision Making Mickelle Roloff is a 37 y.o. female here with abdominal pain and intermittent confusion.  This has been going on for several weeks.  Consider hepatic encephalopathy versus sepsis from UTI.  Patient had chills yesterday.  Will do sepsis workup with CBC and CMP and lactate and cultures.  Will also get CT abdomen pelvis as well.  Will give empiric abx for UTI   3:12 PM I reviewed patient's labs and independently interpreted CT scans.  White blood cell count is normal.  Patient's ammonia is 56 which is close to her baseline of 45.  UA showed questionable UTI.  CT abdomen pelvis and CT head unremarkable.  Vitals are stable.  It is unclear why she has these intermittent confusion episodes.  I do not think her ammonia level is high enough to explain this.  Her baseline level is around 45 and she is on lactulose already.  She was given cefepime for possible UTI.  She has a suprapubic catheter so I wonder if she has colonization versus UTI.  She will be discharged home with course of Keflex.  Urine culture was sent.  Problems Addressed: Confusion: acute illness or injury Urinary tract infection without hematuria, site unspecified: acute illness or injury  Amount and/or Complexity of Data Reviewed Labs:  ordered. Decision-making details documented in ED Course. Radiology: ordered and independent interpretation performed. Decision-making details documented in ED Course.  Risk Prescription drug management.    Final Clinical Impression(s) / ED Diagnoses Final diagnoses:  None    Rx / DC Orders ED Discharge Orders     None         Charlynne Pander, MD 05/17/23 1514

## 2023-05-20 LAB — URINE CULTURE: Culture: >=100000 — AB

## 2023-05-21 ENCOUNTER — Telehealth (HOSPITAL_BASED_OUTPATIENT_CLINIC_OR_DEPARTMENT_OTHER): Payer: Self-pay

## 2023-05-21 NOTE — Telephone Encounter (Signed)
 Post ED Visit - Positive Culture Follow-up  Culture report reviewed by antimicrobial stewardship pharmacist: [facility] []  Rankin Dee, Pharm.D. []  Venetia Gully, Pharm.D., BCPS AQ-ID []  Garrel Crews, Pharm.D., BCPS []  Almarie Lunger, [address].D., BCPS []  [place], [address].D., BCPS, AAHIVP []  Rosaline Bihari, Pharm.D., BCPS, AAHIVP []  Vernell Meier, PharmD, BCPS []  Latanya Hint, PharmD, BCPS []  Donald Medley, PharmD, BCPS []  Rocky Bold, PharmD []  Dorothyann Alert, PharmD, BCPS []  Morene Babe, PharmD  [facility] [x]  Damien Quiet, PharmD []  Romona Bliss, PharmD []  Dolphus Roller, PharmD []  Veva Seip, Rph []  Vernell Daunt) Leonce, PharmD []  Eva Allis, PharmD []  Rosaline Millet, PharmD []  Iantha Batch, PharmD []  Arvin Gauss, PharmD []  Wanda Hasting, PharmD []  Ronal Rav, PharmD []  Rocky Slade, PharmD []  Bard Jeans, PharmD   Positive urine culture Reviewed by ED provider : Rolan Quale, MD  Treated with Cephalexin . 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 and no further patient follow-up is required at this time.  Patricia Lutz 05/21/2023, 8:59 AM

## 2023-05-22 LAB — CULTURE, BLOOD (ROUTINE X 2)
Culture: NO GROWTH
Culture: NO GROWTH
Special Requests: ADEQUATE
Special Requests: ADEQUATE

## 2023-05-31 ENCOUNTER — Emergency Department (HOSPITAL_BASED_OUTPATIENT_CLINIC_OR_DEPARTMENT_OTHER): Payer: PRIVATE HEALTH INSURANCE

## 2023-05-31 ENCOUNTER — Other Ambulatory Visit: Payer: Self-pay

## 2023-05-31 ENCOUNTER — Encounter (HOSPITAL_BASED_OUTPATIENT_CLINIC_OR_DEPARTMENT_OTHER): Payer: Self-pay | Admitting: Emergency Medicine

## 2023-05-31 ENCOUNTER — Emergency Department (HOSPITAL_BASED_OUTPATIENT_CLINIC_OR_DEPARTMENT_OTHER)
Admission: EM | Admit: 2023-05-31 | Discharge: 2023-05-31 | Disposition: A | Discharge status: Home / Self Care | Payer: PRIVATE HEALTH INSURANCE | Attending: Emergency Medicine | Admitting: Emergency Medicine

## 2023-05-31 DIAGNOSIS — J069 Acute upper respiratory infection, unspecified: Secondary | ICD-10-CM | POA: Diagnosis not present

## 2023-05-31 DIAGNOSIS — E119 Type 2 diabetes mellitus without complications: Secondary | ICD-10-CM | POA: Insufficient documentation

## 2023-05-31 DIAGNOSIS — D696 Thrombocytopenia, unspecified: Secondary | ICD-10-CM

## 2023-05-31 DIAGNOSIS — H938X3 Other specified disorders of ear, bilateral: Secondary | ICD-10-CM | POA: Diagnosis not present

## 2023-05-31 DIAGNOSIS — Z20822 Contact with and (suspected) exposure to covid-19: Secondary | ICD-10-CM | POA: Diagnosis not present

## 2023-05-31 DIAGNOSIS — Z7982 Long term (current) use of aspirin: Secondary | ICD-10-CM | POA: Diagnosis not present

## 2023-05-31 DIAGNOSIS — R059 Cough, unspecified: Secondary | ICD-10-CM | POA: Diagnosis present

## 2023-05-31 LAB — RESP PANEL BY RT-PCR (RSV, FLU A&B, COVID)  RVPGX2
Influenza A by PCR: NEGATIVE
Influenza B by PCR: NEGATIVE
Resp Syncytial Virus by PCR: NEGATIVE
SARS Coronavirus 2 by RT PCR: NEGATIVE

## 2023-05-31 LAB — CBC WITH DIFFERENTIAL/PLATELET
Abs Immature Granulocytes: 0.01 10*3/uL (ref 0.00–0.07)
Basophils Absolute: 0 10*3/uL (ref 0.0–0.1)
Basophils Relative: 0 %
Eosinophils Absolute: 0.1 10*3/uL (ref 0.0–0.5)
Eosinophils Relative: 1 %
HCT: 41.5 % (ref 36.0–46.0)
Hemoglobin: 13.9 g/dL (ref 12.0–15.0)
Immature Granulocytes: 0 %
Lymphocytes Relative: 15 %
Lymphs Abs: 0.8 10*3/uL (ref 0.7–4.0)
MCH: 31.7 pg (ref 26.0–34.0)
MCHC: 33.5 g/dL (ref 30.0–36.0)
MCV: 94.5 fL (ref 80.0–100.0)
Monocytes Absolute: 0.8 10*3/uL (ref 0.1–1.0)
Monocytes Relative: 14 %
Neutro Abs: 3.8 10*3/uL (ref 1.7–7.7)
Neutrophils Relative %: 70 %
Platelets: 104 10*3/uL — ABNORMAL LOW (ref 150–400)
RBC: 4.39 MIL/uL (ref 3.87–5.11)
RDW: 14.2 % (ref 11.5–15.5)
WBC: 5.5 10*3/uL (ref 4.0–10.5)
nRBC: 0 % (ref 0.0–0.2)

## 2023-05-31 LAB — BASIC METABOLIC PANEL
Anion gap: 7 (ref 5–15)
BUN: 14 mg/dL (ref 6–20)
CO2: 30 mmol/L (ref 22–32)
Calcium: 9.1 mg/dL (ref 8.9–10.3)
Chloride: 102 mmol/L (ref 98–111)
Creatinine, Ser: 0.88 mg/dL (ref 0.44–1.00)
GFR, Estimated: >60 mL/min (ref >60)
Glucose, Bld: 85 mg/dL (ref 70–99)
Potassium: 3.7 mmol/L (ref 3.5–5.1)
Sodium: 139 mmol/L (ref 135–145)

## 2023-05-31 LAB — GROUP A STREP BY PCR: Group A Strep by PCR: NOT DETECTED

## 2023-05-31 LAB — MAGNESIUM: Magnesium: 1.7 mg/dL (ref 1.7–2.4)

## 2023-05-31 NOTE — ED Triage Notes (Signed)
 Pt c/o shob, cough and sore throat,HA and CP starting yesterday. Hoarseness noted.

## 2023-05-31 NOTE — ED Notes (Signed)
 Recollect lav top sent to lab.

## 2023-05-31 NOTE — ED Provider Notes (Signed)
 [place] EMERGENCY DEPARTMENT AT [facility] Provider Note   CSN: [id number] Arrival date & time: 05/31/23  1308     History  Chief Complaint  Patient presents with   Shortness of Breath    Patricia Lutz is a 38 y.o. female.  Patient with history of liver transplant, chronic immunosuppression followed at [facility], diabetes, chronic indwelling suprapubic catheter --presents emergency department today for evaluation of cough and sore throat.  Patient had labs drawn 2 days ago.  Around this time she developed a sore throat which was mild.  This has progressed.  Patient has had subjective low-grade fever at home.  No vomiting or diarrhea.  Cough is mildly productive and associated with some exertional shortness of breath.  She also has a hoarse voice.  No ear pain.  She has had some nasal congestion.  No distinct sick contacts.  She continues to take her medications.         Home Medications Prior to Admission medications   Medication Sig Start Date End Date Taking? Authorizing Provider  ALPRAZOLAM  PO Take 1 mg by mouth 3 (three) times daily as needed for anxiety (1-2 at 7pm). 12/14/20   [provider]  ASPIRIN  LOW DOSE 81 MG EC tablet Take 81 mg by mouth daily. 12/31/20   [provider]  BELBUCA  750 MCG FILM Take 750 mcg by mouth every 12 (twelve) hours.    [provider]  Calcium Citrate-Vitamin D3 315-6.25 MG-MCG TABS Take 2 tablets by mouth 2 (two) times daily at 10 AM and 5 PM. 09/28/19   [provider]  cephALEXin  (KEFLEX ) 500 MG capsule Take 1 capsule (500 mg total) by mouth 3 (three) times daily. 05/17/23   Patt Alm Macho, MD  Continuous Blood Gluc Sensor (DEXCOM G7 SENSOR) MISC USE 1 EACH EVERY 10 DAYS    [provider]  cycloSPORINE  modified (NEORAL ) 25 MG capsule Take 50 mg by mouth in the morning and at bedtime. 12/16/20   [provider]  famotidine  (PEPCID ) 40 MG tablet Take 40 mg by mouth at bedtime.     [provider]  furosemide  (LASIX ) 20 MG tablet Take 40 mg by mouth daily.    [provider]  hydrocortisone  (CORTEF ) 5 MG tablet Take 10-20 mg by mouth See admin instructions. Take 20 mg by mouth in the morning and then take 10 mg by mouth at 3pm per patient 11/17/21   [provider]  lansoprazole (PREVACID) 15 MG capsule Take 30 mg by mouth 2 (two) times daily before a meal.    [provider]  levothyroxine  (SYNTHROID ) 137 MCG tablet Take 137 mcg by mouth daily before breakfast.    [provider]  lidocaine  (LIDODERM ) 5 % Place 1 patch onto the skin daily. Remove & Discard patch within 12 hours or as directed by MD 04/04/23   Barrett, Jamie N, PA-C  magnesium  oxide (MAG-OX) 400 MG tablet Take 2 tablets by mouth 2 (two) times daily. 11/01/20   [provider]  mupirocin ointment (BACTROBAN) 2 % Apply 1 Application topically daily. 08/01/22   [provider]  NOVOLOG  FLEXPEN 100 UNIT/ML FlexPen Inject 0-10 Units into the skin 3 (three) times daily with meals. Sliding scale    [provider]  ondansetron  (ZOFRAN -ODT) 4 MG disintegrating tablet Take 4 mg by mouth every 8 (eight) hours as needed for nausea or vomiting.  04/30/19   [provider]  ondansetron  (ZOFRAN -ODT) 4 MG disintegrating tablet Take 1 tablet (  4 mg total) by mouth every 8 (eight) hours as needed. 03/11/23   Silver Fell A, PA  VEMLIDY  25 MG TABS Take 1 tablet by mouth daily. 11/30/20   [provider]  zolpidem (AMBIEN) 10 MG tablet Take 10 mg by mouth at bedtime as needed for sleep. 01/01/21   [provider]      Allergies    Ibuprofen    Review of Systems   Review of Systems  Physical Exam Updated Vital Signs BP 114/67   Pulse 75   Temp 98.3 F (36.8 C)   Resp 18   Wt 82.6 kg   SpO2 100%   BMI 33.29 kg/m  Physical Exam Vitals and nursing note reviewed.  Constitutional:      Appearance: She is well-developed.   HENT:     Head: Normocephalic and atraumatic.     Jaw: No trismus.     Right Ear: Ear canal and external ear normal. A middle ear effusion is present.     Left Ear: Ear canal and external ear normal. A middle ear effusion is present.     Ears:     Comments: Slight air-fluid levels noted behind each TM, no erythema or bulging    Nose: Nose normal. No mucosal edema or rhinorrhea.     Mouth/Throat:     Mouth: Mucous membranes are moist. Mucous membranes are not dry. No oral lesions.     Pharynx: Uvula midline. No oropharyngeal exudate, posterior oropharyngeal erythema or uvula swelling.     Tonsils: No tonsillar abscesses.     Comments: Voice is hoarse, but no stridor or difficulty breathing Eyes:     General:        Right eye: No discharge.        Left eye: No discharge.     Conjunctiva/sclera: Conjunctivae normal.  Cardiovascular:     Rate and Rhythm: Normal rate and regular rhythm.     Heart sounds: Normal heart sounds.  Pulmonary:     Effort: Pulmonary effort is normal. No respiratory distress.     Breath sounds: Rhonchi present. No wheezing or rales.     Comments: Mild scattered rhonchi, no increased work of breathing Abdominal:     Palpations: Abdomen is soft.     Tenderness: There is no abdominal tenderness.  Musculoskeletal:     Cervical back: Normal range of motion and neck supple.  Lymphadenopathy:     Cervical: No cervical adenopathy.  Skin:    General: Skin is warm and dry.  Neurological:     Mental Status: She is alert.  Psychiatric:        Mood and Affect: Mood normal.     ED Results / Procedures / Treatments   Labs (all labs ordered are listed, but only abnormal results are displayed) Labs Reviewed  CBC WITH DIFFERENTIAL/PLATELET - Abnormal; Notable for the following components:      Result Value   Platelets 104 (*)    All other components within normal limits  GROUP A STREP BY PCR  RESP PANEL BY RT-PCR (RSV, FLU A&B, COVID)  [id number]  BASIC METABOLIC  PANEL  MAGNESIUM   CBC WITH DIFFERENTIAL/PLATELET    EKG None  Radiology No results found.  Procedures Procedures    Medications Ordered in ED Medications - No data to display  ED Course/ Medical Decision Making/ A&P    Patient seen and examined. History obtained directly from patient.  Reviewed labs from 2 days ago.  Was found to  have thrombocytopenia at that time, otherwise largely unremarkable.  Cyclosporine  level low.Reviewed recent notes from [facility] as well as ED visit and labs here on 12/27.    Labs/EKG: Ordered CBC, BMP, strep, viral panel.  Imaging: Ordered chest x-ray.  Medications/Fluids: Ordered: None ordered.   Most recent vital signs reviewed and are as follows: BP 114/67   Pulse 75   Temp 98.3 F (36.8 C)   Resp 18   Wt 82.6 kg   SpO2 100%   BMI 33.29 kg/m   Initial impression: Viral respiratory infection, laryngitis.  Urine is yellow in color, recently exchanged tube yesterday.  No abdominal pain and overall low concern for UTI.  3:39 PM Reassessment performed. Patient appears stable.  No respiratory distress.  Labs personally reviewed and interpreted including: CBC with thrombocytopenia although mildly improved from labs performed at [facility] 2 days ago, BMP unremarkable; viral panel negative; magnesium  normal; strep test negative.  Reviewed pertinent lab work and imaging with patient at bedside. Questions answered.   Most current vital signs reviewed and are as follows: BP 114/67   Pulse 75   Temp 98.3 F (36.8 C)   Resp 18   Wt 82.6 kg   SpO2 100%   BMI 33.29 kg/m   Plan: Discharge to home.   Prescriptions written for: None  Other home care instructions discussed: Rest, hydration, OTC meds.  Patient is aware of Tylenol  limits prescribed by her doctor and other medications to avoid given her history of liver transplant.  ED return instructions discussed: Fever, worsening shortness of breath or trouble breathing, vomiting  Follow-up  instructions discussed: Patient encouraged to follow-up with their PCP in 3 days.                                   Medical Decision Making Amount and/or Complexity of Data Reviewed Labs: ordered. Radiology: ordered.   Patient with history of immunocompromise due to liver transplant.  Clinically she has an upper respiratory infection and laryngitis.  Bacterial sources were evaluated for today including pneumonia, strep throat.  Chest x-ray was clear.  Viral panel was also negative.  No indications for Tamiflu or Paxlovid.  Patient stable during ED stay.  She is tolerating p.o.'s.  She has been afebrile.  At this point we will manage expectantly with symptom control.  Discussed need to return with worsening symptoms and follow-up closely as an outpatient with her primary care doctor given that she is high risk due to her comorbidities.  She verbalizes understanding and agrees with plan.  The patient's vital signs, pertinent lab work and imaging were reviewed and interpreted as discussed in the ED course. Hospitalization was considered for further testing, treatments, or serial exams/observation. However as patient is well-appearing, has a stable exam, and reassuring studies today, I do not feel that they warrant admission at this time. This plan was discussed with the patient who verbalizes agreement and comfort with this plan and seems reliable and able to return to the Emergency Department with worsening or changing symptoms.          Final Clinical Impression(s) / ED Diagnoses Final diagnoses:  Upper respiratory tract infection, unspecified type  Thrombocytopenia [facility])    Rx / DC Orders ED Discharge Orders     None         Desiderio Chew, PA-C 05/31/23 1541    Dean Clarity, MD 06/03/23 [phone]

## 2023-05-31 NOTE — Discharge Instructions (Signed)
 Please read and follow all provided instructions.  Your diagnoses today include:  1. Upper respiratory tract infection, unspecified type   2. Thrombocytopenia (HCC)     You appear to have an upper respiratory infection (URI). An upper respiratory tract infection, or cold, is a viral infection of the air passages leading to the lungs. It should improve gradually after 5-7 days. You may have a lingering cough that lasts for 2- 4 weeks after the infection.  Tests performed today include: Vital signs. See below for your results today.  Complete blood cell count: Normal white blood cells and red blood cells, platelets are a bit low but improved from previous lab work performed 2 days ago Complete metabolic panel: Normal liver function and kidney function testing Magnesium : Was normal Viral panel: Negative for flu, COVID, RSV Strep test: Negative Pregnancy test (urine or blood, in women only):  Medications prescribed:  None  Take any prescribed medications only as directed. Treatment for your infection is aimed at treating the symptoms. There are no medications, such as antibiotics, that will cure your infection.   Home care instructions:  You can take Tylenol  and/or Ibuprofen with limits established by your doctor on the packaging for fever reduction and pain relief.    For cough: honey 1/2 to 1 teaspoon (you can dilute the honey in water or another fluid).  You can also use guaifenesin and dextromethorphan for cough. You can use a humidifier for chest congestion and cough.  If you don't have a humidifier, you can sit in the bathroom with the hot shower running.      For sore throat: try warm salt water gargles, cepacol lozenges, throat spray, warm tea or water with lemon/honey, popsicles or ice, or OTC cold relief medicine for throat discomfort.    For congestion: take a daily anti-histamine like Zyrtec, Claritin, and a oral decongestant, such as pseudoephedrine.  You can also use Flonase  1-2 sprays in each nostril daily.    It is important to stay hydrated: drink plenty of fluids (water, gatorade/powerade/pedialyte, juices, or teas) to keep your throat moisturized and help further relieve irritation/discomfort.   Your illness is contagious and can be spread to others, especially during the first 3 or 4 days. It cannot be cured by antibiotics or other medicines. Take basic precautions such as washing your hands often, covering your mouth when you cough or sneeze, and avoiding public places where you could spread your illness to others.   Please continue drinking plenty of fluids.  Use over-the-counter medicines as needed as directed on packaging for symptom relief.  You may also use ibuprofen or tylenol  as directed on packaging for pain or fever.  Do not take multiple medicines containing Tylenol  or acetaminophen  to avoid taking too much of this medication.  Follow-up instructions: Please follow-up with your primary care provider in the next 3 days for further evaluation of your symptoms if you are not feeling better.   Return instructions:  Please return to the Emergency Department if you experience worsening symptoms.  RETURN IMMEDIATELY IF you develop shortness of breath, confusion or altered mental status, a new rash, become dizzy, faint, or poorly responsive, or are unable to be cared for at home. Please return if you have persistent vomiting and cannot keep down fluids or develop a fever that is not controlled by tylenol  or motrin.   Please return if you have any other emergent concerns.  Additional Information:  Your vital signs today were: BP 114/67  Pulse 75   Temp 98.3 F (36.8 C)   Resp 18   Wt 82.6 kg   SpO2 100%   BMI 33.29 kg/m  If your blood pressure (BP) was elevated above 135/85 this visit, please have this repeated by your doctor within one month. --------------

## 2023-05-31 NOTE — ED Notes (Signed)
 ED Provider at bedside.

## 2023-06-27 ENCOUNTER — Emergency Department (HOSPITAL_BASED_OUTPATIENT_CLINIC_OR_DEPARTMENT_OTHER)
Admission: EM | Admit: 2023-06-27 | Discharge: 2023-06-27 | Disposition: A | Discharge status: Home / Self Care | Payer: Self-pay | Attending: Emergency Medicine | Admitting: Emergency Medicine

## 2023-06-27 ENCOUNTER — Other Ambulatory Visit: Payer: Self-pay

## 2023-06-27 ENCOUNTER — Encounter (HOSPITAL_BASED_OUTPATIENT_CLINIC_OR_DEPARTMENT_OTHER): Payer: Self-pay | Admitting: Emergency Medicine

## 2023-06-27 ENCOUNTER — Emergency Department (HOSPITAL_BASED_OUTPATIENT_CLINIC_OR_DEPARTMENT_OTHER): Payer: Self-pay

## 2023-06-27 DIAGNOSIS — Z79899 Other long term (current) drug therapy: Secondary | ICD-10-CM | POA: Insufficient documentation

## 2023-06-27 DIAGNOSIS — R1011 Right upper quadrant pain: Secondary | ICD-10-CM | POA: Insufficient documentation

## 2023-06-27 DIAGNOSIS — Z7982 Long term (current) use of aspirin: Secondary | ICD-10-CM | POA: Diagnosis not present

## 2023-06-27 DIAGNOSIS — E039 Hypothyroidism, unspecified: Secondary | ICD-10-CM | POA: Diagnosis not present

## 2023-06-27 DIAGNOSIS — Z944 Liver transplant status: Secondary | ICD-10-CM | POA: Diagnosis not present

## 2023-06-27 DIAGNOSIS — R1010 Upper abdominal pain, unspecified: Secondary | ICD-10-CM

## 2023-06-27 DIAGNOSIS — E119 Type 2 diabetes mellitus without complications: Secondary | ICD-10-CM | POA: Insufficient documentation

## 2023-06-27 DIAGNOSIS — R1012 Left upper quadrant pain: Secondary | ICD-10-CM | POA: Insufficient documentation

## 2023-06-27 LAB — COMPREHENSIVE METABOLIC PANEL
ALT: 19 U/L (ref 0–44)
AST: 35 U/L (ref 15–41)
Albumin: 4.3 g/dL (ref 3.5–5.0)
Alkaline Phosphatase: 59 U/L (ref 38–126)
Anion gap: 6 (ref 5–15)
BUN: 9 mg/dL (ref 6–20)
CO2: 29 mmol/L (ref 22–32)
Calcium: 9.1 mg/dL (ref 8.9–10.3)
Chloride: 99 mmol/L (ref 98–111)
Creatinine, Ser: 0.85 mg/dL (ref 0.44–1.00)
GFR, Estimated: >60 mL/min (ref >60)
Glucose, Bld: 81 mg/dL (ref 70–99)
Potassium: 4.2 mmol/L (ref 3.5–5.1)
Sodium: 134 mmol/L — ABNORMAL LOW (ref 135–145)
Total Bilirubin: 1.4 mg/dL — ABNORMAL HIGH (ref 0.0–1.2)
Total Protein: 7 g/dL (ref 6.5–8.1)

## 2023-06-27 LAB — URINALYSIS, ROUTINE W REFLEX MICROSCOPIC
Bacteria, UA: NONE SEEN
Bilirubin Urine: NEGATIVE
Glucose, UA: NEGATIVE mg/dL
Hgb urine dipstick: NEGATIVE
Ketones, ur: NEGATIVE mg/dL
Nitrite: NEGATIVE
Protein, ur: NEGATIVE mg/dL
Specific Gravity, Urine: <1.005 — ABNORMAL LOW (ref 1.005–1.030)
pH: 7.5 (ref 5.0–8.0)

## 2023-06-27 LAB — CBC
HCT: 42.9 % (ref 36.0–46.0)
Hemoglobin: 14.5 g/dL (ref 12.0–15.0)
MCH: 32.2 pg (ref 26.0–34.0)
MCHC: 33.8 g/dL (ref 30.0–36.0)
MCV: 95.3 fL (ref 80.0–100.0)
Platelets: 137 10*3/uL — ABNORMAL LOW (ref 150–400)
RBC: 4.5 MIL/uL (ref 3.87–5.11)
RDW: 13 % (ref 11.5–15.5)
WBC: 7.6 10*3/uL (ref 4.0–10.5)
nRBC: 0 % (ref 0.0–0.2)

## 2023-06-27 LAB — PREGNANCY, URINE: Preg Test, Ur: NEGATIVE

## 2023-06-27 LAB — LIPASE, BLOOD: Lipase: 21 U/L (ref 11–51)

## 2023-06-27 MED ORDER — IOHEXOL 300 MG/ML  SOLN
100.0000 mL | Freq: Once | INTRAMUSCULAR | Status: AC | PRN
  Administered 2023-06-27: 85 mL via INTRAVENOUS

## 2023-06-27 MED ORDER — HYDROMORPHONE HCL 1 MG/ML IJ SOLN
1.0000 mg | Freq: Once | INTRAMUSCULAR | Status: AC
  Administered 2023-06-27: 1 mg via INTRAVENOUS
  Filled 2023-06-27: qty 1

## 2023-06-27 NOTE — ED Notes (Signed)
 Pt at CT, unable to update vitals at this time.

## 2023-06-27 NOTE — ED Triage Notes (Addendum)
 Pt arrived from home with c/o acute LUQ/RUQ abdominal pain, radiating to her back for a few days. Pt immunocompromised, sees pain management clinic, hx of liver transplant, suprapubic catheter.

## 2023-06-27 NOTE — Discharge Instructions (Signed)
 Follow-up with your pain management and liver transplant doctors.  Return to the emergency room if you have any worsening symptoms.

## 2023-06-27 NOTE — ED Provider Notes (Signed)
 Care was taken over from Dr. Zackowski.  Patient is status post liver transplant and is followed with [facility].  She also has chronic abdominal pain and is followed by pain management.  She presents with exacerbation of her recurrent abdominal pain.  Her bilirubin is minimally elevated.  CT scan does not show any acute abnormality.  She has been given IV fluids and pain management in the ED is overall feeling better.  She does not have any suggestions of obstruction or biliary obstruction.  No other elevated LFTs.  No fever or other signs of infection.  She is tolerating oral fluids ongoing nausea and vomiting.  She feels like she is ready to go home.  She was discharged home in good condition.  Will follow-up with her pain management and transplant doctors.  Return precautions were given. Physical Exam  BP (!) 103/51 (BP Location: Left Arm)   Pulse (!) 58   Temp 97.9 F (36.6 C) (Oral) Comment: Pt drank ice water  Resp 16   Ht 5' 1 (1.549 m)   Wt 82.6 kg   SpO2 100%   BMI 34.39 kg/m   Physical Exam Patient is alert, interactive, nontoxic-appearing.  Benign abdominal exam Procedures  Procedures  ED Course / MDM    Medical Decision Making Amount and/or Complexity of Data Reviewed Labs: ordered. Radiology: ordered.  Risk Prescription drug management.          Lenor Hollering, MD 06/27/23 [phone]

## 2023-06-27 NOTE — ED Provider Notes (Signed)
 [facility] AT [facility] Provider Note   CSN: [id number] Arrival date & time: 06/27/23  1016     History  Chief Complaint  Patient presents with   Abdominal Pain    Patricia Lutz is a 38 y.o. female.  Patient here with a complaint of left upper quadrant right upper quadrant abdominal pain that radiates to the back.  He got worse the past 2 days.  Patient has chronic abdominal pain in that area.  History of liver transplant has a suprapubic catheter.  Patient is followed by [facility] regarding the transplant.  Is immune O compromised and taking medications.  Also followed by pain management clinic.  Past medical history sniffer hypothyroidism Hashimoto's disease reflux disease cirrhosis past alcohol abuse diabetes.       Home Medications Prior to Admission medications   Medication Sig Start Date End Date Taking? Authorizing Provider  ALPRAZOLAM  PO Take 1 mg by mouth 3 (three) times daily as needed for anxiety (1-2 at 7pm). 12/14/20   [provider]  ASPIRIN  LOW DOSE 81 MG EC tablet Take 81 mg by mouth daily. 12/31/20   [provider]  BELBUCA  750 MCG FILM Take 750 mcg by mouth every 12 (twelve) hours.    [provider]  Calcium Citrate-Vitamin D3 315-6.25 MG-MCG TABS Take 2 tablets by mouth 2 (two) times daily at 10 AM and 5 PM. 09/28/19   [provider]  cephALEXin  (KEFLEX ) 500 MG capsule Take 1 capsule (500 mg total) by mouth 3 (three) times daily. 05/17/23   Patt Alm Macho, MD  Continuous Blood Gluc Sensor (DEXCOM G7 SENSOR) MISC USE 1 EACH EVERY 10 DAYS    [provider]  cycloSPORINE  modified (NEORAL ) 25 MG capsule Take 50 mg by mouth in the morning and at bedtime. 12/16/20   [provider]  famotidine  (PEPCID ) 40 MG tablet Take 40 mg by mouth at bedtime.    [provider]  furosemide  (LASIX ) 20 MG tablet Take 40 mg by mouth daily.    [provider]  hydrocortisone  (CORTEF ) 5  MG tablet Take 10-20 mg by mouth See admin instructions. Take 20 mg by mouth in the morning and then take 10 mg by mouth at 3pm per patient 11/17/21   [provider]  lansoprazole (PREVACID) 15 MG capsule Take 30 mg by mouth 2 (two) times daily before a meal.    [provider]  levothyroxine  (SYNTHROID ) 137 MCG tablet Take 137 mcg by mouth daily before breakfast.    [provider]  lidocaine  (LIDODERM ) 5 % Place 1 patch onto the skin daily. Remove & Discard patch within 12 hours or as directed by MD 04/04/23   Barrett, Jamie N, PA-C  magnesium  oxide (MAG-OX) 400 MG tablet Take 2 tablets by mouth 2 (two) times daily. 11/01/20   [provider]  mupirocin ointment (BACTROBAN) 2 % Apply 1 Application topically daily. 08/01/22   [provider]  NOVOLOG  FLEXPEN 100 UNIT/ML FlexPen Inject 0-10 Units into the skin 3 (three) times daily with meals. Sliding scale    [provider]  ondansetron  (ZOFRAN -ODT) 4 MG disintegrating tablet Take 4 mg by mouth every 8 (eight) hours as needed for nausea or vomiting.  04/30/19   [provider]  ondansetron  (ZOFRAN -ODT) 4 MG disintegrating tablet Take 1 tablet (4 mg total) by mouth every 8 (eight) hours as needed. 03/11/23   Silver Wonda LABOR, PA  VEMLIDY  25 MG TABS Take 1 tablet by  mouth daily. 11/30/20   [provider]  zolpidem (AMBIEN) 10 MG tablet Take 10 mg by mouth at bedtime as needed for sleep. 01/01/21   [provider]      Allergies    Ibuprofen    Review of Systems   Review of Systems  Constitutional:  Negative for chills and fever.  HENT:  Negative for ear pain and sore throat.   Eyes:  Negative for pain and visual disturbance.  Respiratory:  Negative for cough and shortness of breath.   Cardiovascular:  Negative for chest pain and palpitations.  Gastrointestinal:  Positive for abdominal pain. Negative for vomiting.  Genitourinary:  Negative for dysuria and hematuria.   Musculoskeletal:  Negative for arthralgias and back pain.  Skin:  Negative for color change and rash.  Neurological:  Negative for seizures and syncope.  All other systems reviewed and are negative.   Physical Exam Updated Vital Signs BP 115/70   Pulse 63   Temp 98.2 F (36.8 C) (Oral)   Resp 17   Ht 1.549 m (5' 1)   Wt 82.6 kg   SpO2 100%   BMI 34.39 kg/m  Physical Exam Vitals and nursing note reviewed.  Constitutional:      General: She is not in acute distress.    Appearance: She is well-developed.  HENT:     Head: Normocephalic and atraumatic.  Eyes:     Extraocular Movements: Extraocular movements intact.     Conjunctiva/sclera: Conjunctivae normal.     Pupils: Pupils are equal, round, and reactive to light.  Cardiovascular:     Rate and Rhythm: Normal rate and regular rhythm.     Heart sounds: No murmur heard. Pulmonary:     Effort: Pulmonary effort is normal. No respiratory distress.     Breath sounds: Normal breath sounds.  Abdominal:     Palpations: Abdomen is soft.     Tenderness: There is abdominal tenderness.     Comments: Some mild tenderness upper quadrant.  Musculoskeletal:        General: No swelling.     Cervical back: Normal range of motion and neck supple.  Skin:    General: Skin is warm and dry.     Capillary Refill: Capillary refill takes less than 2 seconds.  Neurological:     General: No focal deficit present.     Mental Status: She is alert and oriented to person, place, and time.  Psychiatric:        Mood and Affect: Mood normal.     ED Results / Procedures / Treatments   Labs (all labs ordered are listed, but only abnormal results are displayed) Labs Reviewed  COMPREHENSIVE METABOLIC PANEL - Abnormal; Notable for the following components:      Result Value   Sodium 134 (*)    Total Bilirubin 1.4 (*)    All other components within normal limits  CBC - Abnormal; Notable for the following components:   Platelets 137 (*)    All  other components within normal limits  URINALYSIS, ROUTINE W REFLEX MICROSCOPIC - Abnormal; Notable for the following components:   Color, Urine COLORLESS (*)    Specific Gravity, Urine <1.005 (*)    Leukocytes,Ua TRACE (*)    All other components within normal limits  LIPASE, BLOOD  PREGNANCY, URINE    EKG None  Radiology No results found.  Procedures Procedures    Medications Ordered in ED Medications  HYDROmorphone  (DILAUDID ) injection 1 mg (1 mg Intravenous  Given 06/27/23 1240)    ED Course/ Medical Decision Making/ A&P                                 Medical Decision Making Amount and/or Complexity of Data Reviewed Labs: ordered. Radiology: ordered.  Risk Prescription drug management.   Will get CT scan abdomen and pelvis for evaluation of the abdominal pain.  Patient's lipase is normal complete metabolic panel normal other than total bili at 1.4.  Renal functions normal.  Alk phos is normal.  Patient states that usually when she needs ERCP her bilirubin is above 1.8.  CBC white count 7.6 hemoglobin 14.5.  Patient also have low platelets but they are pretty good today at 137.  Urinalysis here not consistent with urinary tract infection.  Pregnancy test negative.  Patient requesting some pain medicine will give some IV hydromorphone .  Patient does have pain medications available at home is followed by pain management.  Disposition will be based on CT scan.  Patient appears nontoxic no acute distress.  Temp was 98.2 pulse 67 blood pressure 106/75.     Final Clinical Impression(s) / ED Diagnoses Final diagnoses:  Pain of upper abdomen    Rx / DC Orders ED Discharge Orders     None         Geraldene Hamilton, MD 06/27/23 1243

## 2023-06-27 NOTE — ED Notes (Addendum)
 Pt alert and oriented X 4 at the time of discharge. RR even and unlabored. No acute distress noted. Pt verbalized understanding of discharge instructions as discussed. Pt in wheelchair to lobby at time of discharge.

## 2023-09-18 ENCOUNTER — Other Ambulatory Visit: Payer: Self-pay

## 2023-09-18 ENCOUNTER — Emergency Department (HOSPITAL_BASED_OUTPATIENT_CLINIC_OR_DEPARTMENT_OTHER)

## 2023-09-18 ENCOUNTER — Encounter (HOSPITAL_BASED_OUTPATIENT_CLINIC_OR_DEPARTMENT_OTHER): Payer: Self-pay

## 2023-09-18 ENCOUNTER — Emergency Department (HOSPITAL_BASED_OUTPATIENT_CLINIC_OR_DEPARTMENT_OTHER)
Admission: EM | Admit: 2023-09-18 | Discharge: 2023-09-18 | Disposition: A | Discharge status: Home / Self Care | Attending: Emergency Medicine | Admitting: Emergency Medicine

## 2023-09-18 DIAGNOSIS — R112 Nausea with vomiting, unspecified: Secondary | ICD-10-CM | POA: Diagnosis not present

## 2023-09-18 DIAGNOSIS — R109 Unspecified abdominal pain: Secondary | ICD-10-CM | POA: Diagnosis present

## 2023-09-18 DIAGNOSIS — E039 Hypothyroidism, unspecified: Secondary | ICD-10-CM | POA: Insufficient documentation

## 2023-09-18 DIAGNOSIS — R1084 Generalized abdominal pain: Secondary | ICD-10-CM | POA: Diagnosis not present

## 2023-09-18 DIAGNOSIS — R933 Abnormal findings on diagnostic imaging of other parts of digestive tract: Secondary | ICD-10-CM | POA: Insufficient documentation

## 2023-09-18 LAB — COMPREHENSIVE METABOLIC PANEL WITH GFR
ALT: 15 U/L (ref 0–44)
AST: 20 U/L (ref 15–41)
Albumin: 4.1 g/dL (ref 3.5–5.0)
Alkaline Phosphatase: 71 U/L (ref 38–126)
Anion gap: 13 (ref 5–15)
BUN: 12 mg/dL (ref 6–20)
CO2: 23 mmol/L (ref 22–32)
Calcium: 9.3 mg/dL (ref 8.9–10.3)
Chloride: 103 mmol/L (ref 98–111)
Creatinine, Ser: 0.93 mg/dL (ref 0.44–1.00)
GFR, Estimated: >60 mL/min (ref >60)
Glucose, Bld: 84 mg/dL (ref 70–99)
Potassium: 3.9 mmol/L (ref 3.5–5.1)
Sodium: 139 mmol/L (ref 135–145)
Total Bilirubin: 0.7 mg/dL (ref 0.0–1.2)
Total Protein: 6.4 g/dL — ABNORMAL LOW (ref 6.5–8.1)

## 2023-09-18 LAB — URINALYSIS, ROUTINE W REFLEX MICROSCOPIC
Bacteria, UA: NONE SEEN
Bilirubin Urine: NEGATIVE
Glucose, UA: NEGATIVE mg/dL
Nitrite: NEGATIVE
Protein, ur: 30 mg/dL — AB
Specific Gravity, Urine: 1.029 (ref 1.005–1.030)
WBC, UA: >50 WBC/hpf (ref 0–5)
pH: 7 (ref 5.0–8.0)

## 2023-09-18 LAB — CBC
HCT: 45.1 % (ref 36.0–46.0)
Hemoglobin: 15.2 g/dL — ABNORMAL HIGH (ref 12.0–15.0)
MCH: 32.5 pg (ref 26.0–34.0)
MCHC: 33.7 g/dL (ref 30.0–36.0)
MCV: 96.4 fL (ref 80.0–100.0)
Platelets: 139 10*3/uL — ABNORMAL LOW (ref 150–400)
RBC: 4.68 MIL/uL (ref 3.87–5.11)
RDW: 13 % (ref 11.5–15.5)
WBC: 5.6 10*3/uL (ref 4.0–10.5)
nRBC: 0 % (ref 0.0–0.2)

## 2023-09-18 LAB — PROTIME-INR
INR: 0.9 (ref 0.8–1.2)
Prothrombin Time: 12.6 s (ref 11.4–15.2)

## 2023-09-18 LAB — LACTIC ACID, PLASMA: Lactic Acid, Venous: 1.6 mmol/L (ref 0.5–1.9)

## 2023-09-18 LAB — RESP PANEL BY RT-PCR (RSV, FLU A&B, COVID)  RVPGX2
Influenza A by PCR: NEGATIVE
Influenza B by PCR: NEGATIVE
Resp Syncytial Virus by PCR: NEGATIVE
SARS Coronavirus 2 by RT PCR: NEGATIVE

## 2023-09-18 LAB — LIPASE, BLOOD: Lipase: 32 U/L (ref 11–51)

## 2023-09-18 LAB — AMMONIA: Ammonia: 31 umol/L (ref 9–35)

## 2023-09-18 LAB — PREGNANCY, URINE: Preg Test, Ur: NEGATIVE

## 2023-09-18 MED ORDER — ONDANSETRON HCL 4 MG/2ML IJ SOLN
4.0000 mg | Freq: Once | INTRAMUSCULAR | Status: AC
  Administered 2023-09-18: 4 mg via INTRAVENOUS
  Filled 2023-09-18: qty 2

## 2023-09-18 MED ORDER — SODIUM CHLORIDE 0.9 % IV BOLUS
1000.0000 mL | Freq: Once | INTRAVENOUS | Status: AC
  Administered 2023-09-18: 1000 mL via INTRAVENOUS

## 2023-09-18 MED ORDER — MORPHINE SULFATE (PF) 4 MG/ML IV SOLN
4.0000 mg | Freq: Once | INTRAVENOUS | Status: AC
  Administered 2023-09-18: 4 mg via INTRAVENOUS
  Filled 2023-09-18: qty 1

## 2023-09-18 MED ORDER — IOHEXOL 300 MG/ML  SOLN
100.0000 mL | Freq: Once | INTRAMUSCULAR | Status: AC | PRN
  Administered 2023-09-18: 100 mL via INTRAVENOUS

## 2023-09-18 NOTE — Discharge Instructions (Addendum)
 Your history, exam, and workup today led us  to do labs and imaging to rule out acute obstruction or some other surgical problem tonight.  The CT scan did not show obstruction but did show an abnormality with thickened tissue at the rectosigmoid junction in your colon that could be concerning for mass.  Given the nausea and vomiting and pain you has been dealing with, this may have contributed to some of the symptoms.  Radiology recommended colonoscopy and we recommend having you call your [facility] gastroenterology team tomorrow to discuss close follow-up.  Please rest and stay hydrated and if any symptoms change or worsen acutely, return to the nearest emergency department.

## 2023-09-18 NOTE — ED Notes (Signed)
Pt aware of the need for a urine... Unable to currently collect the sample.Marland KitchenMarland Kitchen

## 2023-09-18 NOTE — ED Notes (Signed)
 Sent gray and lavender on ice

## 2023-09-18 NOTE — ED Provider Notes (Signed)
 [place] [facility]ERGENCY DEPARTMENT AT [facility] Provider Note   CSN: [id number] Arrival date & time: 09/18/23  1714     History  Chief Complaint  Patient presents with   Abdominal Pain    Patricia Lutz is a 38 y.o. female.  The history is provided by the patient, medical records and a significant other. No language interpreter was used.  Abdominal Pain Pain location:  Generalized Pain quality: aching, bloating and cramping   Pain radiates to:  Does not radiate Pain severity:  Severe Onset quality:  Gradual Duration:  1 week Timing:  Constant Progression:  Waxing and waning Chronicity:  New (chonric pain but this feels worse) Context: previous surgery   Relieved by:  Nothing Worsened by:  Palpation Ineffective treatments:  None tried Associated symptoms: chills, fatigue, nausea and vomiting   Associated symptoms: no chest pain, no constipation, no cough, no diarrhea, no dysuria (urine looks different) and no shortness of breath   Risk factors: multiple surgeries        Home Medications Prior to Admission medications   Medication Sig Start Date End Date Taking? Authorizing Provider  ALPRAZOLAM  PO Take 0.5 mg by mouth 3 (three) times daily as needed for anxiety (1-2 at 7pm). 12/14/20   [provider]  buprenorphine  (SUBUTEX ) 8 MG SUBL SL tablet Place 16 mg under the tongue See admin instructions. 2 tabs every morning, 1 tablet every evening, 2 tablets at night 03/27/23   [provider]  Calcium Citrate-Vitamin D3 315-6.25 MG-MCG TABS Take 2 tablets by mouth 2 (two) times daily at 10 AM and 5 PM. 09/28/19   [provider]  cephALEXin  (KEFLEX ) 500 MG capsule Take 1 capsule (500 mg total) by mouth 3 (three) times daily. Patient not taking: Reported on 06/27/2023 05/17/23   Dalene Duck, MD  Continuous Blood Gluc Sensor (DEXCOM G7 SENSOR) MISC USE 1 EACH EVERY 10 DAYS    [provider]  CRANBERRY PO Take 325 mg by mouth every  morning.    [provider]  cycloSPORINE  modified (NEORAL ) 25 MG capsule Take 50 mg by mouth in the morning and at bedtime. 12/16/20   [provider]  famotidine  (PEPCID ) 40 MG tablet Take 40 mg by mouth at bedtime.    [provider]  ferrous sulfate 325 (65 FE) MG tablet Take 325 mg by mouth daily. 06/23/23   [provider]  furosemide  (LASIX ) 20 MG tablet Take 40 mg by mouth daily.    [provider]  hydrOXYzine (ATARAX) 25 MG tablet Take 25 mg by mouth daily. 06/24/23   [provider]  lansoprazole (PREVACID) 15 MG capsule Take 30 mg by mouth 2 (two) times daily before a meal.    [provider]  levothyroxine  (SYNTHROID ) 137 MCG tablet Take 137 mcg by mouth daily before breakfast.    [provider]  lidocaine  (LIDODERM ) 5 % Place 1 patch onto the skin daily. Remove & Discard patch within 12 hours or as directed by MD 04/04/23   Barrett, Jamie N, PA-C  magnesium  oxide (MAG-OX) 400 MG tablet Take 2 tablets by mouth 2 (two) times daily. 11/01/20   [provider]  mupirocin ointment (BACTROBAN) 2 % Apply 1 Application topically daily. 08/01/22   [provider]  NUCYNTA 50 MG tablet Take 50 mg by mouth every 8 (eight) hours as needed. 06/21/23   [provider]  ondansetron  (ZOFRAN -ODT) 4 MG disintegrating tablet Take 1 tablet (4 mg total) by  mouth every 8 (eight) hours as needed. 03/11/23   Bartow Butter, PA  predniSONE  (DELTASONE ) 1 MG tablet Take by mouth. *4 tablets every morning and 3 tablets daily at 3 pm* 06/17/23   [provider]  sertraline (ZOLOFT) 100 MG tablet Take 100 mg by mouth daily. 06/05/23   [provider]  VEMLIDY  25 MG TABS Take 1 tablet by mouth daily. 11/30/20   [provider]      Allergies    Ibuprofen    Review of Systems   Review of Systems  Constitutional:  Positive for chills and fatigue.  HENT:  Negative for congestion.   Respiratory:   Negative for cough, chest tightness, shortness of breath and wheezing.   Cardiovascular:  Negative for chest pain, palpitations and leg swelling.  Gastrointestinal:  Positive for abdominal pain, nausea and vomiting. Negative for constipation and diarrhea.  Genitourinary:  Negative for dysuria (urine looks different), flank pain and frequency.  Musculoskeletal:  Negative for back pain and neck pain.  Skin:  Negative for wound.  Neurological:  Negative for light-headedness and headaches.  Psychiatric/Behavioral:  Negative for agitation.   All other systems reviewed and are negative.   Physical Exam Updated Vital Signs BP 114/77 (BP Location: Left Arm)   Pulse 83   Temp 98.2 F (36.8 C) (Oral)   Ht 5\' 2"  (1.575 m)   Wt 81.6 kg   SpO2 99%   BMI 32.92 kg/m  Physical Exam Vitals and nursing note reviewed.  Constitutional:      General: She is not in acute distress.    Appearance: She is well-developed.  HENT:     Head: Normocephalic and atraumatic.  Eyes:     General: No scleral icterus.    Extraocular Movements: Extraocular movements intact.     Conjunctiva/sclera: Conjunctivae normal.  Cardiovascular:     Rate and Rhythm: Normal rate and regular rhythm.     Heart sounds: Normal heart sounds. No murmur heard. Pulmonary:     Effort: Pulmonary effort is normal. No respiratory distress.     Breath sounds: Normal breath sounds. No wheezing, rhonchi or rales.  Chest:     Chest wall: No tenderness.  Abdominal:     General: Abdomen is flat. Bowel sounds are normal. There is no distension.     Palpations: Abdomen is soft.     Tenderness: There is abdominal tenderness. There is no right CVA tenderness, left CVA tenderness, guarding or rebound.  Musculoskeletal:        General: No swelling.     Cervical back: Neck supple.  Skin:    General: Skin is warm and dry.     Capillary Refill: Capillary refill takes less than 2 seconds.     Coloration: Skin is not pale.     Findings: No  rash.  Neurological:     Mental Status: She is alert.  Psychiatric:        Mood and Affect: Mood normal.     ED Results / Procedures / Treatments   Labs (all labs ordered are listed, but only abnormal results are displayed) Labs Reviewed  COMPREHENSIVE METABOLIC PANEL WITH GFR - Abnormal; Notable for the following components:      Result Value   Total Protein 6.4 (*)    All other components within normal limits  CBC - Abnormal; Notable for the following components:   Hemoglobin 15.2 (*)    Platelets 139 (*)    All other components within normal limits  URINALYSIS, ROUTINE W REFLEX MICROSCOPIC - Abnormal; Notable for the following components:   APPearance HAZY (*)    Hgb urine dipstick TRACE (*)    Ketones, ur TRACE (*)    Protein, ur 30 (*)    Leukocytes,Ua LARGE (*)    All other components within normal limits  RESP PANEL BY RT-PCR (RSV, FLU A&B, COVID)  RVPGX2  URINE CULTURE  LIPASE, BLOOD  PREGNANCY, URINE  LACTIC ACID, PLASMA  PROTIME-INR  AMMONIA    EKG None  Radiology CT ABDOMEN PELVIS W CONTRAST Result Date: 09/18/2023 CLINICAL DATA:  Acute nonlocalized abdominal pain. Nausea, vomiting, and abdominal distention. History of liver transplant. EXAM: CT ABDOMEN AND PELVIS WITH CONTRAST TECHNIQUE: Multidetector CT imaging of the abdomen and pelvis was performed using the standard protocol following bolus administration of intravenous contrast. RADIATION DOSE REDUCTION: This exam was performed according to the departmental dose-optimization program which includes automated exposure control, adjustment of the mA and/or kV according to patient size and/or use of iterative reconstruction technique. CONTRAST:  OMNIPAQUE  IOHEXOL  300 MG/ML  SOLN COMPARISON:  06/27/2023 FINDINGS: Lower chest: Mild dependent atelectasis in the lung bases. Hepatobiliary: Surgical absence of the gallbladder. Mild bile duct dilatation is unchanged since previous study, likely physiologic post  cholecystectomy. No stones identified. Pancreas: Unremarkable. No pancreatic ductal dilatation or surrounding inflammatory changes. Spleen: Spleen is enlarged.  No focal lesions. Adrenals/Urinary Tract: Adrenal glands are unremarkable. Kidneys are normal, without renal calculi, focal lesion, or hydronephrosis. Bladder is decompressed with a suprapubic catheter. Stomach/Bowel: Stomach, small bowel, and colon are not abnormally distended. There is a focal area of narrowing and wall thickening in the rectosigmoid junction. This could represent muscular hypertrophy but a colonic mass lesion could also have this appearance. Consider colonoscopy for further evaluation. No proximal obstruction. Otherwise, no wall thickening or inflammatory changes. Stool throughout the colon. Appendix is not identified. Vascular/Lymphatic: Normal caliber abdominal aorta. No aneurysm or dissection. Upper abdominal, paraesophageal, and retroperitoneal varices. Reproductive: Uterus and ovaries are not enlarged. An intrauterine device is present. Other: No free air or free fluid in the abdomen. Abdominal wall musculature appears intact. Musculoskeletal: Slight anterior subluxation of L4 on L5 is likely degenerative. No vertebral compression deformities. IMPRESSION: 1. Mild bile duct dilatation is likely normal for postoperative physiology. No stones identified. 2. Spleen is enlarged. 3. Paraesophageal and abdominal varices. 4. Nonspecific focal wall thickening at the rectosigmoid junction possibly representing a focal: Mass. Consider colonoscopy for further evaluation. 5. Suprapubic catheter in the bladder.  Intrauterine device. Electronically Signed   By: Boyce Byes M.D.   On: 09/18/2023 19:57    Procedures Procedures    Medications Ordered in ED Medications  sodium chloride  0.9 % bolus 1,000 mL (0 mLs Intravenous Stopped 09/18/23 2025)  morphine  (PF) 4 MG/ML injection 4 mg (4 mg Intravenous Given 09/18/23 1818)  ondansetron   (ZOFRAN ) injection 4 mg (4 mg Intravenous Given 09/18/23 1817)  iohexol  (OMNIPAQUE ) 300 MG/ML solution 100 mL (100 mLs Intravenous Contrast Given 09/18/23 1936)  morphine  (PF) 4 MG/ML injection 4 mg (4 mg Intravenous Given 09/18/23 2048)    ED Course/ Medical Decision Making/ A&P                                 Medical Decision Making Amount and/or Complexity of Data Reviewed Labs: ordered. Radiology: ordered.  Risk Prescription drug management.    Patricia Lutz is a 38 y.o. female with a  past medical history significant for previous liver transplant, renal insufficiency, chronic indwelling suprapubic catheter, GERD, diabetes, Hashimoto's disease, anxiety, depression, stomach ulcers, hypothyroidism, and chronic abdominal pain who presents with worsening abdominal pain, nausea, vomiting.  According to patient, her last several days she has had worsened abdominal pain across her upper and mid abdomen.  She reports it feels different than the pain she is having more chronically.  She reports that they are trying to make medication changes for her and switch her to fentanyl  patches that does not seem to be helping.  She reports they are less of late she had nausea and vomiting vomiting up food that looks undigested.  She reports no constipation or diarrhea but does say her urine has looked and smelled different than normal.  She reports no chest pain shortness breath or cough but has been around some URI symptoms in the family.  She does have some chills but no documented fevers.  She reports no trauma.  She reports the pain is extremely severe up to 9 out of 10 in severity and feels that she is bloated and distended at times.  She does not have a history of bowel obstruction.  She denies other complaints on arrival, primarily the pain and vomiting.  On exam, lungs clear.  Chest nontender.  No murmur.  Abdomen is diffusely tender and she has multiple surgical scars in her abdomen.  Bowel sounds were  appreciated on my exam and back was nontender.  No flank tenderness or CVA tenderness.  Patient does have dry mucous membranes.  No tenderness or swelling in the extremities at this time.  Given the patient's report that her pain feels different, she is having vomiting, abdomen feels distended at times, and she has had previous surgeries, and I do feel and get repeat imaging to rule out obstruction or ileus.  Will get screening labs, urinalysis given the urinary changes, and give her some pain and nausea medicine and fluids.  She reports that morphine  is worked well as has IV Zofran .  Anticipate reassessment after workup to determine disposition.  8:43 PM Workup continues to return.  Patient is pneumonia lactic acid were normal.  She is not pregnant.  Viral testing for COVID/flu/RSV negative.  Lipase not elevated.  Urinalysis showed some ketones but otherwise no nitrites or bacteria, doubt infection.  Possibly some dehydration.  CMP reassuring and CBC did not show leukocytosis.  CT scan returned without evidence of obstruction or ileus but did show a thickened area of tissue in the rectosigmoid junction that could be a mass.  I told the patient of this finding and that she likely has a colonoscopy.  Patient reports that she saw GI a month ago with [facility] that did an endoscopy and will call them in the morning.  She is starting feel better after the medicine.  We did not feel there was an acute surgical problem tonight and given her reassuring labs and improvement in symptoms and vital signs, we feel she is safe for discharge home.  Patient will call her GI team tomorrow to discuss further steps in evaluation of this possible rectosigmoid mass that may be contributing some of her bowel problems and abdominal pains.  She agreed with plan of care and had other questions or concerns.  Patient will be discharged after another dose of pain medicine.  Otherwise she is already on strong oxycodone  so would not give  her further pain medicine as an outpatient tonight.  Final Clinical Impression(s) / ED Diagnoses Final diagnoses:  Abnormal CT scan, sigmoid colon  Generalized abdominal pain  Nausea and vomiting, unspecified vomiting type    Rx / DC Orders ED Discharge Orders     None      Clinical Impression: 1. Abnormal CT scan, sigmoid colon   2. Generalized abdominal pain   3. Nausea and vomiting, unspecified vomiting type     Disposition: Discharge  Condition: Good  I have discussed the results, Dx and Tx plan with the pt(& family if present). He/she/they expressed understanding and agree(s) with the plan. Discharge instructions discussed at great length. Strict return precautions discussed and pt &/or family have verbalized understanding of the instructions. No further questions at time of discharge.    New Prescriptions   No medications on file    Follow Up: your [facility] gastroenterology team     Stamey, Lowell Rude, FNP [address] [place] [address] [phone]        Margarit Minshall, Marine Sia, MD 09/18/23 2110

## 2023-09-18 NOTE — ED Triage Notes (Signed)
 Pt via pov from home with abdominal pain x 5 days. She states this is "on top of my normal pain". She has upper abdominal chronic pain since having a liver transplant (2021); was recently changed from buprinorphine to fentanyl , and this is not working for her. Pt has suprapubic catheter since the surgery, as her bladder no longer works. Pt alert & oriented, nad noted.

## 2023-09-21 LAB — URINE CULTURE: Culture: 70000 — AB

## 2023-09-22 ENCOUNTER — Telehealth (HOSPITAL_BASED_OUTPATIENT_CLINIC_OR_DEPARTMENT_OTHER): Payer: Self-pay | Admitting: *Deleted

## 2023-09-22 NOTE — Telephone Encounter (Signed)
 Post ED Visit - Positive Culture Follow-up  Culture report reviewed by antimicrobial stewardship pharmacist: [facility] []  Court Distance, Pharm.D. []  Skeet Duke, [address].D., BCPS AQ-ID []  Leslee Rase, Pharm.D., BCPS []  Garland Junk, Pharm.D., BCPS []  Josephine, [address].D., BCPS, AAHIVP []  Alcide Aly, Pharm.D., BCPS, AAHIVP []  Jerri Morale, PharmD, BCPS []  Graham Laws, PharmD, BCPS []  Cleda Curly, PharmD, BCPS []  Tamar Fairly, PharmD []  Ballard Levels, PharmD, BCPS [x]  Mamie Searles, PharmD  [facility] []  Arlyne Bering, PharmD []  Sherryle Don, PharmD []  Van Gelinas, PharmD []  Delila Felty, Rph []  Luna [place]) Cleora Daft, PharmD []  Augustina Block, PharmD []  Arie Kurtz, PharmD []  Sharlyn Deaner, PharmD []  Agnes Hose, PharmD []  Kendall Pauls, PharmD []  Gladstone Lamer, PharmD []  Armanda Bern, PharmD []  Tera Fellows, PharmD   Positive urine culture  No treatment needed per Judie Noun, PA-C  Georgine Kitchens 09/22/2023, 10:51 AM

## 2023-10-03 ENCOUNTER — Inpatient Hospital Stay (HOSPITAL_BASED_OUTPATIENT_CLINIC_OR_DEPARTMENT_OTHER)
Admission: EM | Admit: 2023-10-03 | Discharge: 2023-10-08 | DRG: 092 | Disposition: A | Discharge status: Home / Self Care | Attending: Internal Medicine | Admitting: Internal Medicine

## 2023-10-03 ENCOUNTER — Encounter (HOSPITAL_BASED_OUTPATIENT_CLINIC_OR_DEPARTMENT_OTHER): Payer: Self-pay | Admitting: Emergency Medicine

## 2023-10-03 ENCOUNTER — Emergency Department (HOSPITAL_BASED_OUTPATIENT_CLINIC_OR_DEPARTMENT_OTHER)

## 2023-10-03 ENCOUNTER — Other Ambulatory Visit: Payer: Self-pay

## 2023-10-03 DIAGNOSIS — E119 Type 2 diabetes mellitus without complications: Secondary | ICD-10-CM | POA: Diagnosis present

## 2023-10-03 DIAGNOSIS — K7460 Unspecified cirrhosis of liver: Secondary | ICD-10-CM | POA: Diagnosis present

## 2023-10-03 DIAGNOSIS — I959 Hypotension, unspecified: Secondary | ICD-10-CM | POA: Diagnosis present

## 2023-10-03 DIAGNOSIS — K766 Portal hypertension: Secondary | ICD-10-CM | POA: Diagnosis present

## 2023-10-03 DIAGNOSIS — Z944 Liver transplant status: Principal | ICD-10-CM

## 2023-10-03 DIAGNOSIS — F411 Generalized anxiety disorder: Secondary | ICD-10-CM | POA: Diagnosis not present

## 2023-10-03 DIAGNOSIS — K5903 Drug induced constipation: Secondary | ICD-10-CM | POA: Diagnosis not present

## 2023-10-03 DIAGNOSIS — R600 Localized edema: Secondary | ICD-10-CM

## 2023-10-03 DIAGNOSIS — F32A Depression, unspecified: Secondary | ICD-10-CM | POA: Diagnosis present

## 2023-10-03 DIAGNOSIS — E039 Hypothyroidism, unspecified: Secondary | ICD-10-CM | POA: Diagnosis present

## 2023-10-03 DIAGNOSIS — R109 Unspecified abdominal pain: Secondary | ICD-10-CM | POA: Diagnosis not present

## 2023-10-03 DIAGNOSIS — G8929 Other chronic pain: Secondary | ICD-10-CM | POA: Diagnosis present

## 2023-10-03 DIAGNOSIS — D72829 Elevated white blood cell count, unspecified: Secondary | ICD-10-CM | POA: Diagnosis present

## 2023-10-03 DIAGNOSIS — Z978 Presence of other specified devices: Secondary | ICD-10-CM

## 2023-10-03 DIAGNOSIS — T380X5A Adverse effect of glucocorticoids and synthetic analogues, initial encounter: Secondary | ICD-10-CM | POA: Diagnosis present

## 2023-10-03 DIAGNOSIS — Z886 Allergy status to analgesic agent status: Secondary | ICD-10-CM

## 2023-10-03 DIAGNOSIS — E271 Primary adrenocortical insufficiency: Secondary | ICD-10-CM | POA: Diagnosis present

## 2023-10-03 DIAGNOSIS — G929 Unspecified toxic encephalopathy: Principal | ICD-10-CM | POA: Diagnosis present

## 2023-10-03 DIAGNOSIS — Z8711 Personal history of peptic ulcer disease: Secondary | ICD-10-CM

## 2023-10-03 DIAGNOSIS — K219 Gastro-esophageal reflux disease without esophagitis: Secondary | ICD-10-CM | POA: Diagnosis present

## 2023-10-03 DIAGNOSIS — Z79890 Hormone replacement therapy (postmenopausal): Secondary | ICD-10-CM

## 2023-10-03 DIAGNOSIS — Z833 Family history of diabetes mellitus: Secondary | ICD-10-CM

## 2023-10-03 DIAGNOSIS — Z79899 Other long term (current) drug therapy: Secondary | ICD-10-CM

## 2023-10-03 DIAGNOSIS — R4182 Altered mental status, unspecified: Secondary | ICD-10-CM | POA: Diagnosis present

## 2023-10-03 LAB — URINALYSIS, ROUTINE W REFLEX MICROSCOPIC
Bilirubin Urine: NEGATIVE
Glucose, UA: NEGATIVE mg/dL
Ketones, ur: NEGATIVE mg/dL
Leukocytes,Ua: NEGATIVE
Nitrite: NEGATIVE
Protein, ur: NEGATIVE mg/dL
Specific Gravity, Urine: <1.005 — ABNORMAL LOW (ref 1.005–1.030)
pH: 7.5 (ref 5.0–8.0)

## 2023-10-03 LAB — CBC
HCT: 43.2 % (ref 36.0–46.0)
Hemoglobin: 14.8 g/dL (ref 12.0–15.0)
MCH: 33 pg (ref 26.0–34.0)
MCHC: 34.3 g/dL (ref 30.0–36.0)
MCV: 96.4 fL (ref 80.0–100.0)
Platelets: 143 10*3/uL — ABNORMAL LOW (ref 150–400)
RBC: 4.48 MIL/uL (ref 3.87–5.11)
RDW: 13.4 % (ref 11.5–15.5)
WBC: 8 10*3/uL (ref 4.0–10.5)
nRBC: 0 % (ref 0.0–0.2)

## 2023-10-03 LAB — COMPREHENSIVE METABOLIC PANEL WITH GFR
ALT: 16 U/L (ref 0–44)
AST: 27 U/L (ref 15–41)
Albumin: 4.3 g/dL (ref 3.5–5.0)
Alkaline Phosphatase: 78 U/L (ref 38–126)
Anion gap: 12 (ref 5–15)
BUN: 13 mg/dL (ref 6–20)
CO2: 25 mmol/L (ref 22–32)
Calcium: 9.6 mg/dL (ref 8.9–10.3)
Chloride: 104 mmol/L (ref 98–111)
Creatinine, Ser: 0.95 mg/dL (ref 0.44–1.00)
GFR, Estimated: >60 mL/min (ref >60)
Glucose, Bld: 79 mg/dL (ref 70–99)
Potassium: 3.9 mmol/L (ref 3.5–5.1)
Sodium: 141 mmol/L (ref 135–145)
Total Bilirubin: 1.3 mg/dL — ABNORMAL HIGH (ref 0.0–1.2)
Total Protein: 6.8 g/dL (ref 6.5–8.1)

## 2023-10-03 LAB — LIPASE, BLOOD: Lipase: 19 U/L (ref 11–51)

## 2023-10-03 LAB — AMMONIA: Ammonia: 54 umol/L — ABNORMAL HIGH (ref 9–35)

## 2023-10-03 LAB — PREGNANCY, URINE: Preg Test, Ur: NEGATIVE

## 2023-10-03 MED ORDER — ONDANSETRON HCL 4 MG/2ML IJ SOLN
4.0000 mg | Freq: Once | INTRAMUSCULAR | Status: AC
  Administered 2023-10-03: 4 mg via INTRAVENOUS
  Filled 2023-10-03: qty 2

## 2023-10-03 MED ORDER — HYDROMORPHONE HCL 1 MG/ML IJ SOLN
1.0000 mg | Freq: Once | INTRAMUSCULAR | Status: AC
  Administered 2023-10-03: 1 mg via INTRAVENOUS
  Filled 2023-10-03: qty 1

## 2023-10-03 MED ORDER — HYDROMORPHONE HCL 1 MG/ML IJ SOLN
2.0000 mg | Freq: Once | INTRAMUSCULAR | Status: AC
  Administered 2023-10-03: 2 mg via INTRAVENOUS
  Filled 2023-10-03: qty 2

## 2023-10-03 MED ORDER — FUROSEMIDE 10 MG/ML IJ SOLN
40.0000 mg | Freq: Once | INTRAMUSCULAR | Status: AC
  Administered 2023-10-03: 40 mg via INTRAVENOUS
  Filled 2023-10-03: qty 4

## 2023-10-03 MED ORDER — IOHEXOL 300 MG/ML  SOLN
100.0000 mL | Freq: Once | INTRAMUSCULAR | Status: AC | PRN
  Administered 2023-10-03: 100 mL via INTRAVENOUS

## 2023-10-03 NOTE — ED Provider Notes (Signed)
 [place] EMERGENCY DEPARTMENT AT [facility] Provider Note   CSN: [id number] Arrival date & time: 10/03/23  1517     History  Chief Complaint  Patient presents with   Abdominal Pain    Talma Lively is a 38 y.o. female.  With a past history of status post liver transplant, type 2 diabetes and indwelling suprapubic catheter who presents to the ED for abdominal pain and edema.  Patient has experienced diffuse lower extremity, abdominal and facial edema over the last 1 to 2 weeks.  She was previously on Lasix  but was tapered off of this medication recently.  During the last week she has experienced ongoing abdominal pain nausea vomiting refractory to Zofran  at home.  She did have a dose of Lasix  yesterday but none today.  Seen here recently on April 30 where CT abdomen pelvis showed nonspecific focal wall thickening concerning for focal mass at the rectosigmoid junction.  This was discussed with her transplant team at [facility] and she is scheduled for colonoscopy later this month   Abdominal Pain      Home Medications Prior to Admission medications   Medication Sig Start Date End Date Taking? Authorizing Provider  ALPRAZOLAM  PO Take 0.5 mg by mouth 3 (three) times daily as needed for anxiety (1-2 at 7pm). 12/14/20   [provider]  buprenorphine  (SUBUTEX ) 8 MG SUBL SL tablet Place 16 mg under the tongue See admin instructions. 2 tabs every morning, 1 tablet every evening, 2 tablets at night 03/27/23   [provider]  Calcium Citrate-Vitamin D3 315-6.25 MG-MCG TABS Take 2 tablets by mouth 2 (two) times daily at 10 AM and 5 PM. 09/28/19   [provider]  cephALEXin  (KEFLEX ) 500 MG capsule Take 1 capsule (500 mg total) by mouth 3 (three) times daily. Patient not taking: Reported on 06/27/2023 05/17/23   Dalene Duck, MD  Continuous Blood Gluc Sensor (DEXCOM G7 SENSOR) MISC USE 1 EACH EVERY 10 DAYS    [provider]  CRANBERRY PO Take 325 mg by  mouth every morning.    [provider]  cycloSPORINE  modified (NEORAL ) 25 MG capsule Take 50 mg by mouth in the morning and at bedtime. 12/16/20   [provider]  famotidine  (PEPCID ) 40 MG tablet Take 40 mg by mouth at bedtime.    [provider]  ferrous sulfate 325 (65 FE) MG tablet Take 325 mg by mouth daily. 06/23/23   [provider]  furosemide  (LASIX ) 20 MG tablet Take 40 mg by mouth daily.    [provider]  hydrOXYzine (ATARAX) 25 MG tablet Take 25 mg by mouth daily. 06/24/23   [provider]  lansoprazole (PREVACID) 15 MG capsule Take 30 mg by mouth 2 (two) times daily before a meal.    [provider]  levothyroxine  (SYNTHROID ) 137 MCG tablet Take 137 mcg by mouth daily before breakfast.    [provider]  lidocaine  (LIDODERM ) 5 % Place 1 patch onto the skin daily. Remove & Discard patch within 12 hours or as directed by MD 04/04/23   Barrett, Jamie N, PA-C  magnesium  oxide (MAG-OX) 400 MG tablet Take 2 tablets by mouth 2 (two) times daily. 11/01/20   [provider]  mupirocin ointment (BACTROBAN) 2 % Apply 1 Application topically daily. 08/01/22   [provider]  NUCYNTA 50 MG tablet Take 50 mg by mouth every 8 (eight) hours as needed. 06/21/23   [provider]  ondansetron  (ZOFRAN -ODT) 4  MG disintegrating tablet Take 1 tablet (4 mg total) by mouth every 8 (eight) hours as needed. 03/11/23   Neil Balls A, PA  predniSONE  (DELTASONE ) 1 MG tablet Take by mouth. *4 tablets every morning and 3 tablets daily at 3 pm* 06/17/23   [provider]  sertraline (ZOLOFT) 100 MG tablet Take 100 mg by mouth daily. 06/05/23   [provider]  VEMLIDY  25 MG TABS Take 1 tablet by mouth daily. 11/30/20   [provider]      Allergies    Ibuprofen    Review of Systems   Review of Systems  Gastrointestinal:  Positive for abdominal pain.    Physical Exam Updated Vital  Signs BP 127/87   Pulse (!) 52   Temp 98 F (36.7 C)   Resp 18   SpO2 96%  Physical Exam Vitals and nursing note reviewed.  HENT:     Head: Normocephalic and atraumatic.  Eyes:     Pupils: Pupils are equal, round, and reactive to light.  Cardiovascular:     Rate and Rhythm: Normal rate and regular rhythm.  Pulmonary:     Effort: Pulmonary effort is normal.     Breath sounds: Normal breath sounds.  Abdominal:     Palpations: Abdomen is soft.     Tenderness: There is generalized abdominal tenderness. There is no guarding or rebound.     Comments: Diffuse erythematous skin markings, chronic Several small open areas with serosanguineous drainage over the anterior abdomen Indwelling suprapubic catheter without evidence of erythema induration or drainage at insertion site collection bag draining clear yellow urine  Skin:    General: Skin is warm and dry.     Comments: 1+ bilateral lower extremity edema Multiple small superficial open wounds over face abdomen lower extremities  Neurological:     Mental Status: She is alert.  Psychiatric:        Mood and Affect: Mood normal.     ED Results / Procedures / Treatments   Labs (all labs ordered are listed, but only abnormal results are displayed) Labs Reviewed  CBC - Abnormal; Notable for the following components:      Result Value   Platelets 143 (*)    All other components within normal limits  URINALYSIS, ROUTINE W REFLEX MICROSCOPIC - Abnormal; Notable for the following components:   Color, Urine COLORLESS (*)    Specific Gravity, Urine <1.005 (*)    Hgb urine dipstick SMALL (*)    Bacteria, UA RARE (*)    All other components within normal limits  COMPREHENSIVE METABOLIC PANEL WITH GFR - Abnormal; Notable for the following components:   Total Bilirubin 1.3 (*)    All other components within normal limits  AMMONIA - Abnormal; Notable for the following components:   Ammonia 54 (*)    All other components within normal  limits  PREGNANCY, URINE  LIPASE, BLOOD    EKG None  Radiology CT ABDOMEN PELVIS W CONTRAST Result Date: 10/03/2023 CLINICAL DATA:  Abdominal pain, nausea and vomiting for 1 week, history of liver transplant EXAM: CT ABDOMEN AND PELVIS WITH CONTRAST TECHNIQUE: Multidetector CT imaging of the abdomen and pelvis was performed using the standard protocol following bolus administration of intravenous contrast. RADIATION DOSE REDUCTION: This exam was performed according to the departmental dose-optimization program which includes automated exposure control, adjustment of the mA and/or kV according to patient size and/or use of iterative reconstruction technique. CONTRAST:  OMNIPAQUE  IOHEXOL  300 MG/ML  SOLN COMPARISON:  09/18/2023 FINDINGS: Lower chest: No acute pleural or parenchymal lung disease. Hepatobiliary: Gallbladder is surgically absent. Stable hepatic cysts. Mild biliary duct dilation unchanged, likely physiologic after cholecystectomy and liver transplant. Pancreas: Unremarkable. No pancreatic ductal dilatation or surrounding inflammatory changes. Spleen: Stable splenomegaly.  No focal abnormalities. Adrenals/Urinary Tract: Kidneys enhance normally and symmetrically. No urinary tract calculi or obstructive uropathy. Partial duplication of the left ureter. The adrenals are unremarkable. Bladder is decompressed with a suprapubic catheter, with nonspecific bladder wall thickening likely due to decompressed state. Stomach/Bowel: No bowel obstruction or ileus. The appendix, if still present, is not well visualized. No bowel wall thickening or inflammatory change. Vascular/Lymphatic: Portal vein is patent. Esophageal, gastric, splenic, and mesenteric varices are noted. No pathologic adenopathy. Reproductive: IUD again noted within the endometrial cavity. Normal bilateral ovarian follicles. Other: No free fluid or free intraperitoneal gas. No abdominal wall hernia. Musculoskeletal: No acute or  destructive bony abnormalities. Reconstructed images demonstrate no additional findings. IMPRESSION: 1. Stable postsurgical changes of liver transplant. 2. Evidence of portal venous hypertension manifested by abdominal varices and splenomegaly. No change since prior study. 3. No acute intra-abdominal or intrapelvic process. Electronically Signed   By: Bobbye Burrow M.D.   On: 10/03/2023 20:43    Procedures Procedures    Medications Ordered in ED Medications  HYDROmorphone  (DILAUDID ) injection 1 mg (1 mg Intravenous Given 10/03/23 1737)  furosemide  (LASIX ) injection 40 mg (40 mg Intravenous Given 10/03/23 1737)  ondansetron  (ZOFRAN ) injection 4 mg (4 mg Intravenous Given 10/03/23 1815)  HYDROmorphone  (DILAUDID ) injection 1 mg (1 mg Intravenous Given 10/03/23 1911)  iohexol  (OMNIPAQUE ) 300 MG/ML solution 100 mL (100 mLs Intravenous Contrast Given 10/03/23 1932)  HYDROmorphone  (DILAUDID ) injection 2 mg (2 mg Intravenous Given 10/03/23 2053)    ED Course/ Medical Decision Making/ A&P Clinical Course as of 10/03/23 2304  Thu Oct 03, 2023  2100 Laboratory workup shows slightly elevated ammonia close to her baseline.  No mental status changes.  No leukocytosis or significant electrolyte abnormalities.  No UTI.  CT abdomen pelvis shows chronic findings consistent with portal hypertension but no acute findings in abdomen pelvis.  No mention of previously seen focal wall thickening at the rectosigmoid junction.  Given the lack of acute findings and her labs and imaging no need to transfer to [facility] at this time but she does require admission for continued diuresis and pain management.  Discussed admitting hospitalist accepts patient for admission [MP]    Clinical Course User Index [MP] Sallyanne Creamer, DO                                 Medical Decision Making 38 year old female with history as above including liver transplant presenting for abdominal pain nausea vomiting and diffuse edema throughout  lower extremities and abdomen.  Recently discontinued off of Lasix .  Was seen here last month where CT abdomen pelvis was concerning for focal mass at the rectosigmoid junction.  She is scheduled for a colonoscopy at [facility] later this month.  Afebrile normotensive here but uncomfortable appearing due to diffuse edema.  Given level of medical complexity and status post liver transplant will obtain laboratory workup, CT abdomen pelvis, give IV Lasix , Dilaudid  for pain control Zofran  for nausea vomiting.  Need to evaluate for intra-abdominal infection, obstructing mass or other acute intra-abdominal pathology.  Once her ED workup is completed we will reach out to her transplant team at [facility] to ascertain need  for admission here versus transfer to [facility]  Amount and/or Complexity of Data Reviewed Labs: ordered. Radiology: ordered.  Risk Prescription drug management. Decision regarding hospitalization.           Final Clinical Impression(s) / ED Diagnoses Final diagnoses:  Liver transplant recipient [facility])  Portal hypertension (HCC)  Peripheral edema    Rx / DC Orders ED Discharge Orders     None         Sallyanne Creamer, DO 10/03/23 2304

## 2023-10-03 NOTE — ED Notes (Signed)
 Called [facility] to transport patient to [facility] 5E rm# [id number]

## 2023-10-03 NOTE — ED Notes (Signed)
 Lab needs LT green redraw

## 2023-10-03 NOTE — Plan of Care (Signed)
 Plan of Care Note for accepted transfer   Patient name: Patricia Lutz JXB:[id number] DOB: October 24, 1985  Facility requesting transfer: [facility] ED Requesting Provider: Dr. Ranelle Buys Facility course: 38 year old female with history of alcohol/substance abuse, cirrhosis status post liver transplant, Addison's disease, PUD, CKD stage IIIa, diabetes, hypothyroidism, chronic indwelling Foley catheter presented to the ED with complaints of abdominal pain/swelling, bilateral lower extremity edema, nausea, and vomiting.  Home Lasix  was discontinued recently. No fever, tachycardia, or hypotension.  Labs showing no leukocytosis, no significant elevation of LFTs, lipase normal, urine pregnancy test negative, UA not suggestive of infection, ammonia level 54.  No confusion or altered mental status.  CT abdomen pelvis with contrast showing: "IMPRESSION: 1. Stable postsurgical changes of liver transplant. 2. Evidence of portal venous hypertension manifested by abdominal varices and splenomegaly. No change since prior study. 3. No acute intra-abdominal or intrapelvic process."  Patient was given IV Lasix  40 mg, Dilaudid , and Zofran .  Plan of care: The patient is accepted for admission to Telemetry unit at [facility].  [facility] will assume care on arrival to accepting facility. Until arrival, care as per EDP. However, [facility] available 24/7 for questions and assistance.  Check [email or web address] for on-call coverage.  Nursing staff, please call [facility] Admits & Consults System-Wide number under Amion on patient's arrival so appropriate admitting provider can evaluate the pt.

## 2023-10-03 NOTE — ED Triage Notes (Signed)
 Abd pain, n/v x 1 week Stopped furosemide  last month, not swelling with leaking on abdomen  Liver transplant  hx

## 2023-10-04 ENCOUNTER — Encounter (HOSPITAL_COMMUNITY): Payer: Self-pay | Admitting: Family Medicine

## 2023-10-04 DIAGNOSIS — R1084 Generalized abdominal pain: Secondary | ICD-10-CM | POA: Diagnosis not present

## 2023-10-04 DIAGNOSIS — E271 Primary adrenocortical insufficiency: Secondary | ICD-10-CM | POA: Diagnosis present

## 2023-10-04 DIAGNOSIS — G8929 Other chronic pain: Secondary | ICD-10-CM | POA: Diagnosis present

## 2023-10-04 DIAGNOSIS — Z886 Allergy status to analgesic agent status: Secondary | ICD-10-CM | POA: Diagnosis not present

## 2023-10-04 DIAGNOSIS — F32A Depression, unspecified: Secondary | ICD-10-CM | POA: Diagnosis present

## 2023-10-04 DIAGNOSIS — Z8711 Personal history of peptic ulcer disease: Secondary | ICD-10-CM | POA: Diagnosis not present

## 2023-10-04 DIAGNOSIS — Z833 Family history of diabetes mellitus: Secondary | ICD-10-CM | POA: Diagnosis not present

## 2023-10-04 DIAGNOSIS — K7460 Unspecified cirrhosis of liver: Secondary | ICD-10-CM | POA: Diagnosis present

## 2023-10-04 DIAGNOSIS — E119 Type 2 diabetes mellitus without complications: Secondary | ICD-10-CM | POA: Diagnosis present

## 2023-10-04 DIAGNOSIS — D72829 Elevated white blood cell count, unspecified: Secondary | ICD-10-CM | POA: Diagnosis present

## 2023-10-04 DIAGNOSIS — F411 Generalized anxiety disorder: Secondary | ICD-10-CM | POA: Diagnosis present

## 2023-10-04 DIAGNOSIS — E039 Hypothyroidism, unspecified: Secondary | ICD-10-CM | POA: Diagnosis present

## 2023-10-04 DIAGNOSIS — R17 Unspecified jaundice: Secondary | ICD-10-CM | POA: Diagnosis not present

## 2023-10-04 DIAGNOSIS — K766 Portal hypertension: Secondary | ICD-10-CM | POA: Diagnosis present

## 2023-10-04 DIAGNOSIS — R109 Unspecified abdominal pain: Secondary | ICD-10-CM | POA: Diagnosis present

## 2023-10-04 DIAGNOSIS — R4182 Altered mental status, unspecified: Secondary | ICD-10-CM

## 2023-10-04 DIAGNOSIS — Z944 Liver transplant status: Secondary | ICD-10-CM | POA: Diagnosis not present

## 2023-10-04 DIAGNOSIS — I959 Hypotension, unspecified: Secondary | ICD-10-CM | POA: Diagnosis present

## 2023-10-04 DIAGNOSIS — K5903 Drug induced constipation: Secondary | ICD-10-CM | POA: Diagnosis not present

## 2023-10-04 DIAGNOSIS — Z79899 Other long term (current) drug therapy: Secondary | ICD-10-CM | POA: Diagnosis not present

## 2023-10-04 DIAGNOSIS — T380X5A Adverse effect of glucocorticoids and synthetic analogues, initial encounter: Secondary | ICD-10-CM | POA: Diagnosis present

## 2023-10-04 DIAGNOSIS — K219 Gastro-esophageal reflux disease without esophagitis: Secondary | ICD-10-CM | POA: Diagnosis present

## 2023-10-04 DIAGNOSIS — Z79890 Hormone replacement therapy (postmenopausal): Secondary | ICD-10-CM | POA: Diagnosis not present

## 2023-10-04 DIAGNOSIS — G929 Unspecified toxic encephalopathy: Secondary | ICD-10-CM | POA: Diagnosis present

## 2023-10-04 LAB — COMPREHENSIVE METABOLIC PANEL WITH GFR
ALT: 16 U/L (ref 0–44)
AST: 20 U/L (ref 15–41)
Albumin: 3.9 g/dL (ref 3.5–5.0)
Alkaline Phosphatase: 64 U/L (ref 38–126)
Anion gap: 11 (ref 5–15)
BUN: 15 mg/dL (ref 6–20)
CO2: 22 mmol/L (ref 22–32)
Calcium: 9 mg/dL (ref 8.9–10.3)
Chloride: 102 mmol/L (ref 98–111)
Creatinine, Ser: 0.93 mg/dL (ref 0.44–1.00)
GFR, Estimated: >60 mL/min (ref >60)
Glucose, Bld: 53 mg/dL — ABNORMAL LOW (ref 70–99)
Potassium: 3.6 mmol/L (ref 3.5–5.1)
Sodium: 135 mmol/L (ref 135–145)
Total Bilirubin: 1.8 mg/dL — ABNORMAL HIGH (ref 0.0–1.2)
Total Protein: 6.8 g/dL (ref 6.5–8.1)

## 2023-10-04 LAB — CBC
HCT: 44.7 % (ref 36.0–46.0)
Hemoglobin: 14.8 g/dL (ref 12.0–15.0)
MCH: 33.6 pg (ref 26.0–34.0)
MCHC: 33.1 g/dL (ref 30.0–36.0)
MCV: 101.4 fL — ABNORMAL HIGH (ref 80.0–100.0)
Platelets: 148 10*3/uL — ABNORMAL LOW (ref 150–400)
RBC: 4.41 MIL/uL (ref 3.87–5.11)
RDW: 13.7 % (ref 11.5–15.5)
WBC: 7.7 10*3/uL (ref 4.0–10.5)
nRBC: 0 % (ref 0.0–0.2)

## 2023-10-04 LAB — GLUCOSE, CAPILLARY
Glucose-Capillary: 66 mg/dL — ABNORMAL LOW (ref 70–99)
Glucose-Capillary: 73 mg/dL (ref 70–99)
Glucose-Capillary: 63 mg/dL — ABNORMAL LOW (ref 70–99)
Glucose-Capillary: 229 mg/dL — ABNORMAL HIGH (ref 70–99)
Glucose-Capillary: 247 mg/dL — ABNORMAL HIGH (ref 70–99)

## 2023-10-04 LAB — HIV ANTIBODY (ROUTINE TESTING W REFLEX): HIV Screen 4th Generation wRfx: NONREACTIVE

## 2023-10-04 MED ORDER — PREDNISONE 1 MG PO TABS
3.0000 mg | ORAL_TABLET | ORAL | Status: DC
  Filled 2023-10-04: qty 3

## 2023-10-04 MED ORDER — OXYCODONE HCL 5 MG PO TABS: 5.0000 mg | ORAL_TABLET | ORAL | Status: DC | PRN

## 2023-10-04 MED ORDER — LEVOTHYROXINE SODIUM 100 MCG PO TABS
100.0000 ug | ORAL_TABLET | Freq: Every day | ORAL | Status: DC
  Administered 2023-10-05 – 2023-10-08 (×4): 100 ug via ORAL
  Filled 2023-10-04 (×4): qty 1

## 2023-10-04 MED ORDER — TENOFOVIR ALAFENAMIDE FUMARATE 25 MG PO TABS
25.0000 mg | ORAL_TABLET | Freq: Every day | ORAL | Status: DC
  Administered 2023-10-05 – 2023-10-07 (×3): 25 mg via ORAL
  Filled 2023-10-04 (×4): qty 1

## 2023-10-04 MED ORDER — ENOXAPARIN SODIUM 40 MG/0.4ML IJ SOSY
40.0000 mg | PREFILLED_SYRINGE | INTRAMUSCULAR | Status: DC
  Administered 2023-10-04 – 2023-10-07 (×4): 40 mg via SUBCUTANEOUS
  Filled 2023-10-04 (×4): qty 0.4

## 2023-10-04 MED ORDER — FUROSEMIDE 40 MG PO TABS
40.0000 mg | ORAL_TABLET | Freq: Every day | ORAL | Status: DC
  Administered 2023-10-04 – 2023-10-07 (×4): 40 mg via ORAL
  Filled 2023-10-04 (×4): qty 1

## 2023-10-04 MED ORDER — CYCLOSPORINE MODIFIED (NEORAL) 25 MG PO CAPS
50.0000 mg | ORAL_CAPSULE | Freq: Two times a day (BID) | ORAL | Status: DC
  Administered 2023-10-04 – 2023-10-07 (×8): 50 mg via ORAL
  Filled 2023-10-04 (×10): qty 2

## 2023-10-04 MED ORDER — HYDROCORTISONE SOD SUC (PF) 100 MG IJ SOLR
50.0000 mg | Freq: Four times a day (QID) | INTRAMUSCULAR | Status: DC
  Administered 2023-10-04 – 2023-10-05 (×3): 50 mg via INTRAVENOUS
  Filled 2023-10-04 (×3): qty 2

## 2023-10-04 MED ORDER — SODIUM CHLORIDE 0.9% FLUSH
3.0000 mL | Freq: Two times a day (BID) | INTRAVENOUS | Status: DC
  Administered 2023-10-04 – 2023-10-07 (×7): 3 mL via INTRAVENOUS

## 2023-10-04 MED ORDER — PREDNISONE 1 MG PO TABS
4.0000 mg | ORAL_TABLET | Freq: Every day | ORAL | Status: DC
  Administered 2023-10-04: 4 mg via ORAL
  Filled 2023-10-04: qty 4

## 2023-10-04 MED ORDER — ACETAMINOPHEN 325 MG PO TABS
650.0000 mg | ORAL_TABLET | Freq: Four times a day (QID) | ORAL | Status: DC | PRN
  Administered 2023-10-07: 650 mg via ORAL
  Filled 2023-10-04: qty 2

## 2023-10-04 MED ORDER — POLYETHYLENE GLYCOL 3350 17 G PO PACK: 17.0000 g | PACK | Freq: Every day | ORAL | Status: DC | PRN

## 2023-10-04 MED ORDER — HYDROCORTISONE SOD SUC (PF) 100 MG IJ SOLR
20.0000 mg | Freq: Once | INTRAMUSCULAR | Status: AC
  Administered 2023-10-04: 20 mg via INTRAVENOUS
  Filled 2023-10-04: qty 2

## 2023-10-04 MED ORDER — DEXTROSE 50 % IV SOLN
25.0000 g | INTRAVENOUS | Status: AC
  Administered 2023-10-04: 25 g via INTRAVENOUS
  Filled 2023-10-04: qty 50

## 2023-10-04 MED ORDER — HYDROMORPHONE HCL 1 MG/ML IJ SOLN
1.0000 mg | Freq: Once | INTRAMUSCULAR | Status: AC
  Administered 2023-10-04: 1 mg via INTRAVENOUS
  Filled 2023-10-04: qty 1

## 2023-10-04 MED ORDER — ALPRAZOLAM 0.5 MG PO TABS
0.5000 mg | ORAL_TABLET | Freq: Three times a day (TID) | ORAL | Status: DC | PRN
  Administered 2023-10-04 – 2023-10-05 (×2): 0.5 mg via ORAL
  Filled 2023-10-04 (×2): qty 1

## 2023-10-04 MED ORDER — LACTULOSE 10 GM/15ML PO SOLN
20.0000 g | Freq: Two times a day (BID) | ORAL | Status: DC
  Administered 2023-10-04 – 2023-10-05 (×3): 20 g via ORAL
  Filled 2023-10-04 (×3): qty 30

## 2023-10-04 MED ORDER — ONDANSETRON HCL 4 MG/2ML IJ SOLN
4.0000 mg | Freq: Four times a day (QID) | INTRAMUSCULAR | Status: DC | PRN
  Administered 2023-10-04 – 2023-10-06 (×3): 4 mg via INTRAVENOUS
  Filled 2023-10-04 (×4): qty 2

## 2023-10-04 MED ORDER — SODIUM CHLORIDE 0.9 % IV SOLN: 12.5000 mg | Freq: Four times a day (QID) | INTRAVENOUS | Status: DC | PRN

## 2023-10-04 MED ORDER — LEVOTHYROXINE SODIUM 125 MCG PO TABS
125.0000 ug | ORAL_TABLET | Freq: Every day | ORAL | Status: DC
  Administered 2023-10-04: 125 ug via ORAL
  Filled 2023-10-04: qty 1

## 2023-10-04 MED ORDER — LEVOTHYROXINE SODIUM 25 MCG PO TABS: 137.0000 ug | ORAL_TABLET | Freq: Every day | ORAL | Status: DC

## 2023-10-04 MED ORDER — HYDROCORTISONE SOD SUC (PF) 100 MG IJ SOLR
100.0000 mg | Freq: Once | INTRAMUSCULAR | Status: AC
  Administered 2023-10-04: 100 mg via INTRAVENOUS
  Filled 2023-10-04: qty 2

## 2023-10-04 MED ORDER — SERTRALINE HCL 100 MG PO TABS
100.0000 mg | ORAL_TABLET | Freq: Every day | ORAL | Status: DC
  Administered 2023-10-04 – 2023-10-07 (×4): 100 mg via ORAL
  Filled 2023-10-04 (×4): qty 1

## 2023-10-04 MED ORDER — HYDROMORPHONE HCL 1 MG/ML IJ SOLN
0.5000 mg | INTRAMUSCULAR | Status: DC | PRN
  Administered 2023-10-04: 1 mg via INTRAVENOUS
  Filled 2023-10-04: qty 1

## 2023-10-04 MED ORDER — ONDANSETRON HCL 4 MG PO TABS
4.0000 mg | ORAL_TABLET | Freq: Four times a day (QID) | ORAL | Status: DC | PRN
  Administered 2023-10-05 – 2023-10-07 (×2): 4 mg via ORAL
  Filled 2023-10-04 (×2): qty 1

## 2023-10-04 MED ORDER — ACETAMINOPHEN 650 MG RE SUPP: 650.0000 mg | Freq: Four times a day (QID) | RECTAL | Status: DC | PRN

## 2023-10-04 NOTE — Progress Notes (Signed)
 PROGRESS NOTE  Patricia Lutz  NWG:[id number] DOB: 06-30-1985 DOA: 10/03/2023 PCP: Lorenza Romans, FNP  Consultants  Brief Narrative: 38 y.o. 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 Patient has a long history of abdominal pain for which she has undergone workup including multiple EGDs and MRIs.  Pain improved temporarily after exploratory laparotomy with lysis of adhesions and 2022.  Also with some confusion.  Came to [facility] ED and had normal/negative CT of abdomen pelvis.  Admitted to hospitalist for intractable pain.   Assessment & Plan: Altered mental status: - Metabolic encephalopathy secondary to low blood pressure and hyperammonemia  - now main concern.  Patient confused and very groggy on my exam this morning. - Blood pressure in the 80s with a MAP of 63. - Ammonia of 53. 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  Reports being in a "dreamlike state" where she does not know what is real from what is not. - She is on prednisone  3 mg chronically.  She does have hydrocortisone  at home for stress dose treatment but did not take this. - Starting hydrocortisone  100 mg x 1 now with 50 mg every 6 for stress dose steroids. -Also starting lactulose  to treat elevated ammonia  Intractable pain: - Scratch that abdominal pain: - Improved since admission.  Still painful. - Continue current pain control, antiemetics.  Currently on a clear liquid diet and will continue this  Status post liver transplant: - Continue cyclosporine ,Vemlidy  - Transplant at [facility]. - Low threshold to transfer if any acute worsening. - Will follow LFTs/bilirubin - On Lasix .  Hyperbilirubinemia: - Bilirubin 1.8.  Will fractionate and trend  Anxiety: - Continue Zoloft and as  needed Xanax   Hypothyroidism: - Check TSH.  Patient was written for 125 mcg but she believes that she takes 100 mcg at home.  I made the switch    DVT prophylaxis:  enoxaparin  (LOVENOX ) injection 40 mg Start: 10/04/23 1000  Code Status:   Code Status: Full Code Level of care: Telemetry Status is: Inpatient   Subjective: Patient feels confused.  She is oriented to herself, year[facility].  States this is similar to prior hepatic encephalopathy episodes.  Did not have any lactulose  at home  Not hungry.  Abdominal pain is still present but better than on admission  Objective: Vitals:   10/04/23 0208 10/04/23 0529 10/04/23 0851 10/04/23 1333  BP: 103/80 105/70 (!) 84/53 (!) 92/57  Pulse: 63 62 60 61  Resp: 18 15 17 18   Temp: 97.6 F (36.4 C) (!) 97.5 F (36.4 C) 98.2 F (36.8 C) 97.6 F (36.4 C)  TempSrc:   Oral Oral  SpO2: 96% 97% 95% 97%    Intake/Output Summary (Last 24 hours) at 10/04/2023 1617 Last data filed at 10/04/2023 0600 Gross per 24 hour  Intake --  Output 1800 ml  Net -1800 ml   There were no vitals filed for this visit. There is no height or weight on file to calculate BMI.  Gen: 38 y.o. female in no apparent distress.  Nontoxic Pulm: Non-labored breathing.  Clear to auscultation bilaterally.  CV: Regular rate and rhythm. No murmur, rub, or gallop. No JVD GI: Abdomen soft, nondistended.  Tender to palpation bilateral lower quadrants.  No ascites noted Ext: Warm, no deformities,  Skin: No rashes, lesions Neuro:  Alert and oriented. No focal neurological deficits. Psych: Calm  Judgement and insight appear normal. Mood & affect appropriate.     I have personally reviewed the following labs and images: CBC: Recent Labs  Lab 10/03/23 1612 10/04/23 0508  WBC 8.0 7.7  HGB 14.8 14.8  HCT 43.2 44.7  MCV 96.4 101.4*  PLT 143* 148*   BMP &GFR Recent Labs  Lab 10/03/23 1715 10/04/23 0508  NA 141 135  K 3.9 3.6  CL 104 102  CO2 25 22   GLUCOSE 79 53*  BUN 13 15  CREATININE 0.95 0.93  CALCIUM 9.6 9.0   CrCl cannot be calculated (Unknown ideal weight.). Liver & Pancreas: Recent Labs  Lab 10/03/23 1715 10/04/23 0508  AST 27 20  ALT 16 16  ALKPHOS 78 64  BILITOT 1.3* 1.8*  PROT 6.8 6.8  ALBUMIN  4.3 3.9   Recent Labs  Lab 10/03/23 1715  LIPASE 19   Recent Labs  Lab 10/03/23 1803  AMMONIA 54*   Diabetic: No results for input(s): "HGBA1C" in the last 72 hours. Recent Labs  Lab 10/04/23 0218 10/04/23 0300 10/04/23 0510 10/04/23 0530 10/04/23 0533  GLUCAP 66* 73 63* 247* 229*   Cardiac Enzymes: No results for input(s): "CKTOTAL", "CKMB", "CKMBINDEX", "TROPONINI" in the last 168 hours. No results for input(s): "PROBNP" in the last 8760 hours. Coagulation Profile: No results for input(s): "INR", "PROTIME" in the last 168 hours. Thyroid Function Tests: No results for input(s): "TSH", "T4TOTAL", "FREET4", "T3FREE", "THYROIDAB" in the last 72 hours. Lipid Profile: No results for input(s): "CHOL", "HDL", "LDLCALC", "TRIG", "CHOLHDL", "LDLDIRECT" in the last 72 hours. Anemia Panel: No results for input(s): "VITAMINB12", "FOLATE", "FERRITIN", "TIBC", "IRON", "RETICCTPCT" in the last 72 hours. Urine analysis:    Component Value Date/Time   COLORURINE COLORLESS (A) 10/03/2023 1527   APPEARANCEUR CLEAR 10/03/2023 1527   LABSPEC <1.005 (L) 10/03/2023 1527   PHURINE 7.5 10/03/2023 1527   GLUCOSEU NEGATIVE 10/03/2023 1527   HGBUR SMALL (A) 10/03/2023 1527   BILIRUBINUR NEGATIVE 10/03/2023 1527   KETONESUR NEGATIVE 10/03/2023 1527   PROTEINUR NEGATIVE 10/03/2023 1527   NITRITE NEGATIVE 10/03/2023 1527   LEUKOCYTESUR NEGATIVE 10/03/2023 1527   Sepsis Labs: Invalid input(s): "PROCALCITONIN", "LACTICIDVEN"  Microbiology: No results found for this or any previous visit (from the past 240 hours).  Radiology Studies: CT ABDOMEN PELVIS W CONTRAST Result Date: 10/03/2023 CLINICAL DATA:  Abdominal pain,  nausea and vomiting for 1 week, history of liver transplant EXAM: CT ABDOMEN AND PELVIS WITH CONTRAST TECHNIQUE: Multidetector CT imaging of the abdomen and pelvis was performed using the standard protocol following bolus administration of intravenous contrast. RADIATION DOSE REDUCTION: This exam was performed according to the departmental dose-optimization program which includes automated exposure control, adjustment of the mA and/or kV according to patient size and/or use of iterative reconstruction technique. CONTRAST:  OMNIPAQUE  IOHEXOL  300 MG/ML  SOLN COMPARISON:  09/18/2023 FINDINGS: Lower chest: No acute pleural or parenchymal lung disease. Hepatobiliary: Gallbladder is surgically absent. Stable hepatic cysts. Mild biliary duct dilation unchanged, likely physiologic after cholecystectomy and liver transplant. Pancreas: Unremarkable. No pancreatic ductal dilatation or surrounding inflammatory changes. Spleen: Stable splenomegaly.  No focal abnormalities. Adrenals/Urinary Tract: Kidneys enhance normally and symmetrically. No urinary tract calculi or obstructive uropathy. Partial duplication of the left ureter. The adrenals are unremarkable. Bladder is decompressed with a suprapubic catheter, with nonspecific bladder wall thickening likely due to decompressed state. Stomach/Bowel: No bowel obstruction or ileus. The appendix, if still present, is not  well visualized. No bowel wall thickening or inflammatory change. Vascular/Lymphatic: Portal vein is patent. Esophageal, gastric, splenic, and mesenteric varices are noted. No pathologic adenopathy. Reproductive: IUD again noted within the endometrial cavity. Normal bilateral ovarian follicles. Other: No free fluid or free intraperitoneal gas. No abdominal wall hernia. Musculoskeletal: No acute or destructive bony abnormalities. Reconstructed images demonstrate no additional findings. IMPRESSION: 1. Stable postsurgical changes of liver transplant. 2. Evidence  of portal venous hypertension manifested by abdominal varices and splenomegaly. No change since prior study. 3. No acute intra-abdominal or intrapelvic process. Electronically Signed   By: Bobbye Burrow M.D.   On: 10/03/2023 20:43    Scheduled Meds:  cycloSPORINE  modified  50 mg Oral BID   enoxaparin  (LOVENOX ) injection  40 mg Subcutaneous Q24H   furosemide   40 mg Oral Daily   hydrocortisone  sod succinate (SOLU-CORTEF ) inj  50 mg Intravenous Q6H   lactulose   20 g Oral BID   [START ON 10/05/2023] levothyroxine   100 mcg Oral QAC breakfast   sertraline  100 mg Oral Daily   sodium chloride  flush  3 mL Intravenous Q12H   tenofovir  alafenamide  25 mg Oral Daily   Continuous Infusions:  promethazine  (PHENERGAN ) injection (IM or IVPB)       LOS: 0 days   35 minutes with more than 50% spent in reviewing records, counseling patient/family and coordinating care.  Trenton Frock, MD [facility] [email or web address] 10/04/2023, 4:17 PM

## 2023-10-04 NOTE — ED Notes (Signed)
[facility] at bedside to transport patient. 

## 2023-10-04 NOTE — Plan of Care (Signed)

## 2023-10-04 NOTE — Progress Notes (Signed)
 Notified MD [phone] pt is difficult to keep aroused to take her meds.  Bed alarm on to ensure pt safety.

## 2023-10-04 NOTE — Progress Notes (Addendum)
 Pt admitted to unit from [facility] via [facility]. POC BG upon arrival was 66, pt awake alert at baseline. Hypoglycemic protocol initiated with plans to give IV dextrose  since pt reports inability to keep anything down by mouth.  Pt became pretty drowsy during admission process, having difficulty staying awake to answer questions. VS remain stable and BG recheck was 73. IV dextrose  held at this time. Encouraged PO intake. Pt remains drowsy but easily awakens to voice. Admitting MD present at bedside.

## 2023-10-04 NOTE — H&P (Signed)
 History and Physical    Patricia Lutz ZOX:[id number] DOB: 1985/07/18 DOA: 10/03/2023  PCP: Lorenza Romans, FNP   Patient coming from: Home   Chief Complaint: Abdominal pain, N/V   HPI: Patricia Lutz is a 38 y.o. 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   Patient has a long history of abdominal pain for which she has undergone workup including multiple EGDs and MRIs.  Pain improved temporarily after exploratory laparotomy with lysis of adhesions and 2022.  Her current pain is similar to her prior episodes and has been associated with nausea and nonbloody vomiting.  Patient also reports that she woke with significant abdominal distention yesterday, noted that she had gained 20 pounds overnight, and took a dose of Lasix . The distension has since resolved.   [facility] ED Course: Upon arrival to the ED, patient is found to be afebrile and saturating well on room air with stable BP.  Labs are most notable for normal lipase, normal alkaline phosphatase and transaminases, total bilirubin 1.3, normal renal function, and normal WBC.  CT of the abdomen and pelvis is negative for acute findings.  Patient was treated with Zofran , IV Lasix , and 4 doses of IV Dilaudid  in the ED.  She was transferred to [facility] for admission.  Review of Systems:  All other systems reviewed and apart from HPI, are negative.  Past Medical History:  Diagnosis Date   Anxiety    Cirrhosis (HCC)    Depression    Diabetes mellitus without complication (HCC)    GERD (gastroesophageal reflux disease)    Hashimoto's disease    History of stomach ulcers    Hypothyroidism    Substance abuse (HCC)    alcohol    Past Surgical History:  Procedure Laterality Date   CESAREAN SECTION     IR CATHETER TUBE CHANGE  10/29/2022    IR CATHETER TUBE CHANGE  11/28/2022   LIVER TRANSPLANT      Social History:   reports that she has never smoked. She has never used smokeless tobacco. She reports that she does not currently use alcohol. She reports that she does not use drugs.  Allergies  Allergen Reactions   Ibuprofen Nausea And Vomiting and Other (See Comments)    Not able to take due to liver    Family History  Problem Relation Age of Onset   Crohn's disease Mother    Crohn's disease Maternal Grandmother    Diabetes Brother      Prior to Admission medications   Medication Sig Start Date End Date Taking? Authorizing Provider  ALPRAZOLAM  PO Take 0.5 mg by mouth 3 (three) times daily as needed for anxiety (1-2 at 7pm). 12/14/20   [provider]  buprenorphine  (SUBUTEX ) 8 MG SUBL SL tablet Place 16 mg under the tongue See admin instructions. 2 tabs every morning, 1 tablet every evening, 2 tablets at night 03/27/23   [provider]  Calcium Citrate-Vitamin D3 315-6.25 MG-MCG TABS Take 2 tablets by mouth 2 (two) times daily at 10 AM and 5 PM. 09/28/19   [provider]  cephALEXin  (KEFLEX ) 500 MG capsule Take 1 capsule (500 mg total) by mouth 3 (three) times daily. Patient not taking: Reported on 06/27/2023 05/17/23   Dalene Duck, MD  Continuous Blood Gluc Sensor (DEXCOM G7 SENSOR) MISC USE 1 EACH EVERY 10 DAYS  [provider]  CRANBERRY PO Take 325 mg by mouth every morning.    [provider]  cycloSPORINE  modified (NEORAL ) 25 MG capsule Take 50 mg by mouth in the morning and at bedtime. 12/16/20   [provider]  famotidine  (PEPCID ) 40 MG tablet Take 40 mg by mouth at bedtime.    [provider]  ferrous sulfate 325 (65 FE) MG tablet Take 325 mg by mouth daily. 06/23/23   [provider]  furosemide  (LASIX ) 20 MG tablet Take 40 mg by mouth daily.    [provider]  hydrOXYzine (ATARAX) 25 MG tablet Take 25 mg by mouth daily.  06/24/23   [provider]  lansoprazole (PREVACID) 15 MG capsule Take 30 mg by mouth 2 (two) times daily before a meal.    [provider]  levothyroxine  (SYNTHROID ) 137 MCG tablet Take 137 mcg by mouth daily before breakfast.    [provider]  lidocaine  (LIDODERM ) 5 % Place 1 patch onto the skin daily. Remove & Discard patch within 12 hours or as directed by MD 04/04/23   Barrett, Jamie N, PA-C  magnesium  oxide (MAG-OX) 400 MG tablet Take 2 tablets by mouth 2 (two) times daily. 11/01/20   [provider]  mupirocin ointment (BACTROBAN) 2 % Apply 1 Application topically daily. 08/01/22   [provider]  NUCYNTA 50 MG tablet Take 50 mg by mouth every 8 (eight) hours as needed. 06/21/23   [provider]  ondansetron  (ZOFRAN -ODT) 4 MG disintegrating tablet Take 1 tablet (4 mg total) by mouth every 8 (eight) hours as needed. 03/11/23   Loaza Butter, PA  predniSONE  (DELTASONE ) 1 MG tablet Take by mouth. *4 tablets every morning and 3 tablets daily at 3 pm* 06/17/23   [provider]  sertraline (ZOLOFT) 100 MG tablet Take 100 mg by mouth daily. 06/05/23   [provider]  VEMLIDY  25 MG TABS Take 1 tablet by mouth daily. 11/30/20   [provider]    Physical Exam: Vitals:   10/03/23 2215 10/03/23 2315 10/04/23 0110 10/04/23 0208  BP: 127/87 120/84 104/74 103/80  Pulse: (!) 52 (!) 59 81 63  Resp: 18 12 17 18   Temp:   97.8 F (36.6 C) 97.6 F (36.4 C)  TempSrc:   Oral   SpO2: 96% 96% 96% 96%    Constitutional: NAD, no pallor or diaphoresis  Eyes: PERTLA, lids and conjunctivae normal ENMT: Mucous membranes are moist. Posterior pharynx clear of any exudate or lesions.   Neck: supple, no masses  Respiratory: no wheezing, no crackles. No accessory muscle use.  Cardiovascular: S1 & S2 heard, regular rate and rhythm. Trace lower extremity edema.  Abdomen: Soft, generally tender without guarding. Bowel sounds active.   Musculoskeletal: no clubbing / cyanosis. No joint deformity upper and lower extremities.   Skin: Violaceous striae cover abdomen. Superficial excoriations.  Warm, dry, well-perfused. Neurologic: CN 2-12 grossly intact. Sleeping, wakes to voice and oriented to person, place, and situation.  Psychiatric: Calm. Cooperative.    Labs and Imaging on Admission: I have personally reviewed following labs and imaging studies  CBC: Recent Labs  Lab 10/03/23 1612  WBC 8.0  HGB 14.8  HCT 43.2  MCV 96.4  PLT 143*   Basic Metabolic Panel: Recent Labs  Lab 10/03/23 1715  NA 141  K 3.9  CL 104  CO2 25  GLUCOSE 79  BUN 13  CREATININE 0.95  CALCIUM 9.6   GFR: CrCl cannot  be calculated (Unknown ideal weight.). Liver Function Tests: Recent Labs  Lab 10/03/23 1715  AST 27  ALT 16  ALKPHOS 78  BILITOT 1.3*  PROT 6.8  ALBUMIN  4.3   Recent Labs  Lab 10/03/23 1715  LIPASE 19   Recent Labs  Lab 10/03/23 1803  AMMONIA 54*   Coagulation Profile: No results for input(s): "INR", "PROTIME" in the last 168 hours. Cardiac Enzymes: No results for input(s): "CKTOTAL", "CKMB", "CKMBINDEX", "TROPONINI" in the last 168 hours. BNP (last 3 results) No results for input(s): "PROBNP" in the last 8760 hours. HbA1C: No results for input(s): "HGBA1C" in the last 72 hours. CBG: Recent Labs  Lab 10/04/23 0218 10/04/23 0300  GLUCAP 66* 73   Lipid Profile: No results for input(s): "CHOL", "HDL", "LDLCALC", "TRIG", "CHOLHDL", "LDLDIRECT" in the last 72 hours. Thyroid Function Tests: No results for input(s): "TSH", "T4TOTAL", "FREET4", "T3FREE", "THYROIDAB" in the last 72 hours. Anemia Panel: No results for input(s): "VITAMINB12", "FOLATE", "FERRITIN", "TIBC", "IRON", "RETICCTPCT" in the last 72 hours. Urine analysis:    Component Value Date/Time   COLORURINE COLORLESS (A) 10/03/2023 1527   APPEARANCEUR CLEAR 10/03/2023 1527   LABSPEC <1.005 (L) 10/03/2023 1527   PHURINE 7.5 10/03/2023  1527   GLUCOSEU NEGATIVE 10/03/2023 1527   HGBUR SMALL (A) 10/03/2023 1527   BILIRUBINUR NEGATIVE 10/03/2023 1527   KETONESUR NEGATIVE 10/03/2023 1527   PROTEINUR NEGATIVE 10/03/2023 1527   NITRITE NEGATIVE 10/03/2023 1527   LEUKOCYTESUR NEGATIVE 10/03/2023 1527   Sepsis Labs: @LABRCNTIP (procalcitonin:4,lacticidven:4) )No results found for this or any previous visit (from the past 240 hours).   Radiological Exams on Admission: CT ABDOMEN PELVIS W CONTRAST Result Date: 10/03/2023 CLINICAL DATA:  Abdominal pain, nausea and vomiting for 1 week, history of liver transplant EXAM: CT ABDOMEN AND PELVIS WITH CONTRAST TECHNIQUE: Multidetector CT imaging of the abdomen and pelvis was performed using the standard protocol following bolus administration of intravenous contrast. RADIATION DOSE REDUCTION: This exam was performed according to the departmental dose-optimization program which includes automated exposure control, adjustment of the mA and/or kV according to patient size and/or use of iterative reconstruction technique. CONTRAST:  OMNIPAQUE  IOHEXOL  300 MG/ML  SOLN COMPARISON:  09/18/2023 FINDINGS: Lower chest: No acute pleural or parenchymal lung disease. Hepatobiliary: Gallbladder is surgically absent. Stable hepatic cysts. Mild biliary duct dilation unchanged, likely physiologic after cholecystectomy and liver transplant. Pancreas: Unremarkable. No pancreatic ductal dilatation or surrounding inflammatory changes. Spleen: Stable splenomegaly.  No focal abnormalities. Adrenals/Urinary Tract: Kidneys enhance normally and symmetrically. No urinary tract calculi or obstructive uropathy. Partial duplication of the left ureter. The adrenals are unremarkable. Bladder is decompressed with a suprapubic catheter, with nonspecific bladder wall thickening likely due to decompressed state. Stomach/Bowel: No bowel obstruction or ileus. The appendix, if still present, is not well visualized. No bowel wall  thickening or inflammatory change. Vascular/Lymphatic: Portal vein is patent. Esophageal, gastric, splenic, and mesenteric varices are noted. No pathologic adenopathy. Reproductive: IUD again noted within the endometrial cavity. Normal bilateral ovarian follicles. Other: No free fluid or free intraperitoneal gas. No abdominal wall hernia. Musculoskeletal: No acute or destructive bony abnormalities. Reconstructed images demonstrate no additional findings. IMPRESSION: 1. Stable postsurgical changes of liver transplant. 2. Evidence of portal venous hypertension manifested by abdominal varices and splenomegaly. No change since prior study. 3. No acute intra-abdominal or intrapelvic process. Electronically Signed   By: Bobbye Burrow M.D.   On: 10/03/2023 20:43    Assessment/Plan   1. Intractable abdominal pain; N/V    -  No acute findings on CT, LFTs and lipase normal, exam benign  - Continue pain-control, antiemetics, advance diet as tolerated   2. Liver transplant recipient  - Continue cyclosporin, prednisone , Vemlidy     3. Anxiety  - Zoloft, as-needed Xanax     4. Hypothyroidism  - Synthroid      DVT prophylaxis: Lovenox   Code Status: Full  Level of Care: Level of care: Telemetry Family Communication: None present  Disposition Plan:  Patient is from: home  Anticipated d/c is to: Home  Anticipated d/c date is: 5/16 or 10/05/23  Patient currently: Pending pain-control and tolerance of adequate oral intake  Consults called: None  Admission status: Observation     Walton Guppy, MD [facility]  10/04/2023, 3:16 AM

## 2023-10-04 NOTE — Progress Notes (Signed)
   10/04/23 1340  TOC Brief Assessment  Insurance and Status Reviewed  Patient has primary care physician Yes  Home environment has been reviewed Home w/ spouse  Prior level of function: Independent  Prior/Current Home Services No current home services  Social Drivers of Health Review SDOH reviewed no interventions necessary  Readmission risk has been reviewed Yes  Transition of care needs no transition of care needs at this time

## 2023-10-04 NOTE — Progress Notes (Addendum)
 BG 63, pt remains asymptomatic. IV dextrose  25g given per MAR. BG 15 minute recheck 229. MD notified; no new orders. Per MD, no need to recheck BG again.

## 2023-10-05 DIAGNOSIS — R109 Unspecified abdominal pain: Secondary | ICD-10-CM | POA: Diagnosis not present

## 2023-10-05 LAB — COMPREHENSIVE METABOLIC PANEL WITH GFR
ALT: 20 U/L (ref 0–44)
AST: 22 U/L (ref 15–41)
Albumin: 3.9 g/dL (ref 3.5–5.0)
Alkaline Phosphatase: 71 U/L (ref 38–126)
Anion gap: 12 (ref 5–15)
BUN: 25 mg/dL — ABNORMAL HIGH (ref 6–20)
CO2: 25 mmol/L (ref 22–32)
Calcium: 9.8 mg/dL (ref 8.9–10.3)
Chloride: 100 mmol/L (ref 98–111)
Creatinine, Ser: 0.9 mg/dL (ref 0.44–1.00)
GFR, Estimated: >60 mL/min (ref >60)
Glucose, Bld: 141 mg/dL — ABNORMAL HIGH (ref 70–99)
Potassium: 4.1 mmol/L (ref 3.5–5.1)
Sodium: 137 mmol/L (ref 135–145)
Total Bilirubin: 1.3 mg/dL — ABNORMAL HIGH (ref 0.0–1.2)
Total Protein: 7 g/dL (ref 6.5–8.1)

## 2023-10-05 LAB — CBC
HCT: 44.7 % (ref 36.0–46.0)
Hemoglobin: 14.9 g/dL (ref 12.0–15.0)
MCH: 32.9 pg (ref 26.0–34.0)
MCHC: 33.3 g/dL (ref 30.0–36.0)
MCV: 98.7 fL (ref 80.0–100.0)
Platelets: 167 10*3/uL (ref 150–400)
RBC: 4.53 MIL/uL (ref 3.87–5.11)
RDW: 13.4 % (ref 11.5–15.5)
WBC: 11.9 10*3/uL — ABNORMAL HIGH (ref 4.0–10.5)
nRBC: 0 % (ref 0.0–0.2)

## 2023-10-05 MED ORDER — LACTULOSE 10 GM/15ML PO SOLN: 30.0000 g | Freq: Three times a day (TID) | ORAL | Status: DC

## 2023-10-05 MED ORDER — OXYCODONE HCL 5 MG PO TABS
5.0000 mg | ORAL_TABLET | Freq: Four times a day (QID) | ORAL | Status: DC | PRN
  Administered 2023-10-05 – 2023-10-07 (×4): 5 mg via ORAL
  Filled 2023-10-05 (×4): qty 1

## 2023-10-05 MED ORDER — BOOST / RESOURCE BREEZE PO LIQD CUSTOM
1.0000 | Freq: Three times a day (TID) | ORAL | Status: DC
  Administered 2023-10-05 – 2023-10-07 (×4): 1 via ORAL

## 2023-10-05 MED ORDER — LACTULOSE 10 GM/15ML PO SOLN
30.0000 g | Freq: Three times a day (TID) | ORAL | Status: DC
Start: 2023-10-05 — End: 2023-10-08
  Administered 2023-10-05 – 2023-10-07 (×8): 30 g via ORAL
  Filled 2023-10-05 (×8): qty 45

## 2023-10-05 MED ORDER — HYDROCORTISONE SOD SUC (PF) 100 MG IJ SOLR
50.0000 mg | Freq: Three times a day (TID) | INTRAMUSCULAR | Status: DC
  Administered 2023-10-05 – 2023-10-06 (×4): 50 mg via INTRAVENOUS
  Filled 2023-10-05 (×4): qty 2

## 2023-10-05 NOTE — Progress Notes (Signed)
 With patient's husband's help, and a whole lot of encouragement, pt sat on side of bed and stood 3 times using walker.  Pt struggled and was very unsteady.  Pt was praised for her hard work and is now resting comfortably in bed with family at bedside.

## 2023-10-05 NOTE — Progress Notes (Signed)
 Pt remains sleeping soundly in no distress.

## 2023-10-05 NOTE — Plan of Care (Signed)

## 2023-10-05 NOTE — Progress Notes (Signed)
 PROGRESS NOTE  Patricia Lutz  ZOX:[id number] DOB: September 01, 1985 DOA: 10/03/2023 PCP: Lorenza Romans, FNP  Consultants  Brief Narrative: 38 y.o. 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 Patient has a long history of abdominal pain for which she has undergone workup including multiple EGDs and MRIs.  Pain improved temporarily after exploratory laparotomy with lysis of adhesions and 2022.  Also with some confusion.  Came to [facility] ED and had normal/negative CT of abdomen pelvis.  Admitted to hospitalist for intractable pain.   Assessment & Plan: Altered mental status, Metabolic encephalopathy secondary to low blood pressure and hyperammonemia -and also sedating medications - Still somewhat confused this morning.  Very groggy earlier in the morning after she received her Dilaudid  and Xanax  together.  She came around and woke up as the morning and afternoon progressed. - She still has not had a bowel movement. - Increased her lactulose  from 20 mg p.o. twice daily to 30 mg p.o. 3 times daily.  Goal is 2-3 soft bowel movements a day. - I have switched/stopped a lot of her sedating medications.  She was written for Xanax  3 times a day as needed and I have stopped this completely.  She received once in the morning yesterday once the morning this morning.  She is not taking this 3 times daily every single day and so risk of seizure holding this is low. - I have switched her from hydromorphone  to oxycodone  as needed as I do not have a clear source for her pain and she was so groggy after administration this morning -Await bowel movements.  Adrenal insufficiency: - Blood pressure in the 80s with a MAP of 63 on 5/16.   - She is on prednisone  3 mg chronically.  She does have hydrocortisone  at home for stress dose treatment  but did not take this. - Since starting her on stress dose steroids her blood pressures have remained relatively low but much better for her with pressures in the high 90s to 110s. - Will taper off hydrocortisone  as her pressures remain good  Intractable abdominal pain: - Improved since admission.  Still some pain but no specific area.  Abdominal exam this morning was very reassuring. - See above regarding holding sedating medications. - Will move to full liquid diet.  Status post liver transplant: - Continue cyclosporine ,Vemlidy  - Transplant at [facility]. - Low threshold to transfer if any acute worsening. - Will follow LFTs/bilirubin - On Lasix .  Hyperbilirubinemia: -Downtrending today.  Anxiety: - Continue Zoloft  and holding Xanax  as oversedated this morning after administration  Hypothyroidism: - TSH pending  Leukocytosis: - Secondary to hydrocortisone  use.    DVT prophylaxis:  enoxaparin  (LOVENOX ) injection 40 mg Start: 10/04/23 1000  Code Status:   Code Status: Full Code Level of care: Telemetry Status is: Inpatient   Subjective: Patient groggy this a.m. and still feels a little confused.  She just received dual dose of hydromorphone  and Xanax  together.  Still has not had a bowel movement.  Pain is much better today  Objective: Vitals:   10/04/23 0851 10/04/23 1333 10/04/23 2001 10/05/23 0512  BP: (!) 84/53 (!) 92/57 111/73 99/66  Pulse: 60 61 (!) 54 61  Resp: 17 18 20 18   Temp: 98.2 F (36.8 C) 97.6 F (36.4 C) 98.5 F (36.9 C) 98.3 F (36.8 C)  TempSrc: Oral Oral  Oral  SpO2: 95%  97% 94% 95%    Intake/Output Summary (Last 24 hours) at 10/05/2023 1531 Last data filed at 10/05/2023 0515 Gross per 24 hour  Intake --  Output 900 ml  Net -900 ml   There were no vitals filed for this visit. There is no height or weight on file to calculate BMI.  Gen: 38 y.o. female in no apparent distress.  Nontoxic Pulm: Non-labored breathing.  Clear to auscultation  bilaterally.  CV: Regular rate and rhythm. GI: Abdomen soft, nondistended.  Minimal tenderness bilateral lower quadrants without ascites.  Better today than yesterday. Ext: Warm, no deformities,  Skin: No rashes, lesions  Neuro: Less alert today than yesterday.  Oriented to herself and being in the hospital. Psych: Calm. Mood & affect appropriate.     I have personally reviewed the following labs and images: CBC: Recent Labs  Lab 10/03/23 1612 10/04/23 0508 10/05/23 0646  WBC 8.0 7.7 11.9*  HGB 14.8 14.8 14.9  HCT 43.2 44.7 44.7  MCV 96.4 101.4* 98.7  PLT 143* 148* 167   BMP &GFR Recent Labs  Lab 10/03/23 1715 10/04/23 0508 10/05/23 0646  NA 141 135 137  K 3.9 3.6 4.1  CL 104 102 100  CO2 25 22 25   GLUCOSE 79 53* 141*  BUN 13 15 25*  CREATININE 0.95 0.93 0.90  CALCIUM 9.6 9.0 9.8   CrCl cannot be calculated (Unknown ideal weight.). Liver & Pancreas: Recent Labs  Lab 10/03/23 1715 10/04/23 0508 10/05/23 0646  AST 27 20 22   ALT 16 16 20   ALKPHOS 78 64 71  BILITOT 1.3* 1.8* 1.3*  PROT 6.8 6.8 7.0  ALBUMIN  4.3 3.9 3.9   Recent Labs  Lab 10/03/23 1715  LIPASE 19   Recent Labs  Lab 10/03/23 1803  AMMONIA 54*   Diabetic: No results for input(s): "HGBA1C" in the last 72 hours. Recent Labs  Lab 10/04/23 0218 10/04/23 0300 10/04/23 0510 10/04/23 0530 10/04/23 0533  GLUCAP 66* 73 63* 247* 229*   Cardiac Enzymes: No results for input(s): "CKTOTAL", "CKMB", "CKMBINDEX", "TROPONINI" in the last 168 hours. No results for input(s): "PROBNP" in the last 8760 hours. Coagulation Profile: No results for input(s): "INR", "PROTIME" in the last 168 hours. Thyroid Function Tests: No results for input(s): "TSH", "T4TOTAL", "FREET4", "T3FREE", "THYROIDAB" in the last 72 hours. Lipid Profile: No results for input(s): "CHOL", "HDL", "LDLCALC", "TRIG", "CHOLHDL", "LDLDIRECT" in the last 72 hours. Anemia Panel: No results for input(s): "VITAMINB12", "FOLATE",  "FERRITIN", "TIBC", "IRON", "RETICCTPCT" in the last 72 hours. Urine analysis:    Component Value Date/Time   COLORURINE COLORLESS (A) 10/03/2023 1527   APPEARANCEUR CLEAR 10/03/2023 1527   LABSPEC <1.005 (L) 10/03/2023 1527   PHURINE 7.5 10/03/2023 1527   GLUCOSEU NEGATIVE 10/03/2023 1527   HGBUR SMALL (A) 10/03/2023 1527   BILIRUBINUR NEGATIVE 10/03/2023 1527   KETONESUR NEGATIVE 10/03/2023 1527   PROTEINUR NEGATIVE 10/03/2023 1527   NITRITE NEGATIVE 10/03/2023 1527   LEUKOCYTESUR NEGATIVE 10/03/2023 1527   Sepsis Labs: Invalid input(s): "PROCALCITONIN", "LACTICIDVEN"  Microbiology: No results found for this or any previous visit (from the past 240 hours).  Radiology Studies: No results found.   Scheduled Meds:  cycloSPORINE  modified  50 mg Oral BID   enoxaparin  (LOVENOX ) injection  40 mg Subcutaneous Q24H   furosemide   40 mg Oral Daily   hydrocortisone  sod succinate (SOLU-CORTEF ) inj  50 mg Intravenous Q8H   lactulose   30 g Oral TID   levothyroxine   100 mcg Oral QAC breakfast  sertraline   100 mg Oral Daily   sodium chloride  flush  3 mL Intravenous Q12H   tenofovir  alafenamide  25 mg Oral Daily   Continuous Infusions:  promethazine  (PHENERGAN ) injection (IM or IVPB)       LOS: 1 day   35 minutes with more than 50% spent in reviewing records, counseling patient/family and coordinating care.  Trenton Frock, MD [facility] [email or web address] 10/05/2023, 3:31 PM

## 2023-10-05 NOTE — Progress Notes (Signed)
 Attempted to arouse patient.  She opened her eyes about 1/3 of the way open then shut them.

## 2023-10-05 NOTE — Progress Notes (Signed)
 Pt is sleeping soundly, no distress noted, but not arouseable.  Will hold morning po meds until patient is awake and able to swallow safely

## 2023-10-06 ENCOUNTER — Inpatient Hospital Stay (HOSPITAL_COMMUNITY)

## 2023-10-06 DIAGNOSIS — R109 Unspecified abdominal pain: Secondary | ICD-10-CM | POA: Diagnosis not present

## 2023-10-06 LAB — CBC
HCT: 44.2 % (ref 36.0–46.0)
Hemoglobin: 14.7 g/dL (ref 12.0–15.0)
MCH: 33.7 pg (ref 26.0–34.0)
MCHC: 33.3 g/dL (ref 30.0–36.0)
MCV: 101.4 fL — ABNORMAL HIGH (ref 80.0–100.0)
Platelets: 199 10*3/uL (ref 150–400)
RBC: 4.36 MIL/uL (ref 3.87–5.11)
RDW: 13.6 % (ref 11.5–15.5)
WBC: 11.3 10*3/uL — ABNORMAL HIGH (ref 4.0–10.5)
nRBC: 0 % (ref 0.0–0.2)

## 2023-10-06 LAB — HEPATIC FUNCTION PANEL
ALT: 15 U/L (ref 0–44)
AST: 21 U/L (ref 15–41)
Albumin: 3.5 g/dL (ref 3.5–5.0)
Alkaline Phosphatase: 60 U/L (ref 38–126)
Bilirubin, Direct: 0.3 mg/dL — ABNORMAL HIGH (ref 0.0–0.2)
Indirect Bilirubin: 1 mg/dL — ABNORMAL HIGH (ref 0.3–0.9)
Total Bilirubin: 1.3 mg/dL — ABNORMAL HIGH (ref 0.0–1.2)
Total Protein: 6.4 g/dL — ABNORMAL LOW (ref 6.5–8.1)

## 2023-10-06 LAB — BASIC METABOLIC PANEL WITH GFR
Anion gap: 8 (ref 5–15)
BUN: 20 mg/dL (ref 6–20)
CO2: 25 mmol/L (ref 22–32)
Calcium: 8.7 mg/dL — ABNORMAL LOW (ref 8.9–10.3)
Chloride: 103 mmol/L (ref 98–111)
Creatinine, Ser: 0.78 mg/dL (ref 0.44–1.00)
GFR, Estimated: >60 mL/min (ref >60)
Glucose, Bld: 98 mg/dL (ref 70–99)
Potassium: 3.8 mmol/L (ref 3.5–5.1)
Sodium: 136 mmol/L (ref 135–145)

## 2023-10-06 MED ORDER — HYDROCORTISONE SOD SUC (PF) 100 MG IJ SOLR
50.0000 mg | Freq: Two times a day (BID) | INTRAMUSCULAR | Status: DC
  Administered 2023-10-07 – 2023-10-08 (×3): 50 mg via INTRAVENOUS
  Filled 2023-10-06 (×3): qty 2

## 2023-10-06 MED ORDER — POLYETHYLENE GLYCOL 3350 17 G PO PACK
17.0000 g | PACK | Freq: Two times a day (BID) | ORAL | Status: DC
  Administered 2023-10-06 – 2023-10-07 (×4): 17 g via ORAL
  Filled 2023-10-06 (×4): qty 1

## 2023-10-06 MED ORDER — DOCUSATE SODIUM 100 MG PO CAPS
100.0000 mg | ORAL_CAPSULE | Freq: Two times a day (BID) | ORAL | Status: DC
  Administered 2023-10-06 – 2023-10-07 (×4): 100 mg via ORAL
  Filled 2023-10-06 (×4): qty 1

## 2023-10-06 NOTE — Evaluation (Signed)
 Physical Therapy Evaluation Patient Details Name: Patricia Lutz MRN: [id number] DOB: 05/23/1985 Today's Date: 10/06/2023  History of Present Illness  38 y.o. female   who presents with abdominal pain, nausea, and vomiting and confusion. long history of abdominal pain for which she has undergone workup including multiple EGDs and MRIs. Pain improved temporarily after exploratory laparotomy with lysis of adhesions and 2022.  normal/negative CT of abdomen pelvis. Admitted to hospitalist for intractable pain. 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  Pt is independent at her baseline, has 16yo and 14yo children, spouse in [place] a lot of the time for work.  Pt is sleepy on PT arrival however arouses easily, cooperative with mobility, limited by fatigue, LE weakness;  CGA to min assist for transfers and short distance amb  Will follow in acute setting, anticipate steady progress without f/u post acute  PT needs.  Pt currently with functional limitations due to the deficits listed below (see PT Problem List). Pt will benefit from acute skilled PT to increase their independence and safety with mobility to allow discharge.           If plan is discharge home, recommend the following:     Can travel by private vehicle        Equipment Recommendations None recommended by PT  Recommendations for Other Services       Functional Status Assessment Patient has had a recent decline in their functional status and demonstrates the ability to make significant improvements in function in a reasonable and predictable amount of time.     Precautions / Restrictions Precautions Precautions: Fall Restrictions Weight Bearing Restrictions Per Provider Order: No      Mobility  Bed Mobility Overal bed mobility: Needs Assistance Bed Mobility: Supine to Sit, Sit to Supine      Supine to sit: Supervision, HOB elevated, Used rails Sit to supine: Supervision, HOB elevated   General bed mobility comments: for lines and safety, incr time and effort    Transfers Overall transfer level: Needs assistance Equipment used: None, 1 person hand held assist Transfers: Sit to/from Stand, Bed to chair/wheelchair/BSC Sit to Stand: Min assist, Contact guard assist   Step pivot transfers: Min assist       General transfer comment: STS repeated x5; incr time, light assist to rise and steady. dizziness in sitting/standing with BP 110/85-stable. HR 70s    Ambulation/Gait   Gait Distance (Feet): 5 Feet (x2) Assistive device: None, 1 person hand held assist         General Gait Details: amb a few steps recliner<>bed with assist to steady, incr time needed  Stairs            Wheelchair Mobility     Tilt Bed    Modified Rankin (Stroke Patients Only)       Balance Overall balance assessment: Needs assistance Sitting-balance support: Feet supported, No upper extremity supported Sitting balance-Leahy Scale: Good     Standing balance support: During functional activity, Single extremity supported Standing balance-Leahy Scale: Fair Standing balance comment: pt is able to stand and manipulate LB garments with CGA for safety                             Pertinent Vitals/Pain Pain Assessment Pain Assessment: Faces Faces Pain Scale: Hurts even more Pain Location: abd Pain Descriptors / Indicators:  Discomfort, Constant Pain Intervention(s): Monitored during session, Limited activity within patient's tolerance, Repositioned, Patient requesting pain meds-RN notified    Home Living Family/patient expects to be discharged to:: Private residence Living Arrangements: Spouse/significant other Available Help at Discharge: Family Type of Home: House Home Access: Stairs to enter   [employer] of Steps: 3   Home Layout: Two level;Able to  live on main level with bedroom/bathroom Home Equipment: Jeananne Mighty - single point Additional Comments: 39 yo and 55 yo children; spouse lives in [place] part-time d/t work    Prior Function Prior Level of Function : Independent/Modified Independent;Driving             Mobility Comments: amb with cane at times ADLs Comments: independent with ADLs, iADLs-does all household tasks and management     Extremity/Trunk Assessment   Upper Extremity Assessment Upper Extremity Assessment: Defer to OT evaluation;Overall [facility] for tasks assessed    Lower Extremity Assessment Lower Extremity Assessment: Generalized weakness       Communication   Communication Communication: No apparent difficulties    Cognition Arousal: Alert Behavior During Therapy: WFL for tasks assessed/performed   PT - Cognitive impairments: No apparent impairments                       PT - Cognition Comments: sleepy but arouses easily Following commands: Intact       Cueing Cueing Techniques: Verbal cues     General Comments      Exercises     Assessment/Plan    PT Assessment Patient needs continued PT services  PT Problem List Decreased strength;Decreased range of motion;Decreased activity tolerance;Decreased balance;Decreased knowledge of use of DME;Decreased mobility;Decreased knowledge of precautions;Pain       PT Treatment Interventions      PT Goals (Current goals can be found in the Care Plan section)  Acute Rehab PT Goals PT Goal Formulation: With patient Time For Goal Achievement: 10/27/23 Potential to Achieve Goals: Good    Frequency Min 2X/week     Co-evaluation               AM-PAC PT "6 Clicks" Mobility  Outcome Measure Help needed turning from your back to your side while in a flat bed without using bedrails?: A Little Help needed moving from lying on your back to sitting on the side of a flat bed without using bedrails?: A Little Help needed moving to and from a bed  to a chair (including a wheelchair)?: A Little Help needed standing up from a chair using your arms (e.g., wheelchair or bedside chair)?: A Little Help needed to walk in hospital room?: A Little Help needed climbing 3-5 steps with a railing? : A Little 6 Click Score: 18    End of Session   Activity Tolerance: Patient limited by fatigue Patient left: in bed;with call bell/phone within reach;with bed alarm set Nurse Communication: Mobility status;Patient requests pain meds PT Visit Diagnosis: Other abnormalities of gait and mobility (R26.89)    Time: 1610-9604 PT Time Calculation (min) (ACUTE ONLY): 29 min   Charges:   PT Evaluation $PT Eval Low Complexity: 1 Low PT Treatments $Therapeutic Activity: 8-22 mins PT General Charges $$ ACUTE PT VISIT: 1 Visit         Kayode Petion, PT  Acute Rehab Dept [facility]) [phone]  10/06/2023   [facility] 10/06/2023, 11:15 AM

## 2023-10-06 NOTE — Plan of Care (Signed)
 No acute events overnight. Problem: Education: Goal: Knowledge of General Education information will improve Description: Including pain rating scale, medication(s)/side effects and non-pharmacologic comfort measures Outcome: Progressing   Problem: Health Behavior/Discharge Planning: Goal: Ability to manage health-related needs will improve Outcome: Progressing   Problem: Clinical Measurements: Goal: Ability to maintain clinical measurements within normal limits will improve Outcome: Progressing Goal: Will remain free from infection Outcome: Progressing Goal: Diagnostic test results will improve Outcome: Progressing Goal: Respiratory complications will improve Outcome: Progressing Goal: Cardiovascular complication will be avoided Outcome: Progressing   Problem: Coping: Goal: Level of anxiety will decrease Outcome: Progressing   Problem: Elimination: Goal: Will not experience complications related to bowel motility Outcome: Progressing Goal: Will not experience complications related to urinary retention Outcome: Progressing   Problem: Pain Managment: Goal: General experience of comfort will improve and/or be controlled Outcome: Progressing   Problem: Safety: Goal: Ability to remain free from injury will improve Outcome: Progressing   Problem: Skin Integrity: Goal: Risk for impaired skin integrity will decrease Outcome: Progressing

## 2023-10-06 NOTE — Progress Notes (Signed)
 PROGRESS NOTE  Patricia Lutz  WUJ:[id number] DOB: 09-02-1985 DOA: 10/03/2023 PCP: Lorenza Romans, FNP  Consultants  Brief Narrative: 38 y.o. 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 Patient has a long history of abdominal pain for which she has undergone workup including multiple EGDs and MRIs.  Pain improved temporarily after exploratory laparotomy with lysis of adhesions and 2022.  Also with some confusion.  Came to [facility] ED and had normal/negative CT of abdomen pelvis.  Admitted to hospitalist for intractable pain.   Assessment & Plan: Altered mental status, Metabolic encephalopathy secondary to low blood pressure and hyperammonemia -and also sedating medications - Much less confused today.  Oriented x 4.  - still without BM.  Lactulose  increased yesterday - meds changed yesterday.  Xanax /hydromorphone  stopped, now just on oxycodone .  - This seems to be what really improved her mental status.  Especially in light of fact she has not yet had a bowel movement - Working diagnosis therefore more oversedation from medication rather than overt hyperammonemia  Adrenal insufficiency: - improving.  - She is on prednisone  3 mg chronically.  She does have hydrocortisone  at home for stress dose treatment but did not take this. - Continue hydrocortisone  taper as her pressures remain good  Intractable abdominal pain: - Initially improved since admission.  Now that confusion has abated, reports recurrence of abd pain.  Moved to oxycodone  from dilaudid  due to over-sedation from dilaudid .   - I did consult GI Dr. Honey Lusty today.  He reviewed her note from [facility] 08/12/2023 -- noted that abd pain has become chronic since transplant in 2021.  Multiple work-ups, all negative.  He had nothing else to offer, nothing to  change as far as her transplant status.   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   - contributing to abd pain above.   - KUB negative today, abd benign on exam.   Status post liver transplant: - Continue cyclosporine ,Vemlidy  - Transplant at [facility]. - Low threshold to transfer if any acute worsening.  GI with nothing to offer here currently.  - Will follow LFTs/bilirubin - On Lasix .  Hyperbilirubinemia: -Downtrending  Anxiety: - Continue Zoloft  and holding Xanax  after notable oversedation  Hypothyroidism: - TSH pending  Leukocytosis: - Secondary to hydrocortisone  use.    DVT prophylaxis:  enoxaparin  (LOVENOX ) injection 40 mg Start: 10/04/23 1000  Code Status:   Code Status: Full Code Level of care: Telemetry Status is: Inpatient   Subjective: Patient much more awake this AM.  Tired, but alert.  Still no BM.  Pain has recurred.  Appetite down.  No N/V  Objective: Vitals:   10/04/23 2001 10/05/23 0512 10/05/23 1956 10/06/23 0821  BP: 111/73 99/66 (!) 89/59 (!) 89/59  Pulse: (!) 54 61 (!) 53 (!) 53  Resp: 20 18 16 16   Temp: 98.5 F (36.9 C) 98.3 F (36.8 C) 98.3 F (36.8 C) 98.3 F (36.8 C)  TempSrc:  Oral  Oral  SpO2: 94% 95% 97%   Weight:    81.6 kg  Height:    5\' 2"  (1.575 m)    Intake/Output Summary (Last 24 hours) at 10/06/2023 1145 Last data filed at 10/06/2023 0615 Gross per 24 hour  Intake --  Output 650 ml  Net -650 ml   Filed Weights   10/06/23 0821  Weight: 81.6 kg   Body mass index is 32.92 kg/m.  Gen: 38 y.o. female in no apparent distress.   Pulm: Non-labored breathing.  Clear to auscultation bilaterally.  CV: Regular rate and rhythm. GI: Abdomen soft, nondistended.  Minimal tenderness bilateral lower quadrants without ascites.  Quiet abdomen  Ext: Warm, no  deformities,  Skin: No rashes, lesions  Neuro: Alert and oriented x 4, moving all limbs symmetrically.  Psych: Calm. Mood & affect appropriate.     I have personally reviewed the following labs and images: CBC: Recent Labs  Lab 10/03/23 1612 10/04/23 0508 10/05/23 0646 10/06/23 0729  WBC 8.0 7.7 11.9* 11.3*  HGB 14.8 14.8 14.9 14.7  HCT 43.2 44.7 44.7 44.2  MCV 96.4 101.4* 98.7 101.4*  PLT 143* 148* 167 199   BMP &GFR Recent Labs  Lab 10/03/23 1715 10/04/23 0508 10/05/23 0646 10/06/23 0729  NA 141 135 137 136  K 3.9 3.6 4.1 3.8  CL 104 102 100 103  CO2 25 22 25 25   GLUCOSE 79 53* 141* 98  BUN 13 15 25* 20  CREATININE 0.95 0.93 0.90 0.78  CALCIUM 9.6 9.0 9.8 8.7*   Estimated Creatinine Clearance: 94.4 mL/min (by C-G formula based on SCr of 0.78 mg/dL). Liver & Pancreas: Recent Labs  Lab 10/03/23 1715 10/04/23 0508 10/05/23 0646  AST 27 20 22   ALT 16 16 20   ALKPHOS 78 64 71  BILITOT 1.3* 1.8* 1.3*  PROT 6.8 6.8 7.0  ALBUMIN  4.3 3.9 3.9   Recent Labs  Lab 10/03/23 1715  LIPASE 19   Recent Labs  Lab 10/03/23 1803  AMMONIA 54*   Diabetic: No results for input(s): "HGBA1C" in the last 72 hours. Recent Labs  Lab 10/04/23 0218 10/04/23 0300 10/04/23 0510 10/04/23 0530 10/04/23 0533  GLUCAP 66* 73 63* 247* 229*   Cardiac Enzymes: No results for input(s): "CKTOTAL", "CKMB", "CKMBINDEX", "TROPONINI" in the last 168 hours. No results for input(s): "PROBNP" in the last 8760 hours. Coagulation Profile: No results for input(s): "INR", "PROTIME" in the last 168 hours. Thyroid Function Tests: No results for input(s): "TSH", "T4TOTAL", "FREET4", "T3FREE", "THYROIDAB" in the last 72 hours. Lipid Profile: No results for input(s): "CHOL", "HDL", "LDLCALC", "TRIG", "CHOLHDL", "LDLDIRECT" in the last 72 hours. Anemia Panel: No results for input(s): "VITAMINB12", "FOLATE", "FERRITIN", "TIBC", "IRON", "RETICCTPCT" in the last 72 hours. Urine analysis:     Component Value Date/Time   COLORURINE COLORLESS (A) 10/03/2023 1527   APPEARANCEUR CLEAR 10/03/2023 1527   LABSPEC <1.005 (L) 10/03/2023 1527   PHURINE 7.5 10/03/2023 1527   GLUCOSEU NEGATIVE 10/03/2023 1527   HGBUR SMALL (A) 10/03/2023 1527   BILIRUBINUR NEGATIVE 10/03/2023 1527   KETONESUR NEGATIVE 10/03/2023 1527   PROTEINUR NEGATIVE 10/03/2023 1527   NITRITE NEGATIVE 10/03/2023 1527   LEUKOCYTESUR NEGATIVE 10/03/2023 1527   Sepsis Labs: Invalid input(s): "PROCALCITONIN", "LACTICIDVEN"  Microbiology: No results found for this or any previous visit (from the past 240 hours).  Radiology Studies: No results found.   Scheduled Meds:  cycloSPORINE  modified  50 mg Oral BID   enoxaparin  (LOVENOX ) injection  40 mg Subcutaneous Q24H   feeding supplement  1 Container Oral TID BM   furosemide   40 mg Oral Daily   hydrocortisone  sod succinate (SOLU-CORTEF ) inj  50 mg Intravenous Q8H   lactulose   30 g Oral TID   levothyroxine   100 mcg Oral QAC breakfast  sertraline   100 mg Oral Daily   sodium chloride  flush  3 mL Intravenous Q12H   tenofovir  alafenamide  25 mg Oral Daily   Continuous Infusions:  promethazine  (PHENERGAN ) injection (IM or IVPB)       LOS: 2 days   35 minutes with more than 50% spent in reviewing records, counseling patient/family and coordinating care.  Trenton Frock, MD [facility] [email or web address] 10/06/2023, 11:45 AM

## 2023-10-07 ENCOUNTER — Other Ambulatory Visit (HOSPITAL_COMMUNITY): Payer: Self-pay

## 2023-10-07 DIAGNOSIS — R109 Unspecified abdominal pain: Secondary | ICD-10-CM | POA: Diagnosis not present

## 2023-10-07 DIAGNOSIS — R1084 Generalized abdominal pain: Secondary | ICD-10-CM

## 2023-10-07 LAB — COMPREHENSIVE METABOLIC PANEL WITH GFR
ALT: 18 U/L (ref 0–44)
AST: 14 U/L — ABNORMAL LOW (ref 15–41)
Albumin: 3.4 g/dL — ABNORMAL LOW (ref 3.5–5.0)
Alkaline Phosphatase: 59 U/L (ref 38–126)
Anion gap: 11 (ref 5–15)
BUN: 17 mg/dL (ref 6–20)
CO2: 24 mmol/L (ref 22–32)
Calcium: 8.6 mg/dL — ABNORMAL LOW (ref 8.9–10.3)
Chloride: 100 mmol/L (ref 98–111)
Creatinine, Ser: 0.72 mg/dL (ref 0.44–1.00)
GFR, Estimated: >60 mL/min (ref >60)
Glucose, Bld: 109 mg/dL — ABNORMAL HIGH (ref 70–99)
Potassium: 3.2 mmol/L — ABNORMAL LOW (ref 3.5–5.1)
Sodium: 135 mmol/L (ref 135–145)
Total Bilirubin: 1.9 mg/dL — ABNORMAL HIGH (ref 0.0–1.2)
Total Protein: 6.1 g/dL — ABNORMAL LOW (ref 6.5–8.1)

## 2023-10-07 LAB — CBC
HCT: 44.2 % (ref 36.0–46.0)
Hemoglobin: 14.7 g/dL (ref 12.0–15.0)
MCH: 33.8 pg (ref 26.0–34.0)
MCHC: 33.3 g/dL (ref 30.0–36.0)
MCV: 101.6 fL — ABNORMAL HIGH (ref 80.0–100.0)
Platelets: 130 10*3/uL — ABNORMAL LOW (ref 150–400)
RBC: 4.35 MIL/uL (ref 3.87–5.11)
RDW: 13.2 % (ref 11.5–15.5)
WBC: 8.7 10*3/uL (ref 4.0–10.5)
nRBC: 0 % (ref 0.0–0.2)

## 2023-10-07 MED ORDER — OXYCODONE HCL 5 MG PO TABS
5.0000 mg | ORAL_TABLET | Freq: Four times a day (QID) | ORAL | 0 refills | Status: AC | PRN
  Filled 2023-10-07: qty 30, 8d supply, fill #0

## 2023-10-07 MED ORDER — LACTULOSE 10 GM/15ML PO SOLN
30.0000 g | Freq: Two times a day (BID) | ORAL | 0 refills | Status: AC
  Filled 2023-10-07: qty 946, 11d supply, fill #0

## 2023-10-07 MED ORDER — POLYETHYLENE GLYCOL 3350 17 GM/SCOOP PO POWD
17.0000 g | Freq: Two times a day (BID) | ORAL | 0 refills | Status: AC
  Filled 2023-10-07: qty 238, 7d supply, fill #0

## 2023-10-07 MED ORDER — POTASSIUM CHLORIDE 10 MEQ/100ML IV SOLN
10.0000 meq | INTRAVENOUS | Status: DC
  Administered 2023-10-07: 10 meq via INTRAVENOUS
  Filled 2023-10-07: qty 100

## 2023-10-07 MED ORDER — PROMETHAZINE HCL 50 MG RE SUPP
50.0000 mg | Freq: Four times a day (QID) | RECTAL | 0 refills | Status: AC | PRN
  Filled 2023-10-07: qty 12, 3d supply, fill #0

## 2023-10-07 MED ORDER — POTASSIUM CHLORIDE 20 MEQ PO PACK
40.0000 meq | PACK | ORAL | Status: AC
  Administered 2023-10-07 (×2): 40 meq via ORAL
  Filled 2023-10-07 (×2): qty 2

## 2023-10-07 MED ORDER — DOCUSATE SODIUM 100 MG PO CAPS
100.0000 mg | ORAL_CAPSULE | Freq: Two times a day (BID) | ORAL | 0 refills | Status: AC
  Filled 2023-10-07: qty 10, 5d supply, fill #0

## 2023-10-07 MED ORDER — LACTULOSE ENEMA
300.0000 mL | Freq: Once | ORAL | Status: AC
  Administered 2023-10-07: 300 mL via RECTAL
  Filled 2023-10-07: qty 300

## 2023-10-07 NOTE — Discharge Instructions (Addendum)
Follow up with primary care MD

## 2023-10-07 NOTE — Progress Notes (Signed)
 Chaplains received a consult to assist Patricia Lutz with [facility] paperwork.  Attempted to meet with her, but she was asleep.  I will try back tomorrow.

## 2023-10-07 NOTE — Discharge Summary (Signed)
 Physician Discharge Summary  Patricia Lutz WJX:[id number] DOB: 1985/10/25 DOA: 10/03/2023  PCP: Lorenza Romans, FNP  Admit date: 10/03/2023 Discharge date: 10/07/2023  Admitted From: Home  Disposition:  Home  Recommendations for Outpatient Follow-up:  Follow up with PCP in 1-2 weeks  Home Health: None  Equipment/Devices: None  Discharge Condition: Stable CODE STATUS: Full Diet recommendation: Full liquid diet  Brief/Interim Summary: 38 y.o. 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   Patient admitted as above with abdominal pain nausea vomiting, appears to be chronic she reports greater than 6 months induration, previously followed by [facility] and [facility] with multiple imaging, endoscopy and medication attempts.  Has also undergone exploratory laparotomy with lysis of adhesions in 2022.  Imaging here remains negative, patient's labs are equally unremarkable.  Her symptoms have improved somewhat, her mental status has now returned to baseline after cessation of multiple sedating narcotics and CNS depressants.  Given patient's mental status improvement, tolerance of p.o. liquid she is otherwise stable for discharge home.  Recommend patient remain on liquid diet until otherwise instructed by primary team. Recommend close follow-up with outpatient team for further evaluation and treatment, patient frustrated that " no one can figure out what is wrong" but we discussed that she is currently undergoing evaluation by multiple specialties and multiple facilities and that at this time repeating recent imaging or endoscopy would increase her risk of side effects without improving any of her chronic symptoms.   Discharge Diagnoses:  Principal Problem:   Intractable abdominal pain Active Problems:   Liver  transplant status (HCC)   Adrenal insufficiency (Addison's disease) (HCC)   Chronic indwelling Foley catheter   GAD (generalized anxiety disorder)   Hypothyroidism   Altered mental status  Metabolic versus toxic encephalopathy, multifactorial Polypharmacy Hypotension Hyperammonemia -Awake alert oriented now for over 24 hours -Continue lactulose , discontinue Xanax , hydromorphone  -recommend outpatient follow-up with primary pain team for further adjustment.   Adrenal insufficiency: - improving.  - She is on prednisone  3 mg chronically.  She does have hydrocortisone  at home for stress dose treatment but did not take this -would recommend hydrocortisone  taper as prescribed by her outpatient team when she returns home.   Chronic intractable abdominal pain, nausea, vomiting Constipation, likely iatrogenic - Appears to be chronic/recurrent followed at both [facility] and [facility]. - See above, [facility] recently recommended initiating TCAs versus SNRIs but will defer to their expertise at follow-up - Continue full liquid diet as tolerated - Continue bowel regimen, lactulose , KUB negative, abdomen benign -Will need to monitor bowel regimen closely given recently increased narcotic burden by primary team's   Liver transplant patient: - Continue cyclosporine ,Vemlidy  - Transplant at [facility]. - Labs generally stable, continue furosemide  for volume management   Hyperbilirubinemia:-Labile but generally stable <2 Anxiety: - Continue Zoloft  and holding Xanax  after notable oversedation Hypothyroidism:-Continue home regimen, follow-up with PCP for testing Leukocytosis, reactive: - Secondary to steroid use Discharge Instructions  Discharge Instructions     Call MD for:  difficulty breathing, headache or visual disturbances   Complete by: As directed    Call MD for:  extreme fatigue   Complete by: As directed    Call MD for:  hives   Complete by: As directed    Call MD for:  persistant dizziness or  light-headedness   Complete by: As directed    Call MD for:  persistant nausea and vomiting   Complete by: As directed    Call MD for:  severe uncontrolled pain   Complete by: As directed    Call MD for:  temperature >100.4   Complete by: As directed    Diet - low sodium heart healthy   Complete by: As directed    Increase activity slowly   Complete by: As directed       Allergies as of 10/07/2023       Reactions   Ibuprofen Nausea And Vomiting, Other (See Comments)   Not able to take due to liver        Medication List     STOP taking these medications    ALPRAZolam  0.5 MG tablet Commonly known as: XANAX        TAKE these medications    Calcium Citrate-Vitamin D3 315-6.25 MG-MCG Tabs Take 2 tablets by mouth in the morning and at bedtime.   CRANBERRY PO Take 1 tablet by mouth every morning.   cycloSPORINE  modified 25 MG capsule Commonly known as: NEORAL  Take 50 mg by mouth in the morning and at bedtime.   docusate sodium  100 MG capsule Commonly known as: COLACE Take 1 capsule (100 mg total) by mouth 2 (two) times daily.   famotidine  40 MG tablet Commonly known as: PEPCID  Take 40 mg by mouth at bedtime.   ferrous sulfate 325 (65 FE) MG tablet Take 325 mg by mouth daily.   furosemide  20 MG tablet Commonly known as: LASIX  Take 40 mg by mouth daily.   lactulose  10 GM/15ML solution Commonly known as: CHRONULAC  Take 45 mLs (30 g total) by mouth 2 (two) times daily.   lansoprazole 15 MG capsule Commonly known as: PREVACID Take 15 mg by mouth 2 (two) times daily before a meal.   levothyroxine  100 MCG tablet Commonly known as: SYNTHROID  Take 100 mcg by mouth daily before breakfast.   magnesium  oxide 400 MG tablet Commonly known as: MAG-OX Take 2 tablets by mouth 2 (two) times daily.   ondansetron  4 MG disintegrating tablet Commonly known as: ZOFRAN -ODT Take 1 tablet (4 mg total) by mouth every 8 (eight) hours as needed. What changed:  when to  take this reasons to take this   oxyCODONE  5 MG immediate release tablet Commonly known as: Oxy IR/ROXICODONE  Take 1 tablet (5 mg total) by mouth every 6 (six) hours as needed for moderate pain (pain score 4-6). What changed:  medication strength how much to take when to take this reasons to take this   polyethylene glycol powder 17 GM/SCOOP powder Commonly known as: GLYCOLAX /MIRALAX  Take 17 grams by mouth 2 (two) times daily.   predniSONE  1 MG tablet Commonly known as: DELTASONE  Take by mouth. *4 tablets every morning and 3 tablets daily at 3 pm*   promethazine  50 MG suppository Commonly known as: PHENERGAN  Place 1 suppository (50 mg total) rectally every 6 (six) hours as needed for nausea or vomiting.   sertraline  100 MG tablet Commonly known as: ZOLOFT  Take 100 mg by mouth daily.   Vemlidy  25 MG tablet Generic drug: tenofovir  alafenamide Take 1 tablet by mouth daily.        Allergies  Allergen Reactions   Ibuprofen Nausea And Vomiting and Other (See Comments)    Not able to take due to liver    Consultations: None  Procedures/Studies: DG Abd 1 View Result Date: 10/06/2023 CLINICAL DATA:  [id number] Abdominal pain [id number] EXAM: ABDOMEN - 1 VIEW COMPARISON:  CT abdomen pelvis 10/03/2023.  FINDINGS: T-shaped intrauterine device overlying the pelvis with exact positioning unclear on radiography-better evaluated on CT abdomen pelvis 10/03/2023. The bowel gas pattern is normal. No radio-opaque calculi or other significant radiographic abnormality are seen. IMPRESSION: Negative. Electronically Signed   By: Morgane  Naveau M.D.   On: 10/06/2023 12:16   CT ABDOMEN PELVIS W CONTRAST Result Date: 10/03/2023 CLINICAL DATA:  Abdominal pain, nausea and vomiting for 1 week, history of liver transplant EXAM: CT ABDOMEN AND PELVIS WITH CONTRAST TECHNIQUE: Multidetector CT imaging of the abdomen and pelvis was performed using the standard protocol following bolus administration of  intravenous contrast. RADIATION DOSE REDUCTION: This exam was performed according to the departmental dose-optimization program which includes automated exposure control, adjustment of the mA and/or kV according to patient size and/or use of iterative reconstruction technique. CONTRAST:  OMNIPAQUE  IOHEXOL  300 MG/ML  SOLN COMPARISON:  09/18/2023 FINDINGS: Lower chest: No acute pleural or parenchymal lung disease. Hepatobiliary: Gallbladder is surgically absent. Stable hepatic cysts. Mild biliary duct dilation unchanged, likely physiologic after cholecystectomy and liver transplant. Pancreas: Unremarkable. No pancreatic ductal dilatation or surrounding inflammatory changes. Spleen: Stable splenomegaly.  No focal abnormalities. Adrenals/Urinary Tract: Kidneys enhance normally and symmetrically. No urinary tract calculi or obstructive uropathy. Partial duplication of the left ureter. The adrenals are unremarkable. Bladder is decompressed with a suprapubic catheter, with nonspecific bladder wall thickening likely due to decompressed state. Stomach/Bowel: No bowel obstruction or ileus. The appendix, if still present, is not well visualized. No bowel wall thickening or inflammatory change. Vascular/Lymphatic: Portal vein is patent. Esophageal, gastric, splenic, and mesenteric varices are noted. No pathologic adenopathy. Reproductive: IUD again noted within the endometrial cavity. Normal bilateral ovarian follicles. Other: No free fluid or free intraperitoneal gas. No abdominal wall hernia. Musculoskeletal: No acute or destructive bony abnormalities. Reconstructed images demonstrate no additional findings. IMPRESSION: 1. Stable postsurgical changes of liver transplant. 2. Evidence of portal venous hypertension manifested by abdominal varices and splenomegaly. No change since prior study. 3. No acute intra-abdominal or intrapelvic process. Electronically Signed   By: Bobbye Burrow M.D.   On: 10/03/2023 20:43   CT  ABDOMEN PELVIS W CONTRAST Result Date: 09/18/2023 CLINICAL DATA:  Acute nonlocalized abdominal pain. Nausea, vomiting, and abdominal distention. History of liver transplant. EXAM: CT ABDOMEN AND PELVIS WITH CONTRAST TECHNIQUE: Multidetector CT imaging of the abdomen and pelvis was performed using the standard protocol following bolus administration of intravenous contrast. RADIATION DOSE REDUCTION: This exam was performed according to the departmental dose-optimization program which includes automated exposure control, adjustment of the mA and/or kV according to patient size and/or use of iterative reconstruction technique. CONTRAST:  OMNIPAQUE  IOHEXOL  300 MG/ML  SOLN COMPARISON:  06/27/2023 FINDINGS: Lower chest: Mild dependent atelectasis in the lung bases. Hepatobiliary: Surgical absence of the gallbladder. Mild bile duct dilatation is unchanged since previous study, likely physiologic post cholecystectomy. No stones identified. Pancreas: Unremarkable. No pancreatic ductal dilatation or surrounding inflammatory changes. Spleen: Spleen is enlarged.  No focal lesions. Adrenals/Urinary Tract: Adrenal glands are unremarkable. Kidneys are normal, without renal calculi, focal lesion, or hydronephrosis. Bladder is decompressed with a suprapubic catheter. Stomach/Bowel: Stomach, small bowel, and colon are not abnormally distended. There is a focal area of narrowing and wall thickening in the rectosigmoid junction. This could represent muscular hypertrophy but a colonic mass lesion could also have this appearance. Consider colonoscopy for further evaluation. No proximal obstruction. Otherwise, no wall thickening or inflammatory changes. Stool throughout the colon. Appendix is not identified. Vascular/Lymphatic: Normal caliber abdominal  aorta. No aneurysm or dissection. Upper abdominal, paraesophageal, and retroperitoneal varices. Reproductive: Uterus and ovaries are not enlarged. An intrauterine device is present.  Other: No free air or free fluid in the abdomen. Abdominal wall musculature appears intact. Musculoskeletal: Slight anterior subluxation of L4 on L5 is likely degenerative. No vertebral compression deformities. IMPRESSION: 1. Mild bile duct dilatation is likely normal for postoperative physiology. No stones identified. 2. Spleen is enlarged. 3. Paraesophageal and abdominal varices. 4. Nonspecific focal wall thickening at the rectosigmoid junction possibly representing a focal: Mass. Consider colonoscopy for further evaluation. 5. Suprapubic catheter in the bladder.  Intrauterine device. Electronically Signed   By: Boyce Byes M.D.   On: 09/18/2023 19:57     Subjective: No acute issues or events overnight, reports improving mental status nausea vomiting.  Abdominal pain ongoing -likely near her baseline.   Discharge Exam: Vitals:   10/07/23 0438 10/07/23 1154  BP: 103/70 (!) 124/90  Pulse: (!) 41 (!) 43  Resp: 16 16  Temp: 98.3 F (36.8 C) 97.7 F (36.5 C)  SpO2: 96% 96%   Vitals:   10/06/23 1353 10/06/23 2128 10/07/23 0438 10/07/23 1154  BP: 115/78 105/72 103/70 (!) 124/90  Pulse: (!) 49 (!) 45 (!) 41 (!) 43  Resp: 16 16 16 16   Temp: 98.7 F (37.1 C) 98.3 F (36.8 C) 98.3 F (36.8 C) 97.7 F (36.5 C)  TempSrc:    Oral  SpO2: 96% 95% 96% 96%  Weight:      Height:        General: Pt is alert, awake, not in acute distress Cardiovascular: RRR, S1/S2 +, no rubs, no gallops Respiratory: CTA bilaterally, no wheezing, no rhonchi Abdominal: Soft, NT, ND, bowel sounds + Extremities: no edema, no cyanosis    The results of significant diagnostics from this hospitalization (including imaging, microbiology, ancillary and laboratory) are listed below for reference.     Microbiology: No results found for this or any previous visit (from the past 240 hours).   Labs: BNP (last 3 results) No results for input(s): "BNP" in the last 8760 hours. Basic Metabolic Panel: Recent Labs   Lab 10/03/23 1715 10/04/23 0508 10/05/23 0646 10/06/23 0729 10/07/23 0505  NA 141 135 137 136 135  K 3.9 3.6 4.1 3.8 3.2*  CL 104 102 100 103 100  CO2 25 22 25 25 24   GLUCOSE 79 53* 141* 98 109*  BUN 13 15 25* 20 17  CREATININE 0.95 0.93 0.90 0.78 0.72  CALCIUM 9.6 9.0 9.8 8.7* 8.6*   Liver Function Tests: Recent Labs  Lab 10/03/23 1715 10/04/23 0508 10/05/23 0646 10/06/23 0728 10/07/23 0505  AST 27 20 22 21  14*  ALT 16 16 20 15 18   ALKPHOS 78 64 71 60 59  BILITOT 1.3* 1.8* 1.3* 1.3* 1.9*  PROT 6.8 6.8 7.0 6.4* 6.1*  ALBUMIN  4.3 3.9 3.9 3.5 3.4*   Recent Labs  Lab 10/03/23 1715  LIPASE 19   Recent Labs  Lab 10/03/23 1803  AMMONIA 54*   CBC: Recent Labs  Lab 10/03/23 1612 10/04/23 0508 10/05/23 0646 10/06/23 0729 10/07/23 0505  WBC 8.0 7.7 11.9* 11.3* 8.7  HGB 14.8 14.8 14.9 14.7 14.7  HCT 43.2 44.7 44.7 44.2 44.2  MCV 96.4 101.4* 98.7 101.4* 101.6*  PLT 143* 148* 167 199 130*   Cardiac Enzymes: No results for input(s): "CKTOTAL", "CKMB", "CKMBINDEX", "TROPONINI" in the last 168 hours. BNP: Invalid input(s): "POCBNP" CBG: Recent Labs  Lab 10/04/23 0218 10/04/23 0300 10/04/23  0510 10/04/23 0530 10/04/23 0533  GLUCAP 66* 73 63* 247* 229*   Urinalysis    Component Value Date/Time   COLORURINE COLORLESS (A) 10/03/2023 1527   APPEARANCEUR CLEAR 10/03/2023 1527   LABSPEC <1.005 (L) 10/03/2023 1527   PHURINE 7.5 10/03/2023 1527   GLUCOSEU NEGATIVE 10/03/2023 1527   HGBUR SMALL (A) 10/03/2023 1527   BILIRUBINUR NEGATIVE 10/03/2023 1527   KETONESUR NEGATIVE 10/03/2023 1527   PROTEINUR NEGATIVE 10/03/2023 1527   NITRITE NEGATIVE 10/03/2023 1527   LEUKOCYTESUR NEGATIVE 10/03/2023 1527   Sepsis Labs Recent Labs  Lab 10/04/23 0508 10/05/23 0646 10/06/23 0729 10/07/23 0505  WBC 7.7 11.9* 11.3* 8.7   Microbiology No results found for this or any previous visit (from the past 240 hours).   Time coordinating discharge: Over 30  minutes  SIGNED:   Haydee Lipa, DO [facility] 10/07/2023, 2:03 PM Pager   If 7PM-7AM, please contact night-coverage [email or web address]

## 2023-10-07 NOTE — Plan of Care (Signed)

## 2023-10-08 ENCOUNTER — Other Ambulatory Visit (HOSPITAL_COMMUNITY): Payer: Self-pay

## 2023-10-08 NOTE — Progress Notes (Signed)
 Patient and family requesting to speak with house supervisor.  Patient is refusing to be discharged.  Discharge orders written on 5/19, discharge summary completed on 5/19 at 2:04 pm.  Patient feels as if is she is too weak to be discharged and is concerned with lab values.  Patient and family requesting information related to discharge appeal.  This Clinical research associate spoke with case manager at [facility].  Case manager stated that the appeal process applied to medicare patients only. Patient also stated that she spoke with unit director and was told that she would not be discharged tonight.  This Clinical research associate informed patient and spouse that the physician is the only person who can cancel discharge orders.  Due to the late hour and request by patient and spouse to speak with attending physician; decision was made for patient to stay over night.  This information was communicated to floor coverage provider.

## 2023-10-08 NOTE — Progress Notes (Signed)
 TOC meds in a secure bag delivered to pt in room by this RN.

## 2023-12-25 ENCOUNTER — Emergency Department (HOSPITAL_BASED_OUTPATIENT_CLINIC_OR_DEPARTMENT_OTHER)
Admission: EM | Admit: 2023-12-25 | Discharge: 2023-12-25 | Disposition: A | Discharge status: Home / Self Care | Attending: Emergency Medicine | Admitting: Emergency Medicine

## 2023-12-25 ENCOUNTER — Encounter (HOSPITAL_BASED_OUTPATIENT_CLINIC_OR_DEPARTMENT_OTHER): Payer: Self-pay | Admitting: Emergency Medicine

## 2023-12-25 ENCOUNTER — Emergency Department (HOSPITAL_BASED_OUTPATIENT_CLINIC_OR_DEPARTMENT_OTHER)

## 2023-12-25 ENCOUNTER — Other Ambulatory Visit: Payer: Self-pay

## 2023-12-25 DIAGNOSIS — R109 Unspecified abdominal pain: Secondary | ICD-10-CM | POA: Diagnosis present

## 2023-12-25 DIAGNOSIS — R1084 Generalized abdominal pain: Secondary | ICD-10-CM | POA: Insufficient documentation

## 2023-12-25 DIAGNOSIS — E119 Type 2 diabetes mellitus without complications: Secondary | ICD-10-CM | POA: Diagnosis not present

## 2023-12-25 DIAGNOSIS — E039 Hypothyroidism, unspecified: Secondary | ICD-10-CM | POA: Diagnosis not present

## 2023-12-25 LAB — URINALYSIS, ROUTINE W REFLEX MICROSCOPIC
Bilirubin Urine: NEGATIVE
Glucose, UA: NEGATIVE mg/dL
Hgb urine dipstick: NEGATIVE
Ketones, ur: NEGATIVE mg/dL
Leukocytes,Ua: NEGATIVE
Nitrite: NEGATIVE
Protein, ur: NEGATIVE mg/dL
Specific Gravity, Urine: <1.005 — ABNORMAL LOW (ref 1.005–1.030)
pH: 7.5 (ref 5.0–8.0)

## 2023-12-25 LAB — CBC WITH DIFFERENTIAL/PLATELET
Abs Immature Granulocytes: 0.02 K/uL (ref 0.00–0.07)
Basophils Absolute: 0 K/uL (ref 0.0–0.1)
Basophils Relative: 0 %
Eosinophils Absolute: 0.1 K/uL (ref 0.0–0.5)
Eosinophils Relative: 1 %
HCT: 44.4 % (ref 36.0–46.0)
Hemoglobin: 15.5 g/dL — ABNORMAL HIGH (ref 12.0–15.0)
Immature Granulocytes: 0 %
Lymphocytes Relative: 21 %
Lymphs Abs: 1.3 K/uL (ref 0.7–4.0)
MCH: 33.9 pg (ref 26.0–34.0)
MCHC: 34.9 g/dL (ref 30.0–36.0)
MCV: 97.2 fL (ref 80.0–100.0)
Monocytes Absolute: 0.5 K/uL (ref 0.1–1.0)
Monocytes Relative: 7 %
Neutro Abs: 4.6 K/uL (ref 1.7–7.7)
Neutrophils Relative %: 71 %
Platelets: 159 K/uL (ref 150–400)
RBC: 4.57 MIL/uL (ref 3.87–5.11)
RDW: 12 % (ref 11.5–15.5)
WBC: 6.5 K/uL (ref 4.0–10.5)
nRBC: 0 % (ref 0.0–0.2)

## 2023-12-25 LAB — COMPREHENSIVE METABOLIC PANEL WITH GFR
ALT: 24 U/L (ref 0–44)
AST: 30 U/L (ref 15–41)
Albumin: 4.4 g/dL (ref 3.5–5.0)
Alkaline Phosphatase: 79 U/L (ref 38–126)
Anion gap: 13 (ref 5–15)
BUN: 17 mg/dL (ref 6–20)
CO2: 27 mmol/L (ref 22–32)
Calcium: 10 mg/dL (ref 8.9–10.3)
Chloride: 100 mmol/L (ref 98–111)
Creatinine, Ser: 1.02 mg/dL — ABNORMAL HIGH (ref 0.44–1.00)
GFR, Estimated: >60 mL/min (ref >60)
Glucose, Bld: 105 mg/dL — ABNORMAL HIGH (ref 70–99)
Potassium: 4 mmol/L (ref 3.5–5.1)
Sodium: 140 mmol/L (ref 135–145)
Total Bilirubin: 0.9 mg/dL (ref 0.0–1.2)
Total Protein: 7 g/dL (ref 6.5–8.1)

## 2023-12-25 LAB — LIPASE, BLOOD: Lipase: 22 U/L (ref 11–51)

## 2023-12-25 LAB — LACTIC ACID, PLASMA: Lactic Acid, Venous: 1 mmol/L (ref 0.5–1.9)

## 2023-12-25 LAB — PROTIME-INR
INR: 1 (ref 0.8–1.2)
Prothrombin Time: 13.4 s (ref 11.4–15.2)

## 2023-12-25 MED ORDER — HYDROMORPHONE HCL 1 MG/ML IJ SOLN
1.0000 mg | Freq: Once | INTRAMUSCULAR | Status: AC
  Administered 2023-12-25: 1 mg via INTRAVENOUS
  Filled 2023-12-25: qty 1

## 2023-12-25 MED ORDER — IOHEXOL 300 MG/ML  SOLN
100.0000 mL | Freq: Once | INTRAMUSCULAR | Status: AC | PRN
  Administered 2023-12-25: 100 mL via INTRAVENOUS

## 2023-12-25 MED ORDER — HYDROMORPHONE HCL 1 MG/ML IJ SOLN
INTRAMUSCULAR | Status: DC
Start: 2023-12-25 — End: 2023-12-25
  Filled 2023-12-25: qty 1

## 2023-12-25 MED ORDER — ONDANSETRON HCL 4 MG/2ML IJ SOLN
4.0000 mg | Freq: Once | INTRAMUSCULAR | Status: AC
  Administered 2023-12-25: 4 mg via INTRAVENOUS
  Filled 2023-12-25 (×2): qty 2

## 2023-12-25 MED ORDER — ONDANSETRON 4 MG PO TBDP: 4.0000 mg | ORAL_TABLET | Freq: Three times a day (TID) | ORAL | 0 refills | Status: AC | PRN

## 2023-12-25 NOTE — ED Notes (Signed)
 DC paperwork given and verbally understood.

## 2023-12-25 NOTE — ED Provider Notes (Signed)
 [place] EMERGENCY DEPARTMENT AT [facility] Provider Note   CSN: [id number] Arrival date & time: 12/25/23  1213     Patient presents with: Abdominal Pain   Patricia Lutz is a 38 y.o. female.   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    Per her transplant notes --history of abdominal pain which has been extensively assessed without apparent reason on several occasions, she had an exploratory laparotomy July 2022 that revealed adhesions that were lysed after which her pain did improve only to recur with more severity, had workup including an EGD, saw GI who recommended methylnaltrexone, had a second opinion at [facility] with no further recommendations Go to pain clinic now, pain clinic wanted second opinion about the pain.  This pain is not the same, one week ago began to have specific pain in RUQ, labs looked ok, no fever, but pain getting worse ane worse, sleeping, on morphine  and oxy, got progressively worse until vomiting bile on self, wasn't eating, was able to keep down 2 protein shakes> took lactulose  had BM. This morning could not walk around due to the pain, her chronic pain is chronic ache more laterally, and this is more sharp pain epigastric pain.  Feels like intense waves of pain,  reminds her of when she had pancreatitis.  Nausea and vomiting. Out of zofran , smelling alcohol pads not helping.  No change in BM, had not had one for 3-4 days, took the lactulose  and had normal BM, was a lot but normal. Don't think urinary symptoms, suprapubic catheter, no problems with it draining  No cough, cp or dyspnea Chills, no known fevers  No changes in medicine recently, trying to change pain meds but has been one month   No etoh, drug use nor cigarettes  Past Medical History:  Diagnosis Date   Anxiety    Cirrhosis (HCC)    Depression    Diabetes mellitus without complication (HCC)    GERD (gastroesophageal reflux disease)    Hashimoto's disease    History of stomach ulcers    Hypothyroidism    Substance abuse (HCC)    alcohol     Prior to Admission medications   Medication Sig Start Date End Date Taking? Authorizing Provider  ondansetron  (ZOFRAN -ODT) 4 MG disintegrating tablet Take 1 tablet (4 mg total) by mouth every 8 (eight) hours as needed for up to 20 doses for nausea or vomiting. 12/25/23  Yes Cottie Donnice PARAS, MD  Calcium Citrate-Vitamin D3 315-6.25 MG-MCG TABS Take 2 tablets by mouth in the morning and at bedtime. 09/28/19   [provider]  CRANBERRY PO Take 1 tablet by mouth every morning.    [provider]  cycloSPORINE  modified (NEORAL ) 25 MG capsule Take 50 mg by mouth in the morning and at bedtime. 12/16/20   [provider]  docusate sodium  (COLACE) 100 MG capsule Take 1 capsule (  100 mg total) by mouth 2 (two) times daily. 10/07/23   Lue Elsie BROCKS, MD  famotidine  (PEPCID ) 40 MG tablet Take 40 mg by mouth at bedtime.    [provider]  ferrous sulfate 325 (65 FE) MG tablet Take 325 mg by mouth daily. 06/23/23   [provider]  furosemide  (LASIX ) 20 MG tablet Take 40 mg by mouth daily. Patient not taking: Reported on 10/04/2023    [provider]  lactulose  (CHRONULAC ) 10 GM/15ML solution Take 45  mLs (30 g total) by mouth 2 (two) times daily. 10/07/23   Lue Elsie BROCKS, MD  lansoprazole (PREVACID) 15 MG capsule Take 15 mg by mouth 2 (two) times daily before a meal.    [provider]  levothyroxine  (SYNTHROID ) 100 MCG tablet Take 100 mcg by mouth daily before breakfast.    [provider]  magnesium  oxide (MAG-OX) 400 MG tablet Take 2 tablets by mouth 2 (two) times daily. 11/01/20   [provider]  ondansetron  (ZOFRAN -ODT) 4 MG disintegrating tablet Take 1 tablet (4 mg total) by mouth every 8 (eight) hours as needed. Patient taking differently: Take 4 mg by mouth as needed for nausea or vomiting. 03/11/23   Silver Wonda LABOR, PA  oxyCODONE  (OXY IR/ROXICODONE ) 5 MG immediate release tablet Take 1 tablet (5 mg total) by mouth every 6 (six) hours as needed for moderate pain (pain score 4-6). 10/07/23   Lue Elsie BROCKS, MD  polyethylene glycol powder (GLYCOLAX /MIRALAX ) 17 GM/SCOOP powder Take 17 grams by mouth 2 (two) times daily. 10/07/23   Lue Elsie BROCKS, MD  predniSONE  (DELTASONE ) 1 MG tablet Take by mouth. *4 tablets every morning and 3 tablets daily at 3 pm* 06/17/23   [provider]  promethazine  (PHENERGAN ) 50 MG suppository Place 1 suppository (50 mg total) rectally every 6 (six) hours as needed for nausea or vomiting. 10/07/23   Lue Elsie BROCKS, MD  sertraline  (ZOLOFT ) 100 MG tablet Take 100 mg by mouth daily. 06/05/23   [provider]  VEMLIDY  25 MG TABS Take 1 tablet by mouth daily. 11/30/20   [provider]    Allergies: Ibuprofen    Review of Systems  Updated Vital Signs BP 104/79   Pulse 74   Temp 98.1 F (36.7 C)   Resp 18   SpO2 96%   Physical Exam Vitals and nursing note reviewed.  Constitutional:      General: She is not in acute distress.    Appearance: She is well-developed. She is not diaphoretic.  HENT:     Head: Normocephalic and atraumatic.  Eyes:     Conjunctiva/sclera: Conjunctivae  normal.  Cardiovascular:     Rate and Rhythm: Normal rate and regular rhythm.  Pulmonary:     Effort: Pulmonary effort is normal. No respiratory distress.  Abdominal:     General: There is no distension.     Palpations: Abdomen is soft.     Tenderness: There is generalized abdominal tenderness. There is no guarding.  Musculoskeletal:        General: No tenderness.     Cervical back: Normal range of motion.  Skin:    General: Skin is warm and dry.     Findings: No erythema or rash.  Neurological:     Mental Status: She is alert and oriented to person, place, and time.     (all labs ordered are listed, but only abnormal results are displayed) Labs Reviewed  CBC WITH DIFFERENTIAL/PLATELET - Abnormal; Notable for  the following components:      Result Value   Hemoglobin 15.5 (*)    All other components within normal limits  COMPREHENSIVE METABOLIC PANEL WITH GFR - Abnormal; Notable for the following components:   Glucose, Bld 105 (*)    Creatinine, Ser 1.02 (*)    All other components within normal limits  URINALYSIS, ROUTINE W REFLEX MICROSCOPIC - Abnormal; Notable for the following components:   Color, Urine COLORLESS (*)    APPearance HAZY (*)    Specific Gravity, Urine <1.005 (*)    Bacteria, UA RARE (*)    All other components within normal limits  LIPASE, BLOOD  LACTIC ACID, PLASMA  PROTIME-INR    EKG: EKG Interpretation Date/Time:  Wednesday December 25 2023 13:53:04 EDT Ventricular Rate:  71 PR Interval:  143 QRS Duration:  93 QT Interval:  399 QTC Calculation: 434 R Axis:   38  Text Interpretation: Sinus rhythm Low voltage, precordial leads No previous ECGs available Confirmed by Dreama Longs ([id number]) on 12/25/2023 2:17:59 PM  Radiology: CT ABDOMEN PELVIS W CONTRAST Result Date: 12/25/2023 CLINICAL DATA:  Abdominal pain, acute, nonlocalized hx liver transplant, severe epigastric RUQ pain EXAM: CT ABDOMEN AND PELVIS WITH CONTRAST TECHNIQUE: Multidetector CT  imaging of the abdomen and pelvis was performed using the standard protocol following bolus administration of intravenous contrast. RADIATION DOSE REDUCTION: This exam was performed according to the departmental dose-optimization program which includes automated exposure control, adjustment of the mA and/or kV according to patient size and/or use of iterative reconstruction technique. CONTRAST:  OMNIPAQUE  IOHEXOL  300 MG/ML  SOLN COMPARISON:  Oct 03, 2023 FINDINGS: Lower chest: No focal airspace consolidation or pleural effusion. Hepatobiliary: Postsurgical changes of prior liver transplant. Scattered subcentimeter hepatic hypodensities, too small to definitively characterize, but likely small cysts or biliary hematomas.Cholecystectomy. Mild dilation of the intrahepatic and extrahepatic bile ducts, likely related to the prior cholecystectomy. The portal veins are patent. Paraesophageal and perigastric varices. Pancreas: No mass or main ductal dilation. No peripancreatic inflammation or fluid collection. Spleen: Borderline splenomegaly.  No mass. Adrenals/Urinary Tract: No adrenal masses. No renal mass. Duplicated left renal collecting system. No nephrolithiasis or hydronephrosis. Decompressed urinary bladder with a well-positioned suprapubic urinary catheter in place. Gas in the bladder lumen is likely related to the catheterization. Stomach/Bowel: The stomach is decompressed without focal abnormality. No small bowel wall thickening or inflammation. No small bowel obstruction.The appendix was not visualized. No right lower quadrant or pericecal inflammatory changes to suggest acute appendicitis. Vascular/Lymphatic: No aortic aneurysm. No intraabdominal or pelvic lymphadenopathy. Reproductive: The uterus and ovaries are within normal limits for patient's age. Intrauterine device in the upper endometrium, well-positioned.No free pelvic fluid. Other: No pneumoperitoneum, ascites, or mesenteric inflammation.  Musculoskeletal: No acute fracture or destructive lesion. Multilevel thoracic osteophytosis. IMPRESSION: No acute intra-abdominal or pelvic abnormality. Electronically Signed   By: Rogelia Myers M.D.   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 who underwent orthotopic liver transplant 08/23/2019 at [facility], prior admissions for nausea and vomiting, had MRCP to evaluate elevated bilirubin and 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  DDx includes SBO, colonic obstruction, abscess, nephrolithiasis, pyelonephritis, adrenal insufficiency,  cholangitis, gastritis.  Labs obtained show normal INR< no pancreatitis, no transaminitis, no leukocytosis.  CT Pending.  Given dilaudid , zofran  for pain.      Final diagnoses:  Generalized abdominal pain    ED Discharge Orders          Ordered    ondansetron  (ZOFRAN -ODT) 4 MG disintegrating tablet  Every 8 hours PRN        12/25/23 1648               Dreama Longs, MD 12/25/23 2221

## 2023-12-25 NOTE — ED Triage Notes (Incomplete)
 Sharp stabbing pain in abdo x 1 week+ n/v  Denies fever Suprapubic cath in place 'feels different from normal pain

## 2023-12-25 NOTE — Discharge Instructions (Addendum)
 Call your GI clinic to discuss your visit for abdominal pain.  Follow up with your pain management clinic as well.

## 2023-12-25 NOTE — ED Provider Notes (Signed)
   38 yo female w/ complicated hx of abdominal pain and surgeries/complications here with worsening abdominal pain.    Ran out of zofran  at home  Physical Exam  BP 106/75   Pulse (!) 51   Temp 98.9 F (37.2 C) (Oral)   Resp 14   SpO2 93%   Physical Exam  Procedures  Procedures  ED Course / MDM    Medical Decision Making Amount and/or Complexity of Data Reviewed Labs: ordered. Radiology: ordered.  Risk Prescription drug management.   Patient reassessed after labs and imaging - feels better, drinking water.  No emergent findings on her workup in the Ed.  This may be gastroparesis or gastritis or another condition.  She feels comfortable going home and will follow up with her pain management clinic and GI team at [facility].  I reviewed her labs and imaging personally - no evidence of SBO, pancreatitis, or other emergent process.  We'll renew her ODT zofran        Cottie Donnice PARAS, MD 12/25/23 1650

## 2023-12-31 ENCOUNTER — Emergency Department (HOSPITAL_COMMUNITY)
Admission: EM | Admit: 2023-12-31 | Discharge: 2023-12-31 | Disposition: A | Discharge status: Home / Self Care | Source: Ambulatory Visit | Attending: Emergency Medicine | Admitting: Emergency Medicine

## 2023-12-31 ENCOUNTER — Encounter (HOSPITAL_COMMUNITY): Payer: Self-pay

## 2023-12-31 ENCOUNTER — Other Ambulatory Visit: Payer: Self-pay

## 2023-12-31 DIAGNOSIS — K625 Hemorrhage of anus and rectum: Secondary | ICD-10-CM | POA: Diagnosis present

## 2023-12-31 DIAGNOSIS — E876 Hypokalemia: Secondary | ICD-10-CM | POA: Diagnosis not present

## 2023-12-31 DIAGNOSIS — D582 Other hemoglobinopathies: Secondary | ICD-10-CM | POA: Diagnosis not present

## 2023-12-31 DIAGNOSIS — K649 Unspecified hemorrhoids: Secondary | ICD-10-CM

## 2023-12-31 DIAGNOSIS — K644 Residual hemorrhoidal skin tags: Secondary | ICD-10-CM | POA: Diagnosis not present

## 2023-12-31 HISTORY — DX: Inflammatory liver disease, unspecified: K75.9

## 2023-12-31 LAB — COMPREHENSIVE METABOLIC PANEL WITH GFR
ALT: 24 U/L (ref 0–44)
AST: 25 U/L (ref 15–41)
Albumin: 4.1 g/dL (ref 3.5–5.0)
Alkaline Phosphatase: 76 U/L (ref 38–126)
Anion gap: 12 (ref 5–15)
BUN: 15 mg/dL (ref 6–20)
CO2: 27 mmol/L (ref 22–32)
Calcium: 9.5 mg/dL (ref 8.9–10.3)
Chloride: 100 mmol/L (ref 98–111)
Creatinine, Ser: 0.95 mg/dL (ref 0.44–1.00)
GFR, Estimated: >60 mL/min (ref >60)
Glucose, Bld: 111 mg/dL — ABNORMAL HIGH (ref 70–99)
Potassium: 3.4 mmol/L — ABNORMAL LOW (ref 3.5–5.1)
Sodium: 139 mmol/L (ref 135–145)
Total Bilirubin: 1.7 mg/dL — ABNORMAL HIGH (ref 0.0–1.2)
Total Protein: 7.4 g/dL (ref 6.5–8.1)

## 2023-12-31 LAB — CBC
HCT: 45.7 % (ref 36.0–46.0)
Hemoglobin: 15.7 g/dL — ABNORMAL HIGH (ref 12.0–15.0)
MCH: 33.3 pg (ref 26.0–34.0)
MCHC: 34.4 g/dL (ref 30.0–36.0)
MCV: 97 fL (ref 80.0–100.0)
Platelets: 162 K/uL (ref 150–400)
RBC: 4.71 MIL/uL (ref 3.87–5.11)
RDW: 11.9 % (ref 11.5–15.5)
WBC: 9 K/uL (ref 4.0–10.5)
nRBC: 0 % (ref 0.0–0.2)

## 2023-12-31 LAB — HCG, SERUM, QUALITATIVE: Preg, Serum: NEGATIVE

## 2023-12-31 MED ORDER — MORPHINE SULFATE (PF) 4 MG/ML IV SOLN
4.0000 mg | Freq: Once | INTRAVENOUS | Status: AC
  Administered 2023-12-31 (×2): 4 mg via INTRAMUSCULAR
  Filled 2023-12-31: qty 1

## 2023-12-31 NOTE — ED Notes (Signed)
 Suprapubic catheter bag emptied by nurse

## 2023-12-31 NOTE — ED Triage Notes (Addendum)
 Patient said she has hemorrhoids. Had a bowel movement today and had bright red blood in the toilet blood. No blood thinners. Generalized abdominal pain. Said she gets constipated and when she had a bowel movement she thinks she could have ripped something in her rectum. History of liver transplant.

## 2023-12-31 NOTE — Discharge Instructions (Addendum)
 Today you were seen for rectal bleeding.  I suspect this is likely due to your external hemorrhoids.  You may use witch hazel or Preparation H to help with your discomfort.  Please follow-up with the colorectal surgeon for further evaluation workup.  Please return to the ED if you have profuse bleeding that does not stop, dizziness, uncontrollable vomiting, shortness of breath, or racing heart rate.  Thank you for letting us  treat you today. After reviewing your labs, I feel you are safe to go home. Please follow up with your PCP in the next several days and provide them with your records from this visit. Return to the Emergency Room if pain becomes severe or symptoms worsen.

## 2023-12-31 NOTE — ED Provider Notes (Signed)
 [place] EMERGENCY DEPARTMENT AT [facility] Provider Note   CSN: [id number] Arrival date & time: 12/31/23  1337     Patient presents with: Rectal Bleeding   Patricia Lutz is a 38 y.o. female.  Presents today for bright red blood per rectum.  Patient states that she has a history of hemorrhoids and had a bowel movement today and had bright red blood in the toilet.  Patient denies blood thinners.  Patient has a history of gastroparesis and tends to be constipated for 4 to 7 days at a time.  Denies dizziness, nausea, vomiting, shortness of breath, chest pain, any other complaints at this time.    Rectal Bleeding      Prior to Admission medications   Medication Sig Start Date End Date Taking? Authorizing Provider  Calcium Citrate-Vitamin D3 315-6.25 MG-MCG TABS Take 2 tablets by mouth in the morning and at bedtime. 09/28/19   [provider]  CRANBERRY PO Take 1 tablet by mouth every morning.    [provider]  cycloSPORINE  modified (NEORAL ) 25 MG capsule Take 50 mg by mouth in the morning and at bedtime. 12/16/20   [provider]  docusate sodium  (COLACE) 100 MG capsule Take 1 capsule (100 mg total) by mouth 2 (two) times daily. 10/07/23   Lue Elsie BROCKS, MD  famotidine  (PEPCID ) 40 MG tablet Take 40 mg by mouth at bedtime.    [provider]  ferrous sulfate 325 (65 FE) MG tablet Take 325 mg by mouth daily. 06/23/23   [provider]  furosemide  (LASIX ) 20 MG tablet Take 40 mg by mouth daily. Patient not taking: Reported on 10/04/2023    [provider]  lactulose  (CHRONULAC ) 10 GM/15ML solution Take 45 mLs (30 g total) by mouth 2 (two) times daily. 10/07/23   Lue Elsie BROCKS, MD  lansoprazole (PREVACID) 15 MG capsule Take 15 mg by mouth 2 (two) times daily before a meal.    [provider]  levothyroxine  (SYNTHROID ) 100 MCG tablet Take 100 mcg by mouth daily before breakfast.    [provider]   magnesium  oxide (MAG-OX) 400 MG tablet Take 2 tablets by mouth 2 (two) times daily. 11/01/20   [provider]  ondansetron  (ZOFRAN -ODT) 4 MG disintegrating tablet Take 1 tablet (4 mg total) by mouth every 8 (eight) hours as needed. Patient taking differently: Take 4 mg by mouth as needed for nausea or vomiting. 03/11/23   Silver Wonda LABOR, PA  ondansetron  (ZOFRAN -ODT) 4 MG disintegrating tablet Take 1 tablet (4 mg total) by mouth every 8 (eight) hours as needed for up to 20 doses for nausea or vomiting. 12/25/23   Cottie Donnice PARAS, MD  oxyCODONE  (OXY IR/ROXICODONE ) 5 MG immediate release tablet Take 1 tablet (5 mg total) by mouth every 6 (six) hours as needed for moderate pain (pain score 4-6). 10/07/23   Lue Elsie BROCKS, MD  polyethylene glycol powder (GLYCOLAX /MIRALAX ) 17 GM/SCOOP powder Take 17 grams by mouth 2 (two) times daily. 10/07/23   Lue Elsie BROCKS, MD  predniSONE  (DELTASONE ) 1 MG tablet Take by mouth. *4 tablets every morning and 3 tablets daily at 3 pm* 06/17/23   [provider]  promethazine  (PHENERGAN ) 50 MG suppository Place 1 suppository (50 mg total) rectally every 6 (six) hours as needed for nausea or vomiting. 10/07/23   Lue Elsie BROCKS, MD  sertraline  (ZOLOFT ) 100 MG tablet Take 100 mg by mouth daily. 06/05/23   [provider]  VEMLIDY  25 MG  TABS Take 1 tablet by mouth daily. 11/30/20   [provider]    Allergies: Ibuprofen    Review of Systems  Gastrointestinal:  Positive for anal bleeding and hematochezia.    Updated Vital Signs BP 112/86 (BP Location: Left Arm)   Pulse 84   Temp 97.8 F (36.6 C) (Oral)   Resp 18   Ht 5' 2 (1.575 m)   Wt 80.7 kg   SpO2 98%   BMI 32.56 kg/m   Physical Exam Vitals and nursing note reviewed. Exam conducted with a chaperone present.  Constitutional:      General: She is not in acute distress.    Appearance: She is well-developed. She is not ill-appearing.  HENT:     Head:  Normocephalic and atraumatic.     Right Ear: External ear normal.     Left Ear: External ear normal.     Nose: Nose normal.     Mouth/Throat:     Mouth: Mucous membranes are moist.     Pharynx: Oropharynx is clear.  Eyes:     Conjunctiva/sclera: Conjunctivae normal.  Cardiovascular:     Rate and Rhythm: Normal rate and regular rhythm.     Pulses: Normal pulses.     Heart sounds: Normal heart sounds. No murmur heard. Pulmonary:     Effort: Pulmonary effort is normal. No respiratory distress.     Breath sounds: Normal breath sounds.  Abdominal:     Palpations: Abdomen is soft.     Tenderness: There is no abdominal tenderness.  Genitourinary:    Rectum: External hemorrhoid present. No anal fissure.     Comments: No brisk bleeding on rectal exam.  Patient does have a hemorrhoid with a fresh scab on, likely source of bleeding previously. Musculoskeletal:        General: No swelling.     Cervical back: Neck supple.  Skin:    General: Skin is warm and dry.     Capillary Refill: Capillary refill takes less than 2 seconds.     Coloration: Skin is not pale.  Neurological:     General: No focal deficit present.     Mental Status: She is alert and oriented to person, place, and time.     Sensory: No sensory deficit.     Motor: No weakness.  Psychiatric:        Mood and Affect: Mood normal.     (all labs ordered are listed, but only abnormal results are displayed) Labs Reviewed  COMPREHENSIVE METABOLIC PANEL WITH GFR - Abnormal; Notable for the following components:      Result Value   Potassium 3.4 (*)    Glucose, Bld 111 (*)    Total Bilirubin 1.7 (*)    All other components within normal limits  CBC - Abnormal; Notable for the following components:   Hemoglobin 15.7 (*)    All other components within normal limits  HCG, SERUM, QUALITATIVE  POC OCCULT BLOOD, ED    EKG: None  Radiology: No results found.   Procedures   Medications Ordered in the ED  morphine  (PF) 4  MG/ML injection 4 mg (has no administration in time range)                                    Medical Decision Making Amount and/or Complexity of Data Reviewed Labs: ordered.   This patient presents to the ED for concern of  rectal bleeding and generalized abdominal pain differential diagnosis includes anal fissure, hemorrhoid, diverticulitis  Additional history obtained   Additional history obtained from Electronic Medical Record External records from outside source obtained and reviewed including Care Everywhere   Lab Tests:  I Ordered, and personally interpreted labs.  The pertinent results include: Mild hypokalemia 3.4, elevated total bili at 1.7, mildly elevated hemoglobin at 15.7, negative pregnancy   Medicines ordered and prescription drug management:  I ordered medication including IM morphine     I have reviewed the patients home medicines and have made adjustments as needed   Problem List / ED Course:  Considered for admission or further workup however patient's vital signs, physical exam, and labs are reassuring.  Patient has no signs or symptoms concerning for acute anemia secondary to blood loss.  Patient to see a colorectal surgeon for evaluation of hemorrhoidectomy.  Patient given return precautions.  I feel patient is safe for discharge at this time.     Final diagnoses:  Hemorrhoids, unspecified hemorrhoid type    ED Discharge Orders     None          Francis Ileana SAILOR, PA-C 12/31/23 1854    Franklyn Sid SAILOR, MD 12/31/23 2251

## 2024-03-25 NOTE — Progress Notes (Signed)
 Physical Therapy Progress Note  Patient Name:  Patricia Lutz Date of Therapy Session: 03/25/24 Time of Therapy Session:  1044 Duration of Session:  18 Minutes Room/Bed: 2118/2118-01      Assessment: Patient requires stand by assist for ambulation. Pt presents at regression from pre-hospitalization baseline, with functional deficits low activity endurance, pain.  The patient will continue to benefit from the skills of a physical therapist to address (P) Pain.  Limiting factors to progression may include pain.    Recommendations for mobility with nursing:  Use BMAT score and associated clinical judgement to determine safe mobility on a daily basis as patient status may be subject to change.  Encourage hallway ambulation using a rollator walker 2-3x/day.  Discharge Recommendations: Is the patient safe to discharge to the recommended disposition? Yes Discharge Recommendations: (P) Home DME Recommendations    Flowsheet Row Most Recent Value  DME Recommendations Mobility Equipment Filed at 03/24/2024 1402  Bathroom Equipment -- * [x]  Filed at 03/24/2024 1402  Mobility aid                                         Walker (P)  Filed at 03/25/2024 1044       Walker type Rollator (P)  Filed at 03/25/2024 1044   Patient would benefit from a rollator walker at home and has a mobility limitation that significantly impairs his/her ability to participate in one or more mobility-related activities of daily living (MRADLs).  Complete details of today's session: At end of session, the patient was left (P) seated at edge of bed. Her status was communicated to the (P) Patient.   EMR reviewed, RN cleared patient for Physical Therapy. Patient feeling better today.  Agreed to ambulate in hallway 300 feet using rollator walker with stand by assist of 1 person.  Once the patient began walking, she reported pain LLE (hip to knee).  Patricia Lutz was advised to ambulate in hallway 2-3x/day using a rollator to  lessen weight bearing on left hip.    03/25/24 1044  Discipline Timestamp  Discipline Timestamp PT  Documentation Type     Documentation Type                                E,R, T   Treatment  Patient Subjective Information  Patient Subjective Information Patient agreeable to therapy  Patient/Family Goals  Patient/Family Goals Go home;Take care of myself;Return to previous lifestyle  Vital Signs  Heart Rate 76  Heart Rate Source Monitor  BP 110/79  MAP (mmHg) 88  Pain Assessment  Pain Assessment %% 0-10  Pain Score %% Seven  Pain Type Acute pain  Pain Loc HIP  Pain Orientation Left  Pain Radiating Towards knee  Pain Frequency Intermittent  Pain Onset With activity  Non-Pharmacological Intervention(s) Rest  Activity At Time Of Vitals Measurement  Activity Rest  Review of Systems Cardiovascular/Pulmonary Function  Any supplemental oxygen? No  Review of Systems - Cognition                         Overall Cognitive Status WNL  Arousal/Alertness Alert  Behavior Cooperative  Communication WNL  Posture/Alignment Screen  Spine Observations No obvious structural deformity  Mobility  Ambulation Yes       Ambulation Assistance Stand by assist  Number of People Required 1  Distance ambulated in feet 300 feet  Ambulation Assistive Device Walker       Walker type Rollator   Number of rest breaks 0  Adult PT Outcomes  Inpatient AM-PAC Performed Basic Mobility Inpatient Short Form - 6 Clicks  AM-PAC 6 Clicks Basic Mobility Inpatient Short Form  Turning from your back to your side while in a flat bed without using bedrails? 4-None  Moving from lying on your back to sitting on the side of a flat bed without using bedrails? 4-None  Moving to and from a bed to a chair (including a wheelchair)? 4-None  Standing up from a chair using using your arms (e.g,. wheelchair, or bedside chair)? 4-None  To walk in hospital room? 4-None  Climbing 3-5 steps with a railing? 3-A Little   AM-PAC Basic Mobility Raw Score 23  AM-PAC Basic Mobility t-Scale Score 50.88  AM-PAC Basic Mobility G-Code Modifier CI  Patient Status At End of Session  Status Communicated to: Patient  Pt Left: seated at edge of bed  Assessment  PT Enhancers Good family support/resources;Intact cognition;Age  PT Barriers Pain;Decreased activity tolerance/medically complex/comorbidities  Impairments/Functional Limitations    Pain  Rehab Potential   Good  Summary of Findings  Patient is progressing slowly toward written goals secondary to  Patient is Progressing Slowly Toward Written Goals Secondary to Multiple refusals;Pain  Plan  Treatment/Interventions Bed mobility;Patient and family education;Endurance training  PT Frequency 3x per week  Discharge Recommendation (DUH/DRH) Home  Mobility aid                                         Patricia Lutz Patricia Lutz type Rollator  Plan(Progress Note) Continue current plan of care       Please see patient education record for PT teaching completed today.   REBECCA CROUCH, PT     *Some images could not be shown.

## 2024-03-26 NOTE — Progress Notes (Signed)
 Hospital Medicine Attending Progress Note  Hospital Day: 11 with principal problem of Severe protein-calorie malnutrition (HHS-HCC).  Subjective:   Interval Events:  - I discussed pt's case with her primary care clinic (Dr. Rosette Mannan) - they confirmed that their clinic would be able to take over outpatient tube feeds if things move in that direction - GI (general and transplant hepatology) teams updated  Interval History: Patricia Lutz is curious when her Dobhoff tube can be placed. She reports feeling relieved that she will be able to get adequate nutrition. She is curious what the next steps will be after Dobhoff placement. She reports no abdominal pain today, continues with severely-reduced PO intake.   Current Vital Signs 24h Vital Sign Ranges  T 36.6 C (97.9 F) (03/26/24 0746) Temp  Avg: 36.5 C (97.7 F)  Min: 36.3 C (97.3 F)  Max: 36.6 C (97.9 F)  BP 102/80 (03/26/24 0746) BP  Min: 102/80  Max: 121/74  HR 71 (03/26/24 0746) Pulse  Avg: 70.3  Min: 64  Max: 76  RR 18 (03/26/24 0746) Resp  Avg: 18  Min: 18  Max: 18  O2sat 97 %   SpO2  Avg: 97 %  Min: 96 %  Max: 98 %       Current Shift Ins/Outs 24h Total Ins/Outs  Time Ins Outs No intake/output data recorded. 11/05 0701 - 11/06 0700 In: 419 [P.O.:419] Out: 2075 [Urine:2075]       Weights   First 74.4 kg (164 lb 0.4 oz) (03/16/24 0427)   Last 72.8 kg (160 lb 8 oz) (03/25/24 9156)     Physical Exam  General Sitting up in bed, smiling, conversant  Eyes Sclera anicteric, pupils equal  HENT OP clear  CV RRR  Resp Breathing comfortably on room air  Abd Soft, non-distended. Suprapubic tube present  Ext WWP  Skin No rashes or lesions  Psych Appropriate mood and affect  Other    Scheduled Medications   calcium citrate-vitamin D3, 2 tablet, Oral, BID LS copper gluconate, 2 mg, Oral, TID cycloSPORINE  modified, 50 mg, Oral, Daily  And cycloSPORINE  modified, 25 mg, Oral, QHS enoxaparin , 40 mg, Subcutaneous,  Daily famotidine , 40 mg, Oral, QHS [Held by provider] FUROsemide , 40 mg, Oral, Daily levothyroxine , 100 mcg, Oral, Daily before breakfast (0630) morphine , 15 mg, Oral, TID multivitamin, 1 tablet, Oral, Daily ondansetron , 4 mg, Intravenous, TID CC pantoprazole , 40 mg, Oral, BID AC polyethylene glycol, 17 g, Oral, BID predniSONE , 4 mg, Oral, Daily  And predniSONE , 2 mg, Oral, Q24H prucalopride, 2 mg, Oral, Daily selenium (L-selenomethionine) CONCENTRATED, 200 mcg, Sublingual, Daily sennosides-docusate, 1 tablet, Oral, BID sertraline , 100 mg, Oral, Daily tenofovir  alafenamide, 25 mg, Oral, Daily thiamine, 100 mg, Oral, Daily vitamin E (dl, acetate), 800 Units, Oral, Daily     Infusions        PRN Meds   acetaminophen , 975 mg, Oral, Q8H PRN ALPRAZolam , 0.5 mg, Oral, BID PRN aluminum & magnesium  hydroxide-simethicone (DUH/DRH), 15 mL, Oral, Q6H PRN dextrose  50% in water, 12.5-25 g, Intravenous, As Directed dextrose  50% in water, 12.5-25 g, Intravenous, As Directed glucagon , 1 mg, Subcutaneous, As Directed glycerin adult, 1 suppository, Rectal, Daily PRN lactulose , 45 mL, Oral, TID PRN lidocaine , 0.5 mL, Subcutaneous, As Directed melatonin, 3 mg, Oral, QHS PRN prochlorperazine, 10 mg, Intravenous, Q8H PRN silver sulfADIAZINE, , Topical, Daily PRN       Recent Lab Trends  Recent Labs  Lab 03/24/24 0839 03/25/24 0827 03/26/24 0819  NA 144 141 139  K  3.8 3.8 3.8  CL 107 105 101  CO2 26 24 23   BUN 6* 7 9  CREATININE 1.0 1.0 1.4*  GLUCOSE 79 70 74  CALCIUM 8.9 9.1 9.6   Recent Labs  Lab 03/24/24 0839 03/25/24 0827 03/26/24 0819  AST 18 18 28   ALT 17 18 19   ALKPHOS 68 77 89  TBILI 0.9 1.7* 2.1*    Recent Labs  Lab 03/24/24 0839 03/25/24 0827 03/26/24 0819  WBC 6.8 8.0 13.3*  HGB 13.7 14.9 16.6*  HCT 40.2 43.5 47.1*  PLT 116* 124* 210   No results for input(s): APTT, INR in the last 168 hours.      Assessment/Plan   Principal Problem:    Severe protein-calorie malnutrition (HHS-HCC) Active Problems:   Abdominal pain, epigastric   Nondiabetic gastroparesis  Patricia Lutz is a 38 y/o F w/ hx of alcohol cirrhosis s/p liver transplant (2021, on pred + cyclosporine ), secondary adrenal insufficiency, urinary retention w/ chronic suprapubic catheter, anxiety, depression, hypothyroidism, gastroparesis, and chronic abdominal pain who presents with months of poor PO intake along with post-prandial pain, nausea, and distension. Undergoing trial of medical management of gastroparesis with minimal improvement in PO intake.  # Severe Protein Calorie Malnutrition # Post-prandial Abdominal Pain and Nausea # Gastroparesis # Acute on Chronic Abdominal Pain Pt w/ hx of chronic abdominal pain, has tried numerous agents (including buprenorphine ), most recently on morphine  ER 30mg  TID, also w/ hx of gastroparesis (abnormal gastric emptying study 11/15/23, with 28% emptying at 120 minutes with normal >40% and 51% at 240 minutes with normal >90%), reports significant post-prandial pain, bloating, and nausea that has caused her to severely reduce her PO intake. Review of pt's weight over time demonstrates pt weighted 192 lbs 05/29/2023, 182 lbs 10/23/2023, 176 lbs 01/04/2024, and 164 lbs this admission. Here, pt evaluated by nutrition, found to have 0-15% of daily nutritional needs via PO intake. GI, transplant hepatology, and transplant psychiatry involved during this admission. Pt was tried on prucalopride, weaned her home morphine  from 30mg  TID to 15mg  TID (to address possible contribution of opioids to reduced gut motility), and tried a dose of methylnaltrexone (other PAMORAS recommended by general GI were not available inpatient and pt has already undergone an outpatient trial of one - naloxegol - without effect). Despite these interventions, pt remained with minimal PO intake, continued evidence of severe malnutrition by nutritionist evaluation, and was found to  have multiple vitamin/nutrient deficiencies (see below). Pt's malnutrition and poor PO intake are likely multifactorial, with contributions from gastroparesis/reduced GI motility, chronic abdominal pain, and food aversion/restriction.  - After confirming that pt's PCP office (I spoke with Dr. Rosette Beth at [facility]) that their clinic can manage outpatient tube feeds, will plan to perform trial of post-pyloric DHT placement and tube feed initiation.  - Appreciate nutrition recommendations on initial tube feed settings. Pt will require frequent lab monitoring given risk of refeeding syndrome in setting of prolonged period of poor PO intake previously - Current motility regimen: prucalopride 2mg  daily (started 10/29), continue bowel regimen, titrate to daily BM - Will avoid further trials of methylnaltrexone given severe discomfort following 450mg  dose on 11/3 - Opioids weaned from morphine  30mg  TID to 15mg  TID on 11/3. No evidence of withdrawal. 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  - Pt started on vitamin  E 800 IU daily supplement. Pt will need repeat vitamin E level checked in 12 weeks (~early February 2026) - Pt started on selenium 200mcg daily with plan for 30 day course followed by repeat level mid-late December 2025 - Pt started on copper gluconate 2mg  TID for 14-day course followed by repeat level ~late November 2025  # History of Liver Transplant # Immunosuppressed Status LFTs within normal limits this admission.  - Continue prednisone  (see below for dose increase this admission) - Continue cyclosporine . Dose reduced this admission given pt supratherapeutic. Goal per hepatology is trough 50-100. Most recent CSA level 11/5: slightly elevated at 115. Per transplant team, will  continue at current dose, continue to check levels every Monday and Wednesday morning - Continue tenofovir  (given donor Hep B cAb+) - Holding home furosemide  given minimal PO intake  # Secondary Adrenal Insufficiency # Hypoglycemia Endocrinology following this admission, feels it unlikely that pt's GI symptoms are related to her hx of secondary AI. While pt with episodes of hypoglycemia this admission, these are more likely related to her minimal PO intake than an endocrinopathy. - Continue trial of slightly increased prednisone  dose (4mg  qAM, 3mg  qPM in place of 4mg  qAM, 1mg  qPM at home) - Appreciate endocrinology recs on if/when to step back to previous home dose  # Depression # Anxiety Pt seen by her transplant psychiatrist (Dr. Rosan) this admission.  - Continue sertraline , PRN alprazolam  (both home medications)  # Hypothyroidism TFts this admisison with low TSH (0.11) and elevated FT4 (1.35). Per endocrinology, levothyroxine  dose was adjusted to 100mcg daily.  - Pt will need repeat TFTs in 6-8 weeks  # GERD - Continue famotidine , PPI  Comorbid Conditions: Nutritional Disorders:   Malnutrition:  Malnutrition identified in prior encounter. Will consider nutrition consult and supplements.     Hypoalbuminemia:    Hypoalbuminemia is associated with increased risk for patients.  We will attempt to treat the underlying condition(s) contributing to this low albumin  state. Electrolyte Disorders:    Hypokalemia:    Will continue to monitor.   Hematologic Disorders:    Thrombocytopenia:   Will continue to monitor and assess for bleeding complications.    VTE Prophylaxis: low molecular weight heparin   Code Status: Full Code  Discharge Planning:  Current Ambulatory Status: ambulating outside of room Anticipated Discharge Destination: Home Anticipated Discharge Needs: none PT DC recommendation: Home (03/25/24 1044) Estimated Discharge Date: 04/02/2024  LENARD CHERON JAYSON EULAS EMILIA, MD  March 26, 2024 12:15 PM   Time spent on this patient encounter: 52 minutes

## 2024-03-27 NOTE — Discharge Summary (Signed)
 [facility] Medicine Discharge Summary  Admit Date: 03/15/2024 Discharge Date: 04/01/2024  Admitting Physician: Dorothe Humphrey Mau, MD Discharge Physician: Margurette Cooley, MD  Primary Care Provider: Crecencio Lacks, NP, Phone [phone]  Discharge Destination: Home with home health  Admission Diagnoses:  Poor appetite [R63.0] Nausea [R11.0]  Discharge Diagnoses:  Principal Problem:   Severe protein-calorie malnutrition (HHS-HCC) Active Problems:   Abdominal pain, epigastric   Nondiabetic gastroparesis Resolved Problems:   * No resolved hospital problems. *  Primary Diagnosis: Admitted for severe-protein calorie malnutrition due to poor PO intake requiring GJ tube placement and tube feed initiation   Changes Made (with rationale):  Started on prucalopride 2mg  daily Pt started on tube feeds, see below for formula and rate  Cyclosporine  dose-reduced from 50 BID to 50mg  AM/25mg  PM Prednisone  increased to 4mg  AM/2mg  PM Pt started on copper, selenium, and vitamin E supplementation given deficiencies Levothyroxine  dose lowered given elevated FT4 and low TSH  To-Do List (incidental findings, follow-up studies, etc.): Pt will need repeat vitamin E level checked in 12 weeks (~early February 2026) Pt will need repeat selenium level to be checked mid-late December 2025 Pt will need repeat copper level to be checked ~late November 2025 Repeat TFTs in 6-8 weeks to ensure current dose of levothyroxine  appropriate  Anticipatory Guidance for Outpatient Care:  As above    Results Pending at Discharge:  None Please see phone numbers at end of this summary for lab contact information.   Follow-up/Care Transition Plan: Sched. appts: Future Appointments  Date Time Provider Department Center  07/09/2024  8:50 AM LAB DRAW, 2B2C [facility]  07/09/2024  9:00 AM Segovia, Hadassah File, MD [facility]  07/10/2024  2:30 PM Dziwis, Delon Garre,  MD [facility] [id number]  11/17/2024 10:40 AM Frutoso Calix, MD DESD [facility]    Follow-up info: No follow-up provider specified.     Allergies/Intolerances:  Allergies  Allergen Reactions  . Ibuprofen Other (See Comments)    Avoid NSAIDS  Not able to take due to liver     New Adverse Drug Events: none  Medications:     Current Discharge Medication List     START taking these medications      Instructions  copper gluconate 2 mg tablet Quantity: 30 tablet Refills: 0  Take 1 tablet (2 mg total) by mouth once daily Last time this was given: 2 mg on April 01, 2024  8:15 AM   enteral syringe, non-sterile Quantity: 50 each Refills: 0  Commonly known as: ENFIT 1 each as directed Pharmacy to dispense 2 ENFit syringes with appropriate bottle adapter(s) per medication based on the following size(s): 1 ml syringes for med(s) Doctor's comments: Please dispense syringe number and size according to dose and therapy   prucalopride 2 mg tablet Quantity: 30 tablet Refills: 0  Commonly known as: MOTEGRITY Take 1 tablet (2 mg total) by mouth once daily Last time this was given: 2 mg on April 01, 2024  8:15 AM   selenium (L-selenomethionine) CONCENTRATED 50 mcg/drop Liqd sublingual liquid Quantity: 6 mL Refills: 0 Start taking on: April 02, 2024  Place 0.2 mLs (200 mcg total) under the tongue once daily for 30 days Last time this was given: 200 mcg on April 01, 2024  8:14 AM   vitamin E 400 unit capsule Quantity: 60 capsule Refills: 0 Start taking on: April 02, 2024  Take 2 capsules (800 Units total) by mouth once daily for 30 days Last time this  was given: 800 Units on April 01, 2024  8:15 AM       These medications have been CHANGED      Instructions  cycloSPORINE  modified 25 mg capsule Refills: 0 What changed: See the new instructions.  Take 2 capsules (50 mg total) by mouth every morning AND 1 capsule (25 mg total) at bedtime. Doctor's comments:  Transplant: 08/23/2019 (Liver) Discharge: 09/09/2019 ICD10: Liver replaced by transplant - Z94.4 Loc: [facility] [place], [place]) For: liver transplant rejection prevention Last time this was given: 50 mg on April 01, 2024  8:14 AM   FUROsemide  20 MG tablet Refills: 0 What changed:  when to take this reasons to take this  Commonly known as: LASIX  Take 2 tablets (40 mg total) by mouth once daily as needed for Edema   levothyroxine  100 MCG tablet Quantity: 30 tablet Refills: 0 What changed:  medication strength how much to take  Commonly known as: SYNTHROID  Take 1 tablet (100 mcg total) by mouth every morning before breakfast (0630) Take on an empty stomach with a glass of water at least 30-60 minutes before breakfast. Last time this was given: 100 mcg on April 01, 2024  6:08 AM   predniSONE  1 MG tablet Refills: 0 What changed: additional instructions  Commonly known as: DELTASONE  4 mg in morning and 2 mg in afternoon with extra for illness per instructions. Last time this was given: 4 mg on April 01, 2024  8:14 AM   Solu-CORTEF  Act-O-Vial (PF) 100 mg/2 mL injection Quantity: 2 mL Refills: 2 Generic drug: hydrocortisone  (PF) What changed:  when to take this reasons to take this  Inject 2ml in case of intractable nausea, vomiting, fainting, dizziness with low blood pressure Last time this was given: Ask your nurse or doctor       CONTINUE taking these medications      Instructions  calcium citrate-vitamin D3 315 mg-6.25 mcg (250 unit) tablet Quantity: 120 tablet Refills: 11  Commonly known as: CITRACAL+D Take 2 tablets by mouth 2 (two) times daily at lunch and dinner Last time this was given: 2 tablets on March 31, 2024  6:01 PM   CRANBERRY ORAL Refills: 0  Take 1 tablet by mouth once daily   DEXCOM G7 RECEIVER Misc Quantity: 1 each Refills: 0  Use 1 each as directed   DEXCOM G7 SENSOR Devi Quantity: 3 each Refills: 11  Use 1 each every  10 (ten) days   famotidine  40 MG tablet Refills: 0  Commonly known as: PEPCID  Take 40 mg by mouth at bedtime Last time this was given: 40 mg on March 31, 2024  8:45 PM   lactulose  10 gram/15 mL oral solution Refills: 0  Commonly known as: ENULOSE  Take 45 mLs by mouth once daily as needed for up to 360 days Last time this was given: 45 mLs on March 29, 2024  5:02 PM   lancets Quantity: 100 each Refills: 6  Use 1 each 4 (four) times daily.   lansoprazole 30 MG DR capsule Refills: 0  Commonly known as: PREVACID Take 30 mg by mouth 2 (two) times daily 4 at 9am and 3 at 3pm   magnesium  oxide 400 mg (241.3 mg magnesium ) tablet Quantity: 180 tablet Refills: 11  Commonly known as: MAG-OX TAKE 2 TABLETS (800 MG TOTAL) BY MOUTH 2 (TWO) TIMES DAILY AT LUNCH AND DINNER Last time this was given: Ask your nurse or doctor   morphine  30 MG ER tablet Refills: 0  Commonly known as: MS CONTIN  30 mg 3 (three) times daily Last time this was given: 15 mg on April 01, 2024  4:31 AM   mupirocin 2 % ointment Refills: 0  Commonly known as: BACTROBAN Apply topically once daily as needed   ondansetron  4 MG disintegrating tablet Quantity: 90 tablet Refills: 0  Commonly known as: ZOFRAN -ODT Take 1 tablet (4 mg total) by mouth every 8 (eight) hours as needed for Nausea or Vomiting Last time this was given: Ask your nurse or doctor   ONETOUCH VERIO FLEX METER Misc Quantity: 1 each Refills: 0 Generic drug: blood-glucose meter  USE AS DIRECTED.   ONETOUCH VERIO TEST STRIPS test strip Quantity: 250 strip Refills: 11 Generic drug: blood glucose diagnostic  4 (four) times daily   oxyCODONE  20 mg immediate release tablet Refills: 0  Commonly known as: OxyIR Take 20 mg by mouth 2 (two) times daily Last time this was given: 10 mg on April 01, 2024  8:27 AM   pen needle, diabetic 31 gauge x 3/16 needle Quantity: 100 each Refills: 3  Use 3 (three) times daily Doctor's  comments: Needle length per patient preference or history.   sennosides-docusate 8.6-50 mg tablet Refills: 0  Commonly known as: SENOKOT-S Take 1 tablet by mouth every morning Last time this was given: 1 tablet on March 31, 2024  7:59 AM   sertraline  100 MG tablet Refills: 0  Commonly known as: ZOLOFT  Take 100 mg by mouth once daily Last time this was given: 100 mg on April 01, 2024  8:13 AM   silver sulfADIAZINE 1 % cream Refills: 0  Commonly known as: SSD Apply topically once daily as needed   syringe with needle, safety 3 mL 25 gauge x 1 Syrg Quantity: 10 each Refills: 1  Use 1 each as directed (for steroid administration)   VEMLIDY  25 mg Quantity: 90 tablet Refills: 2 Generic drug: tenofovir  alafenamide  Take 1 tablet (25 mg total) by mouth once daily Last time this was given: 25 mg on April 01, 2024  8:15 AM   XANAX  0.5 mg tablet Refills: 0 Generic drug: ALPRAZolam   Take 0.5 mg by mouth 2 (two) times daily as needed Last time this was given: 0.5 mg on March 30, 2024  9:39 PM         Brief History of Present Illness:   Ms. Patricia Lutz is a 38 y/o F w/ hx of alcohol cirrhosis s/p liver transplant (2021, on pred + cyclosporine ), secondary adrenal insufficiency, urinary retention w/ chronic suprapubic catheter, anxiety, depression, hypothyroidism, gastroparesis, and chronic abdominal pain of unclear etiology  presenting with months of progressively worsening abdominal pain, N/V, and weight loss.   Patient reports that for nearly a year she has had progressively worsening abdominal pain, nausea and vomiting, and inability to tolerate p.o. intake.  She has an outpatient GI appointment scheduled in February, but presented today because she does not feel she can wait until then given the severity and progressive nature of her symptoms.   She reports that every time she eats any solid food her abdomen becomes significantly distended to the point where she has  developed stretch marks.  The distention does not resolve for multiple days, often requiring her to drink large volume of lactulose  to clear her bowels, with subsequent resolution of abdominal distention.  She also sometimes vomits several hours after eating, reports that vomit always looks like undigested food from what she recently ate.  She tries to be very  diligent about taking her meds, however has been missing at least 1 dose per week due to inability to tolerate p.o. or throwing them up.  She also has severe abdominal pain every time she eats, for which she has been taking opioid pain medication (follows with outpatient pain provider).  Because of the symptoms, she has entirely stopped eating any solid food, last was a piece of flat bread 2 weeks ago.  She is able to keep down water as long as she drinks it slowly, and also has been having some broths and soups.  If she goes prolonged periods without p.o. intake her symptoms feel much better, however this is not sustainable.  She reports that she has lost significant amount of weight over the last year, and the rate of weight loss seems to be accelerating.   She underwent nuclear gastric emptying study which demonstrated delayed solid gastric emptying at 2 and 4 hours, suggestive of gastroparesis.  She underwent EGD and colonoscopy in 12/2023.  EGD with likely hiatal hernia, mildly erythematous gastric mucosa.  Duodenal and gastric biopsies unremarkable, no evidence of celiac or H. pylori.  Colonoscopy with hemorrhoids, single tubular adenoma, and diverticulosis.     She also has had recurrent hypoglycemia over at least the last year, for which she follows with endocrinology.  Has a CGM.  She reports that her blood sugars at baseline are often in the 60s, and if lower than this she develops diaphoresis, malaise, dizziness.  Lowest recent blood sugar was 47.  She has never lost consciousness from hypoglycemia.   She also reports that over the past week  her stool has been pale and colorless, which is new for her.  Also thinks her eyes have been slightly yellow.  She typically does not get heartburn, but she has been having heartburn symptoms in recent days despite taking her medication.   She lives with her husband and children.  She often uses a wheelchair at home because she feels quite debilitated and gets easily fatigued which sometimes causes her to fall (denies LOC or head trauma).  She denies any recent alcohol, tobacco, or recreational substance use.   On presentation to the ED, vitals were T 36.7 C (98.1 F), HR 86, BP 113/85, RR 14, and satting 97 % on RA. Labs notable for Na 140, K 4.1, HCO3 24, BUN 8, sCr 1.1*, glucose 86. CBC notable for WBC 6.4, Hgb 14.8, plts 117*. Bilirubin 1.6, LFTs otherwise normal. Lipase and lactate normal. CT A/P (wet read) with cystic structure in porta hepatis which is stable from prior, mild intrahepatic biliary dilation, thick-walled bladder. Liver US  (wet read) with patent vasculature and normal direction of flow. Abdominal transplant was consulted who advised no acute interventions needed for reported biliary dilation. Pt was admitted to general medicine for further evaluation. _____________________   Hospital Course by Problem:  80F w/ alcoholic cirrhosis s/p transplant 2021, secondary adrenal insufficiency, urinary retention requiring chronic SPT, hypothyroidism, depression, chronic abdominal pain - admitted for severe-protein calorie malnutrition due to poor PO intake requiring GJ tube placement and tube feed initiation    Severe protein calorie malnutrition due to post-prandial abdominal pain and nausea and gastroparesis: Pt w/ hx of chronic abdominal pain requiring chronic opiate therapy. Reports significant post-prandial pain, bloating, and nausea that has caused her to severely reduce her PO intake. Documentation supports weight loss of ~ 30 lbs (from 192 lbs 05/29/2023, to 164 lbs this admission). Also  has hx of gastroparesis (abnormal gastric  emptying study 11/15/23). GI, transplant hepatology, and transplant psychiatry involved during this admission --symptoms failed to improve with reduction of home opiates and trial of methylnaltrexone. Pt requesting to be placed back on home-dose opiates (as she has a pain clinic and wants to work on long-term reduction of doses with them)  --nutrition consulted: since pt was consuming only 0-15% of daily nutritional needs via PO intake, a post-pyloric DHT was placed 11/6 and then GJ tube with IR 11/11 --tube feed regimen: Nocturnal tube feeds via j-port: Nutren1.5 @ goal of 100 mL/hr x 14h. Provides at goal: 1400 mL TF, 2100 kcal, 95 gm protein, 1070 mL free water. Flushes: Minimum 30 mL q4hr to maintain tube patency  --Pt's PCP office (Dr. Rosette Mannan at [facility]) has confirmed that their clinic can manage outpatient tube feeds --started prucalopride 2mg  daily for gut motility and pt encouraged to eat by mouth  --Home opioids weaned in the hospital to try to improve gastroparesis, but after GJ placed an ongoing chronic pain, pt requesting to resume home doses and will work with her pain clinic to slowly reduce in the long-term. Cont home morphine  30mg  TID and oxy IR 20 TID PRN  --started vitamin supplements: vitamin E 800 IU daily (recheck ~early February 2026); selenium 200mcg daily with plan for 30 day course followed by repeat level mid-late December 2025; copper gluconate 2mg  TID for 14-day course followed by repeat level ~late November 2025. Can likely stop these once nutrients repleted.   Alcoholic cirrhosis s/p transplant 2021: still has mild, intermittent thrombocytopenia likely from splenomegaly.  --Continue prednisone  (see below for dose increase) --Reduced home cyclosporin from 50 BID to 50g AM/25 mg PM (trough goal 50-100).  --Continue tenofovir  (given donor Hep B cAb+) --Takes furosemide  PRN at home; hasn't required in the  hospital. OK to resume if/when edema recurs      Secondary Adrenal Insufficiency: Endocrinology following, feels it unlikely that pt's GI symptoms are related to her hx of secondary AI. While pt with episodes of hypoglycemia this admission, these are more likely related to her minimal PO intake than an endocrinopathy. --increased home prednisone  dose from 4mg  qAM, 1mg  qPM to 4mg  qAM, 2mg  qPM    Depression; anxiety: Pt seen by her transplant psychiatrist (Dr. Rosan) this admission.  --Continue home sertraline , PRN alprazolam     Hypothyroidism: TFTs this admission with low TSH (0.11) and elevated FT4 (1.35).  --Per endocrinology, levothyroxine  dose was reduced from 112mcg to 100mcg daily.  --Pt will need repeat TFTs in 6-8 weeks   GERD: --Continue PPI   Suprapubic tube: for chronic urinary retention. Pt performs own monthly SPT exchanges (last done 11/8). No issues during this admission.   Social Drivers of Health with Concerns   Financial Resource Strain: Medium Risk (03/16/2024)   Overall Financial Resource Strain (CARDIA)   . Difficulty of Paying Living Expenses: Somewhat hard  Transportation Needs: Unmet Transportation Needs (03/16/2024)   PRAPARE - Transportation   . Lack of Transportation (Medical): Yes   . Lack of Transportation (Non-Medical): Yes  Physical Activity: Unknown (11/16/2022)   Received from [facility]   Exercise Vital Sign   . On average, how many days per week do you engage in moderate to strenuous exercise (like a brisk walk)?: 0 days  Recent Concern: Physical Activity - Inactive (11/16/2022)   Received from [facility]   Exercise Vital Sign   . Days of Exercise per Week: 0 days   . Minutes of Exercise per Session:  10 min  Social Connections: Socially Isolated (11/16/2022)   Received from [facility]   Social Network   . How would you rate your social network (family, work, friends)?: Little participation, lonely and socially isolated  Depression: Not  at risk (10/22/2023)   Received from [facility]   . Patient Health Questionnaire-2 Score: 2  Recent Concern: Depression - Moderate depression (10/17/2023)   PHQ-9   . PHQ-9 Score: 14    Surgeries and Procedures Performed:  GJ placed 03/31/24  _____________________  Discharge Exam:  BP 91/59 (BP Location: Left upper arm, Patient Position: Lying)   Pulse 65   Temp 36.4 C (97.5 F) (Oral)   Resp 16   Ht 160 cm (5' 3)   Wt 72.1 kg (159 lb)   LMP  (LMP Unknown)   SpO2 96%   BMI 28.17 kg/m    General in bed, smiling, conversant  HENT OP clear.   CV RRR  Resp Breathing comfortably on room air  Abd Soft, non-distended. GJ in place, dressing c/d/i. Suprapubic tube present  Ext [facility]  Skin No rashes or lesions  Psych Appropriate mood and affect    Pertinent Lab Testing: Recent Labs  Lab 03/28/24 0451 03/29/24 0504 03/30/24 0447  NA 139 138 140  K 3.8 4.2 3.7  CL 106 105 105  CO2 24 27 27   BUN 9 11 13   CREATININE 0.9 1.0 1.0  GLUCOSE 88 91 87  CALCIUM 8.7 8.9 8.9   Recent Labs  Lab 03/27/24 0906 03/28/24 0800 03/29/24 0504  AST 22 24 23   ALT 17 16 13   ALKPHOS 72 69 68  TBILI 1.0 1.0 0.9    Recent Labs  Lab 03/26/24 0819 03/27/24 0906 03/28/24 0800  WBC 13.3* 7.1 8.2  HGB 16.6* 14.0 13.7  HCT 47.1* 40.9 39.1  PLT 210 157 153   No results for input(s): APTT, INR in the last 168 hours.   Pertinent Imaging:   IR GJ tube placement Result Date: 03/31/2024 EXAM: Gastrojejunostomy tube placement INDICATION: gastroparesis, GJ tube placement Severe protein-calorie malnutrition (HHS-HCC) [Z56 (ICD-10-CM)] SEDATION / ANESTHESIA / MEDICATION: Level of sedation/anesthesia: Moderate sedation  - the physician who performed the diagnostic/therapeutic procedure provided 45 minutes of continuous face to face moderate sedation services for this procedure with the assistance of an independent trained observer who had no other duties during the procedure.  Sedation /  Anesthesia / Medication Administered:  iopamidoL  (ISOVUE -300) 300 mg iodine /mL (61 %) injection 10 mL - 10 mL predniSONE  (DELTASONE ) tablet 4 mg - Cannot be calculated*  *Administration dose not documented midazolam  (VERSED ) 1 mg/mL injection - 4 mg fentaNYL  (PF) (SUBLIMAZE ) injection - 300 mcg ceFAZolin (ANCEF) 2 g in sodium chloride  0.9 % 100 mL IVPB - 2 g (Totals for administrations occurring from 1248 to 1347 on 03/31/24) FLUOROSCOPY DOSE: Total fluoroscopy time: 8.5 minutes Dose area product: 31.87 mGy-cm2 Air kerma: 103.66 mGy COMPLICATIONS: None immediate PREPROCEDURE: Informed written consent was obtained after a discussion of the risks, benefits, and alternatives to the procedure. A pre-procedure assessment was documented in the medical record. A Time-Out was performed immediately prior to the procedure. TECHNIQUE AND FINDINGS: The patient was positioned supine on the fluoroscopy table. The abdomen was prepped and draped in the usual sterile fashion. After insufflation of the stomach with air, an appropriate access site was chosen using fluoroscopy.  Local anesthesia was administered.  T-fasteners were deployed under fluoroscopic guidance to pexy the stomach to the anterior abdominal  wall.  A small incision was made between the T-fasteners and an introducer needle was inserted, allowing a guidewire to be advanced to the proximal small bowel.  The tract was dilated.  The gastrojejunostomy tube was advanced over the wire. The balloon was inflated to 10 mL and pulled back to the gastrostomy. The external retention disc was cinched down to 3 cm to provide a snug gastropexy. Fluoroscopy was used to confirm positioning of the gastrostomy lumen within the stomach, and the jejunostomy lumen within the proximal small bowel. The patient tolerated the procedure well.    1. Successful placement of a balloon-retained 18 French gastrojejunostomy tube. 2. The patient should remain NPO and maintain gastrostomy port to  low-intermittent wall suction for 3 hours, at which time the gastrostomy port can be used. 3. The jejunostomy port may be used immediately, for feeds and flushes only. PLAN: If T-fasteners are still present after 14 days, nursing staff may remove.  Please contact IR with any questions or for assistance. IAlm Louder, MD, attest that I personally performed the procedure.   X-ray long GI tube placement Result Date: 03/26/2024 GI  [place] Gastric Tube Placement Clinical history and indication: Post-pyloric dobhoff placement in pt with gastroparesis not tolerating medical therapy, K31.84 Gastroparesis Comparison: None Technique: Feeding tube placement was performed under fluoroscopic guidance. Nasally administered 5 mL of 2% Lidocaine  jelly was used for analgesia and lubrication. Orally administered contrast was 20 mL Gastrografin for tube position confirmation. Fluoroscopy time 35 sec DAP 133.85 uGym2 Air Kerma 8.28 mGy Findings: A 8 French CORPAK enteral feeding tube was placed via the right naris with the tip at the duodenal jejunal junction. The tube was bridled into position. Impression: Successful placement of nasoenteric feeding tube. The tube is ready for use. This service was rendered under the overall direction and control of the Attending, who was immediately available via phone/pager or present on site.   Electronically Reviewed by:  Norleen Millin, MD, [facility] Electronically Reviewed on:  03/26/2024 3:02 PM I have reviewed the images and concur with the above findings. Electronically Signed by:  Marin Edu, MD, [facility] Electronically Signed on:  03/26/2024 4:35 PM  CT cirrhosis protocol inc chest w MIPS and abd pel w Result Date: 03/16/2024 Procedure: CT Chest with IV Contrast Procedure: CT Abdomen and Pelvis with IV Contrast Comparison:  CT abdomen pelvis dated 03/15/2024 Indication:  h/o liver transplant, c/f central artery stenosis, Z94.4 Liver transplant status (CMS/HHS-HCC)  Technique:  A cirrhosis protocol CT was performed of the chest, abdomen, and pelvis following the administration of intravenous contrast. Imaging of the abdomen was performed in the arterial phase, the portal venous phase, and in a delayed phase (approximately 3 minutes post contrast) through the abdomen, and in a portal venous phase through the chest and pelvis. Iodinated contrast was used due to the indications for the examination, to improve disease detection and to further define anatomy. Coronal and sagittal reformatted images were generated and reviewed. 3-D maximal intensity projection (MIP) reconstructions of the chest were created and reviewed to potentially increase study sensitivity. Findings: Chest: - Chest wall and Thoracic Inlet: No masses or lymphadenopathy. - Mediastinum and Hila: No masses or lymphadenopathy. - Thoracic Vessels: Normal caliber of the thoracic aorta and main pulmonary artery. - Heart and Pericardium: Normal heart size.  No pericardial effusion. - Lungs and Airways: No suspicious nodules or opacities. - Pleura: No pleural effusions. Abdomen and pelvis: - Liver: Status post liver transplant. Hepatic cyst. No  suspicious hepatic lesions. The portal and hepatic veins are patent. - Biliary and Gallbladder: Similar mild dilation of the common bile duct measuring up to 9 mm, likely on the basis of reservoir effect. Cystic structure in the porta hepatis is unchanged in size measuring 2.4 x 2.3 cm Gallbladder surgically absent. - Spleen: Similar splenomegaly measuring up to 13.8 cm in AP dimension. - Pancreas: Normal in appearance. - Adrenal Glands: Normal in appearance. - Kidneys: Symmetric size and enhancement of the bilateral kidneys. No suspicious renal lesions. No hydronephrosis. - Abdominal and Pelvic Vasculature: No abdominal aortic aneurysm. Conventional hepatic arterial anatomy. Paraesophageal/upper abdominal varices. The celiac, superior mesenteric, and inferior mesenteric arteries  are widely patent without atherosclerotic plaque or stenosis. - Gastrointestinal Tract: No abnormal bowel wall thickening or dilation. Normal appendix. - Peritoneum/Mesentery/Retroperitoneum: No free fluid.  No free intraperitoneal air. - Lymph Nodes: No retroperitoneal or mesenteric lymphadenopathy.  - Bladder: Urinary bladder is decompressed with indwelling suprapubic catheter; trace intraluminal gas is likely related to instrumentation. - Pelvic Organs: Intrauterine device. - Body Wall: Unremarkable. - Musculoskeletal:  No aggressive appearing osseous lesions. Grade 1 anterolisthesis of L4 on L5. Impression: 1.  Mesenteric vasculature is widely patent without atherosclerotic plaque or stenosis. 2.  Unchanged cystic structure in the porta hepatis which is indeterminate but could represent a seroma or remnant cystic duct. Electronically Reviewed by:  Mabel Satterfield, MD, [facility] Electronically Reviewed on:  03/16/2024 3:59 PM I have reviewed the images and concur with the above findings. Electronically Signed by:  Morene Fishman, MD, [facility] Electronically Signed on:  03/16/2024 4:12 PM  CT abdomen pelvis with contrast Result Date: 03/16/2024 CT abdomen and pelvis with IV contrast Comparison:  CT abdomen pelvis 09/19/2023. Indication:  abominal pain, weight loss, pale stools, R63.0 Anorexia. Technique:  CT imaging was performed of the abdomen and pelvis following the administration of intravenous contrast.  Iodinated contrast was used due to the indications for the examination, to improve disease detection and further define anatomy. Coronal and sagittal reformatted images were generated and reviewed. Findings: - Lower Thorax: No suspicious pulmonary abnormalities. No pleural or pericardial effusions. The heart size is normal. - Liver: Postoperative changes from liver transplant.  No suspicious hepatic masses are identified.  The portal and hepatic veins are patent. - Biliary and  Gallbladder: No intrahepatic and extra hepatic biliary duct dilatation. Gallbladder is surgically absent. Stable cystic focus within the porta hepatis measuring 2.1 x 2.3 cm which may represent a postoperative seroma versus cystic duct remnant mucocele.. - Spleen: Normal in appearance.  - Pancreas: Normal in appearance. - Adrenal Glands: Adrenal glands are atrophic. - Kidneys: Symmetric in size. Normal enhancement pattern. No suspicious renal lesions. Bilateral ureteral hyperenhancement which may be seen in the setting of ascending urinary tract infection. No hydronephrosis. - Abdominal and Pelvic Vasculature: No abdominal aortic aneurysm. No significant atherosclerotic plaque. Left upper quadrant splenic renal shunt. Esophageal varices. - Gastrointestinal Tract: Stomach and small bowel is normal in caliber. Moderate volume stool burden within the colon. Normal right lower quadrant appendix - Peritoneum/Mesentery/Retroperitoneum: No free fluid.  No free intraperitoneal air. - Lymph Nodes: No retroperitoneal, mesenteric, pelvic, or inguinal lymphadenopathy.  - Bladder: Suprapubic catheter within the urinary bladder lumen. Concentric urinary bladder wall thickening and mucosal enhancement which may be seen in the setting of acute cystitis.. - Pelvic Organs: Anteverted uterus. Intrauterine device within the upper abdomen segment satisfactory position. No adnexal mass. - Body Wall: Unremarkable. - Musculoskeletal:  No aggressive appearing osseous lesions.  Impression: 1.  Concentric urinary bladder wall thickening with bilateral ureteral mucosal hyperenhancement, findings which are only seen in the setting of acute cystitis ascending urinary tract infection. Correlate with urinalysis. 2.  Stable cystic lesion adjacent to the porta hepatis measuring 2.3 cm which may represent a postoperative seroma versus remnant cystic duct mucocele. The preliminary report (critical or emergent communication) was reviewed prior to this  dictation and there are no critical differences between the preliminary results and the impressions in this final report. Electronically Reviewed by:  Toribio Childs, MD, [facility] Electronically Reviewed on:  03/16/2024 9:49 AM I have reviewed the images and concur with the above findings. Electronically Signed by:  Verneita Pitter, MD, [facility] Electronically Signed on:  03/16/2024 10:57 AM  US  liver dop tx proto US  livr TX dop flow compl US  abd compl Result Date: 03/16/2024 Procedure: US  LIVER DOP TX PROTO US  LIVR TX DOP FLOW COMPL US  ABD COMPL Indication: hx of liver transplant. pain over transplant site. significant weight loss, R63.0 Anorexia Comparison: Ultrasound 09/01/2019 Technique: Gray-scale and color Doppler images of the abdomen. Gray-scale, color Doppler and spectral waveform tracings of the major hepatic blood vessels, splenic artery, splenic vein and inferior vena cava. Findings: Vasculature: *  Blood flow is in the normal direction in the main, right, and left portal veins. Maximum velocity in the main portal vein is 50 cm/s. *  The middle, right, and left hepatic veins are patent with normal flow direction. *  The main, right, and left hepatic arteries are patent. *  Main hepatic artery - mildly blunted waveform with peak systolic velocity of 45 cm/s, RI is 0.5, previously 0.53 *  Left hepatic artery - peak systolic velocity is 24 cm/s, RI is 0.5, previously 0.49 *  Posterior right hepatic artery - peak systolic velocity is 33 cm/s, RI is 0.56, previously 0.42. Blunted spectral waveform morphology. *  Anterior right hepatic artery - peak systolic velocity is 28 cm/s, RI is 0.57, previously 0.48. Blunted spectral waveform morphology. *  Proximal abdominal aorta measures 2.3 cm in AP diameter. Proximal inferior vena cava is patent. Hepatobiliary: *  Liver is of normal echogenicity and echotexture.  No focal hepatic lesions. *  The gallbladder is surgically absent. *  Common bile duct  measures 0.8 cm; no intrahepatic bile duct dilatation identified. Kidneys: *  Right kidney measures 8.4 cm; there is no hydronephrosis. *  Left kidney measures 10.3 cm; there is no hydronephrosis. Other: *  Pancreas is largely obscured by overlying bowel gas, limiting evaluation. *  Spleen is homogenous  in echotexture and measures 12.4 x 6.4 x 5.3 cm. The splenic artery and splenic vein are patent on color Doppler. *  No ascites or pleural effusion.  No perihepatic fluid collections. Impression: Patent transplant hepatic vasculature and flowing in the appropriate direction. 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 If there is ongoing clinical concern, recommend further assessment with CT angiography. A secure Epic chat message was sent to Xiao MD by Dr. Alexa at 10:32 AM 03/16/2024. The preliminary report (critical or emergent communication) was reviewed prior to this dictation and there are no critical differences between the preliminary results and the impressions in this final report. Electronically Reviewed by:  Ronal Repine, DO, [facility] Electronically Reviewed on:  03/16/2024 9:25 AM I have reviewed the images and concur with the above findings. Electronically Signed by:  Beverley Norfolk, MD, [facility] Electronically Signed on:  03/16/2024 10:32 AM  _____________________  Code Status: Full Code Goals of care were not addressed during this admission.   Status on Discharge:  Current activity: Walks occasionally (03/31/24 2033) Current mobility: No limitation (03/31/24 2033)  Activity Recommendation: activity as tolerated  Other Discharge Instructions: Services setup at discharge: [facility] for tube feed management  Tubes/lines at discharge: GJ tube  Diet: Dysphagia Liquid-Only Diet: No solids or purees L0 Thin Liquids Oral Supplements -  Adult All Supplements; Boost Breeze-Assorted; With Meals Oral Supplements - Adult All Supplements; Magic Cup-Assorted; With Meals tube feeding (NUTREN 1.5) liquid  Wound Care Order Instructions     None       _____________________  Time spent on discharge process: 45 minutes    QUINTEN PLOVER, MD [facility]  03/31/2024   Hospital Contact Information:  [facility]) [facility]) [facility])  Pending tests:  Laboratory: [phone] Microbiology: [phone] Pathology: [phone] Radiology: [phone]  General questions: [phone] Pending tests: Laboratory: [phone] Microbiology: [phone] Pathology: [phone] Radiology: [phone]  General questions:  [phone] Pending tests:  Laboratory: [phone] Microbiology: [phone] Pathology: [phone] Radiology: [phone]  General questions:  [phone] Pending tests: Laboratory: [phone] Microbiology: [phone] Pathology: [phone] Radiology: [phone]  General questions:  [phone]

## 2024-03-29 NOTE — Progress Notes (Signed)
 Hospital Medicine Attending Progress Note  Hospital Day: 15 with principal problem of Severe protein-calorie malnutrition (HHS-HCC).  Subjective:  --tearful bec she wants to go home --felling energetic  --still having abd pain that is her long-standing transplant-related pain     Current Vital Signs 24h Vital Sign Ranges  T 36.5 C (97.7 F) (03/29/24 0747) Temp  Avg: 36.5 C (97.7 F)  Min: 36.4 C (97.5 F)  Max: 36.6 C (97.9 F)  BP 96/70 (03/29/24 0747) BP  Min: 96/70  Max: 109/67  HR 74 (03/29/24 0747) Pulse  Avg: 70.3  Min: 66  Max: 74  RR 18 (03/29/24 0747) Resp  Avg: 17.3  Min: 16  Max: 18  O2sat 96 %   SpO2  Avg: 97 %  Min: 96 %  Max: 98 %       Current Shift Ins/Outs 24h Total Ins/Outs  Time Ins Outs No intake/output data recorded. 11/08 0701 - 11/09 0700 In: 890.3  Out: 2075 [Urine:2075]       Weights   First 74.4 kg (164 lb 0.4 oz) (03/16/24 0427)   Last 72.1 kg (159 lb) (03/26/24 1706)     Physical Exam  General in bed, smiling, conversant  HENT OP clear. DHT present in R nare  CV RRR  Resp Breathing comfortably on room air  Abd Soft, non-distended. Suprapubic tube present  Ext WWP  Skin No rashes or lesions  Psych Appropriate mood and affect   Scheduled Medications   calcium citrate-vitamin D3, 2 tablet, Oral, BID LS copper gluconate, 2 mg, Oral, TID cycloSPORINE  modified, 50 mg, Oral, Daily  And cycloSPORINE  modified, 25 mg, Oral, QHS enoxaparin , 40 mg, Subcutaneous, Daily famotidine , 40 mg, Oral, QHS [Held by provider] FUROsemide , 40 mg, Oral, Daily levothyroxine , 100 mcg, Oral, Daily before breakfast (0630) multivitamin, 1 tablet, Oral, Daily ondansetron , 4 mg, Intravenous, TID CC pantoprazole , 40 mg, Oral, BID AC polyethylene glycol, 17 g, Via Tube, Daily predniSONE , 4 mg, Oral, Daily  And predniSONE , 2 mg, Oral, Q24H prucalopride, 2 mg, Oral, Daily selenium (L-selenomethionine) CONCENTRATED, 200 mcg, Sublingual,  Daily sennosides-docusate, 1 tablet, Oral, Daily sertraline , 100 mg, Oral, Daily tenofovir  alafenamide, 25 mg, Oral, Daily thiamine (Vitamin B-1) injection, 200 mg, Intravenous, Daily  Followed by NOREEN ON 03/31/2024] thiamine, 100 mg, Oral, Daily vitamin E (dl, acetate), 800 Units, Oral, Daily     Infusions        PRN Meds   acetaminophen , 975 mg, Oral, Q8H PRN ALPRAZolam , 0.5 mg, Oral, BID PRN aluminum & magnesium  hydroxide-simethicone ([facility]), 15 mL, Oral, Q6H PRN calcium carbonate, 300 mg of elemental, Oral, TID PRN dextrose , 50 mL/hr, Intravenous, As Directed dextrose  50% in water, 12.5-25 g, Intravenous, As Directed dextrose  50% in water, 12.5-25 g, Intravenous, As Directed glucagon , 1 mg, Subcutaneous, As Directed glycerin adult, 1 suppository, Rectal, Daily PRN lactulose , 45 mL, Oral, TID PRN lidocaine , 0.5 mL, Subcutaneous, As Directed melatonin, 3 mg, Oral, QHS PRN morphine , 15 mg, Oral, TID PRN oxyCODONE , 10 mg, Oral, BID PRN prochlorperazine, 10 mg, Intravenous, Q8H PRN silver sulfADIAZINE, , Topical, Daily PRN simethicone, 80 mg, Oral, QID PRN       Recent Lab Trends  Recent Labs  Lab 03/27/24 1628 03/28/24 0451 03/29/24 0504  NA 136 139 138  K 4.5 3.8 4.2  CL 102 106 105  CO2 25 24 27   BUN 10 9 11   CREATININE 1.1* 0.9 1.0  GLUCOSE 139 88 91  CALCIUM 8.7 8.7 8.9   Recent Labs  Lab  03/27/24 0906 03/28/24 0800 03/29/24 0504  AST 22 24 23   ALT 17 16 13   ALKPHOS 72 69 68  TBILI 1.0 1.0 0.9    Recent Labs  Lab 03/26/24 0819 03/27/24 0906 03/28/24 0800  WBC 13.3* 7.1 8.2  HGB 16.6* 14.0 13.7  HCT 47.1* 40.9 39.1  PLT 210 157 153   No results for input(s): APTT, INR in the last 168 hours.      Assessment/Plan   Principal Problem:   Severe protein-calorie malnutrition (HHS-HCC) Active Problems:   Abdominal pain, epigastric   Nondiabetic gastroparesis  62F w/ alcoholic cirrhosis s/p transplant 2021, secondary adrenal  insufficiency, urinary retention requiring chronic SPT, hypothyroidism, depression, chronic abdominal pain - admitted for severe-protein calorie malnutrition. Dobbhoff placed 11/6.  # Severe Protein Calorie Malnutrition # Post-prandial Abdominal Pain and Nausea # Gastroparesis # Acute on Chronic Abdominal Pain # Tube Feed Trial Pt w/ hx of chronic abdominal pain, has tried numerous agents (including buprenorphine ), most recently on morphine  ER 30mg  TID, also w/ hx of gastroparesis (abnormal gastric emptying study 11/15/23, with 28% emptying at 120 minutes with normal >40% and 51% at 240 minutes with normal >90%), reports significant post-prandial pain, bloating, and nausea that has caused her to severely reduce her PO intake. Review of pt's weight over time demonstrates pt weighted 192 lbs 05/29/2023, 182 lbs 10/23/2023, 176 lbs 01/04/2024, and 164 lbs this admission. Here, pt evaluated by nutrition, found to have 0-15% of daily nutritional needs via PO intake. GI, transplant hepatology, and transplant psychiatry involved during this admission. Pt was tried on prucalopride, weaned her home morphine  from 30mg  TID to 15mg  TID (to address possible contribution of opioids to reduced gut motility), and tried a dose of methylnaltrexone (other PAMORAS recommended by general GI were not available inpatient and pt has already undergone an outpatient trial of one - naloxegol - without effect). Despite these interventions, pt remained with minimal PO intake, continued evidence of severe malnutrition by nutritionist evaluation, and was found to have multiple vitamin/nutrient deficiencies (see below). Pt's malnutrition and poor PO intake are likely multifactorial, with contributions from gastroparesis/reduced GI motility, chronic abdominal pain, and food aversion/restriction.  - Post-pyloric DHT placed 11/6, pt undergoing tube feed trial. Per nutrition recs, using Nutren 1.5, increasing rate by 8ml/hr every 12 hours with goal  rate of 60 ml/hr.  - no evidence of refeeding syndrome - pt tolerating tube feeds at goal rate, will discuss J-tube with transplant hepatology team 10/10 - If pt to be discharged on tube feeds, pt's PCP office ([facility] at [facility]) has confirmed that their clinic can manage outpatient tube feeds - Current motility regimen: prucalopride 2mg  daily (started 10/29), continue bowel regimen, titrate to daily BM - Will avoid further trials of methylnaltrexone given severe discomfort following 450mg  dose on 11/3 - Opioids weaned from morphine  30mg  TID to 15mg  TID PRN and oxy IR 20 TID PRN to 10 BID PRN.   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  - Pt started on vitamin E 800 IU daily supplement. Pt will need repeat vitamin E level checked in 12 weeks (~early February 2026) - Pt started on selenium 200mcg daily with plan for 30 day course followed by repeat level mid-late December 2025 - Pt started on copper gluconate 2mg  TID for 14-day course followed by repeat level ~late November 2025  Alcoholic cirrhosis s/p transplant 2021: still has mild, intermittent thrombocytopenia likely from  splenomegaly.  - Continue prednisone  (see below for dose increase this admission) - Continue cyclosporine . Dose reduced this admission given pt supratherapeutic. Goal per hepatology is trough 50-100. Most recent CSA level 11/5: slightly elevated at 115. Per transplant team, will continue at current dose, continue to check levels every Monday and Wednesday morning - Continue tenofovir  (given donor Hep B cAb+) - Holding home furosemide  given minimal PO intake. Now that on TFs, will closely monitor for volume accumulation and restart furosemide  as needed  # Secondary Adrenal Insufficiency # Hypoglycemia Endocrinology following this admission, feels it unlikely that pt's GI symptoms are related to her hx of secondary AI. While pt  with episodes of hypoglycemia this admission, these are more likely related to her minimal PO intake than an endocrinopathy. - Continue trial of slightly increased prednisone  dose (4mg  qAM, 3mg  qPM in place of 4mg  qAM, 1mg  qPM at home) - endocrinology following for if/when to step back to previous home dose  # Depression # Anxiety Pt seen by her transplant psychiatrist (Dr. Rosan) this admission.  - Continue sertraline , PRN alprazolam  (both home medications)  # Hypothyroidism TFTs this admission with low TSH (0.11) and elevated FT4 (1.35). Per endocrinology, levothyroxine  dose was adjusted to 100mcg daily.  - Pt will need repeat TFTs in 6-8 weeks  # GERD - Continue famotidine , PPI  # Suprapubic Tube Present Pt performs own monthly SPT exchanges (last done 11/8). No issues during this admission.   VTE Prophylaxis:  VTE Prophylaxis  + Anticoagulant Ordered  enoxaparin , 40 mg, Subcutaneous, Daily, 40 mg at 03/29/24 0801       Code Status: Full Code  Discharge Planning:  Current Ambulatory Status: ambulating outside of room Anticipated Discharge Destination: Home Anticipated Discharge Needs: none PT DC recommendation: Home (03/25/24 1044) Estimated Discharge Date: 04/01/2024  NAPOLEON OLIVIA QUINTEN MARGURETTE, MD  March 29, 2024 1:52 PM

## 2024-03-30 NOTE — H&P (Signed)
 [facility] Pre-Procedure H&P  Demographics:Patricia Lutz is a 38 y.o. female with PMH significant for gastroparesis, liver transplant for whom IR has been consulted for GJ tube placement.    Per discussion with primary team, patient has had gastroparesis for years. Admitted currently for initiation of NJ tube feeds, which have been well tolerated. Now planning for GJ tube placement.  Procedure requested: GJ tube placement  Indication: gastroparesis  Plan for moderate sedation: Yes. Prior anesthesia or moderate sedation? Yes, with no complications. DNR: No. Suspended for procedure? Not applicable NPO: Yes - maintain NPO status Venous Access: Peripheral IV Precautions: None  Current Facility-Administered Medications  Medication Dose Route Frequency Provider Last Rate Last Admin  . acetaminophen  (TYLENOL ) tablet 975 mg  975 mg Oral Q8H PRN Miller, Kaia Christine, MD   975 mg at 03/29/24 9373  . ALPRAZolam  (XANAX ) tablet 0.5 mg  0.5 mg Oral BID PRN Miller, Kaia Christine, MD   0.5 mg at 03/29/24 2054  . aluminum & magnesium  hydroxide-simethicone (DUH/DRH) (MYLANTA MAXIMUM) 400-400-40 mg/5 mL suspension 15 mL  15 mL Oral Q6H PRN Hofacker, Jayson Scotts, MD   15 mL at 03/23/24 1117  . calcium carbonate (TUMS E-X) chewable tablet 750 mg of salt  300 mg of elemental Oral TID PRN Chandiramani, Anisha, MD   750 mg of salt at 03/28/24 1237  . calcium citrate-vitamin D3 (CITRACAL+D) 315 mg-6.25 mcg (250 unit) tablet 2 tablet  2 tablet Oral BID LS Cleotilde Bayard Reusing, MD   2 tablet at 03/29/24 1702  . copper gluconate tablet 2 mg  2 mg Oral TID Hofacker, Jayson Scotts, MD   2 mg at 03/30/24 0844  . cycloSPORINE  modified capsule  50 mg Oral Daily Xiao, Daphne, MD   50 mg at 03/30/24 9157   And  . cycloSPORINE  modified capsule  25 mg Oral QHS Xiao, Daphne, MD   25 mg at 03/29/24 2048  . dextrose  10 % infusion  50 mL/hr Intravenous As Directed Hofacker, Jayson Scotts, MD      .  dextrose  50% in water solution 12.5-25 g  12.5-25 g Intravenous As Directed Cleotilde Bayard Reusing, MD      . dextrose  50% in water solution 12.5-25 g  12.5-25 g Intravenous As Directed Cleotilde Bayard Reusing, MD   25 g at 03/25/24 2101  . [Held by provider] enoxaparin  (LOVENOX ) 40 mg/0.4 mL inj syringe 40 mg  40 mg Subcutaneous Daily Miller, Kaia Christine, MD   40 mg at 03/30/24 0840  . famotidine  (PEPCID ) tablet 40 mg  40 mg Oral QHS Miller, Kaia Christine, MD   40 mg at 03/29/24 2048  . [Held by provider] FUROsemide  (LASIX ) tablet 40 mg  40 mg Oral Daily Miller, Kaia Christine, MD   40 mg at 03/17/24 0813  . glucagon  (GLUCAGEN) injection 1 mg  1 mg Subcutaneous As Directed Miller, Kaia Christine, MD      . glycerin adult suppository 1 suppository  1 suppository Rectal Daily PRN Xiao, Daphne, MD      . lactulose  (ENULOSE ) 10 gram/15 mL solution 45 mL  45 mL Oral TID PRN Miller, Kaia Christine, MD   45 mL at 03/29/24 1702  . levothyroxine  (SYNTHROID ) tablet 100 mcg  100 mcg Oral Daily before breakfast (0630) Mozingo, Tinnie Deems, MD   100 mcg at 03/30/24 0600  . lidocaine  (XYLOCAINE ) 1 % injection 0.5 mL  0.5 mL Subcutaneous As Directed Cleotilde Bayard Reusing, MD      . melatonin tablet 3  mg  3 mg Oral QHS PRN Miller, Kaia Christine, MD      . morphine  (MS CONTIN ) ER tablet 15 mg  15 mg Oral TID PRN Augustin Dorothe Mccoy, MD   15 mg at 03/30/24 0953  . multivitamin 400 mcg tablet 1 tablet  1 tablet Oral Daily Xiao, Daphne, MD   1 tablet at 03/30/24 0841  . ondansetron  (PF) (ZOFRAN ) injection 4 mg  4 mg Intravenous TID CC Xiao, Daphne, MD   4 mg at 03/30/24 0841  . oxyCODONE  (ROXICODONE ) immediate release tablet 10 mg  10 mg Oral BID PRN Chandiramani, Anisha, MD   10 mg at 03/29/24 2330  . pantoprazole  (PROTONIX ) EC tablet 40 mg  40 mg Oral BID AC Miller, Kaia Christine, MD   40 mg at 03/30/24 0841  . polyethylene glycol (MIRALAX ) packet 17 g  17 g Via Tube Daily Hofacker, Jayson Scotts, MD       . predniSONE  (DELTASONE ) tablet 4 mg  4 mg Oral Daily Coy Shelba Dines, DO   4 mg at 03/30/24 9156   And  . predniSONE  (DELTASONE ) tablet 2 mg  2 mg Oral Q24H Coy Shelba Dines, DO   2 mg at 03/29/24 1303  . prochlorperazine (COMPAZINE) injection 10 mg  10 mg Intravenous Q8H PRN Xiao, Daphne, MD   10 mg at 03/29/24 2055  . prucalopride (MOTEGRITY) tablet 2 mg  2 mg Oral Daily Neysa Darice Craven, MD   2 mg at 03/30/24 1008  . selenium (L-selenomethionine) CONCENTRATED 50 mcg/drop sublingual liquid 200 mcg  200 mcg Sublingual Daily Hofacker, Jayson Scotts, MD   200 mcg at 03/30/24 0841  . sennosides-docusate (SENOKOT-S) 8.6-50 mg tablet 1 tablet  1 tablet Oral Daily Hofacker, Jayson Scotts, MD   1 tablet at 03/30/24 0841  . sertraline  (ZOLOFT ) tablet 100 mg  100 mg Oral Daily Cleotilde Bayard Reusing, MD   100 mg at 03/30/24 0841  . silver sulfADIAZINE (SSD) 1 % cream   Topical Daily PRN Miller, Kaia Christine, MD      . simethicone (MYLICON) chewable tablet 80 mg  80 mg Oral QID PRN Hofacker, Samuel Augustus, MD   80 mg at 03/27/24 1817  . tenofovir  alafenamide (VEMLIDY ) 25 mg  25 mg Oral Daily Miller, Kaia Christine, MD   25 mg at 03/30/24 0843  . [START ON 03/31/2024] thiamine (Vitamin B-1) tablet 100 mg  100 mg Oral Daily Hofacker, Jayson Scotts, MD      . tube feeding (NUTREN 1.5) liquid   Via Tube Continuous Patricia Jayson Scotts, MD 60 mL/hr at 03/30/24 0349 60 mL/hr at 03/30/24 0349  . vitamin E capsule 800 Units  800 Units Oral Daily Hofacker, Jayson Scotts, MD   800 Units at 03/30/24 [phone]    Patient taking benzodiazepines?: No. Patient taking opioids? No Other notable medications? No   Past Medical History:  Diagnosis Date  . Alcohol problem drinking   . Anemia 2018  . Anesthesia complication    had epidural anesthesia during C&S-half of my body wasn't numbed  . Anxiety disorder, unspecified   . At high risk for falls    patient endorses falling 01/03/24   . Awareness under anesthesia    Patient woke during ERCP and during endoscopy  . Bleeding disorder () 2020   Thought to be liver-related, no probkems since transplant  . Chronic kidney disease 2021  . Cirrhosis (CMS/HHS-HCC)   . Depression (emotion)   . Diabetes mellitus without complication (CMS/HHS-HCC) 2021  Gestational  in 09 then after transplant  . 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 Endometriosis of uterus 2004  . GERD (gastroesophageal reflux disease)   . Hashimoto's disease   . History of abnormal cervical Pap smear 2009  . History of blood transfusion 2020  . History of DVT (deep vein thrombosis) 03/2023   treated with blood thinners, resolved, no recurrence  . Hypothyroidism 2000  . IBS (irritable bowel syndrome)   . Nausea without vomiting 11/24/2015  . Seizures (CMS/HHS-HCC)    age 62, thought to be medication-induced, no recurrence  . Suprapubic catheter (CMS/HHS-HCC)   . Wears glasses     Patient Active Problem List  Diagnosis  . Alcoholic cirrhosis (CMS/HHS-HCC)  . Anemia, iron deficiency  . Severe protein-calorie malnutrition (HHS-HCC)  . Acquired hypothyroidism  . Abnormal uterine bleeding  . Coagulopathy (HHS-HCC)  . Panic disorder  . Immunosuppressed status (HHS-HCC)  . Prophylactic antibiotic  . Liver transplant recipient (CMS/HHS-HCC)  . 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 Risk for steroid-induced hyperglycemia  . At risk for delirium  . Hyponatremia  . AKI (acute kidney injury)  . Drug-induced diabetes mellitus (HHS-HCC)  . Hypervolemia  . Nausea without vomiting  . Unspecified abdominal pain  . Gas pain  . Gastroesophageal reflux disease without esophagitis  . Alteration in neurological status in adult  . Schmorl's nodes of the thoracic region  . Fatigue  . Adrenal insufficiency (HHS-HCC)  . Biliary stricture of transplanted liver  (CMS/HHS-HCC)  . Post-ERCP acute  pancreatitis (HHS-HCC)  . Irritable bowel syndrome with constipation and diarrhea  . History of Helicobacter pylori infection  . Biliary stent migration  . Hypomagnesemia  . Gross hematuria  . Nightmare disorder  . History of ulcer disease  . Chronic prescription benzodiazepine use  . Hypokalemia  . Other chronic postprocedural pain  . Abdominal pain, epigastric  . Intractable abdominal pain  . Ovarian cyst, bilateral  . History of small intestinal bacterial overgrowth (SIBO)  . Vomiting  . Abnormal levels of other serum enzymes  . Abnormal menstrual cycle  . Ascites  . Bladder wall thickening  . Hepatic fibrosis, unspecified  . Depression  . Elevated LFTs  . Functional dyspepsia  . History of gallstones  . Long term (current) use of insulin  (CMS/HHS-HCC)  . Mania (CMS/HHS-HCC)  . Menorrhagia  . Poor appetite  . Pre-liver transplant, listed  . Primary insomnia  . Streptococcal sore throat  . Thrombocytopenia ()  . Urinary retention  . Recent urinary tract infection  . Chronic indwelling Foley catheter  . Recurrent UTI  . Hypothyroidism, adult  . Abdominal pain  . Preoperative evaluation of a medical condition to rule out surgical contraindications (TAR required)  . Upper abdominal pain  . Adrenal insufficiency (Addison's disease) (CMS/HHS-HCC)  . GAD (generalized anxiety disorder)  . Nondiabetic gastroparesis    Allergic to iodinated contrast? No  Allergies  Allergen Reactions  . Ibuprofen Other (See Comments)    Avoid NSAIDS  Not able to take due to liver   Review of Systems is positive for: None  VITALS: Temp:  [36.7 C (98.1 F)] 36.7 C (98.1 F) Heart Rate:  [76-82] 76 Resp:  [15-18] 18 BP: (90-115)/(59-75) 93/68  PHYSICAL EXAM:  Mental Status Examination: Normal Airway: II (hard and soft palate, upper portion of tonsils and uvula visible) Neck: Full range of motion Respiratory: Clear to auscultation CV: RRR, no M/R/G Site marked: Not applicable  Recent Labs  Lab 03/27/24 0906 03/28/24 0800  WBC 7.1 8.2  HGB 14.0 13.7  HCT 40.9 39.1  PLT 157 153    No results for input(s): INR, PT, APTT in the last 1440 hours.  Recent Labs  Lab 03/28/24 0451 03/29/24 0504 03/30/24 0447  NA 139 138 140  K 3.8 4.2 3.7  CL 106 105 105  CO2 24 27 27   BUN 9 11 13   CREATININE 0.9 1.0 1.0  GLUCOSE 88 91 87  CALCIUM 8.7 8.9 8.9  PHOS 3.8 2.4 3.7  MG 2.0 1.8  --    Recent Labs  Lab 03/28/24 0800 03/29/24 0504  ALB 3.4* 3.4*  TBILI 1.0 0.9  ALKPHOS 69 68  AST 24 23  ALT 16 13     Prior Imaging: Prior imaging history reviewed: CT AP cirrhosis protocol 03/16/24  Implants to be placed: Availability of gastrojejunostomy tube confirmed  ASSESSMENT & PLAN 1. Procedure: GJ tube placement indicated. Site has not been marked because it's not applicable to this case. This will be completed 03/31/24. This procedure has been fully reviewed with the patient, and written informed consent has been obtained. 2. Sedation: ASA Grade Assessment: 3 - A patient with severe systemic disease. Moderate sedation is appropriate for this patient at this time.  3. Antibiotics: The patient will require Ancef (cefazolin) to be administered in VIR immediately prior to the procedure.  4. Blood product transfusion: Transfusion not anticipated. 5. Post-procedure care: Inpatient to return to his/her room. 6. Discussed with VIR attending: Dr. Alm Vicci Sal[facility] PGY-4

## 2024-03-30 NOTE — Progress Notes (Signed)
 Nutrition Assessment  Reason for visit: Follow-up   Admission Diagnosis / Current Problem: 38F w/ alcoholic cirrhosis s/p transplant 2021, secondary adrenal insufficiency, urinary retention requiring chronic SPT, hypothyroidism, depression, chronic abdominal pain - admitted for severe-protein calorie malnutrition. Dobbhoff placed 11/6.    Past Medical History:  Diagnosis Date  . Alcohol problem drinking   . Anemia 2018  . Anesthesia complication    had epidural anesthesia during C&S-half of my body wasn't numbed  . Anxiety disorder, unspecified   . At high risk for falls    patient endorses falling 01/03/24  . Awareness under anesthesia    Patient woke during ERCP and during endoscopy  . Bleeding disorder () 2020   Thought to be liver-related, no probkems since transplant  . Chronic kidney disease 2021  . Cirrhosis (CMS/HHS-HCC)   . Depression (emotion)   . Diabetes mellitus without complication (CMS/HHS-HCC) 2021   Gestational  in 09 then after transplant  . 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 Endometriosis of uterus 2004  . GERD (gastroesophageal reflux disease)   . Hashimoto's disease   . History of abnormal cervical Pap smear 2009  . History of blood transfusion 2020  . History of DVT (deep vein thrombosis) 03/2023   treated with blood thinners, resolved, no recurrence  . Hypothyroidism 2000  . IBS (irritable bowel syndrome)   . Nausea without vomiting 11/24/2015  . Seizures (CMS/HHS-HCC)    age 44, thought to be medication-induced, no recurrence  . Suprapubic catheter (CMS/HHS-HCC)   . Wears glasses    Nutrition Assessment: Anthropometrics  Admit Height: 160 cm (5' 3) (03/16/24 0427)  Admit Weight: 74.4 kg (164 lb 0.4 oz) (03/16/24 0427) *standing Admit BMI: 29.1 kg/m  Usual body weight:   ~190 lbs % Weight change: 12.1% weight loss x 4 months, clinically significant  Weights this admission:  net -2.3 kg during  admission 11/6 72.1 kg (Standing)  Nutrition History:   Food Allergies: None per chart  Current Nutrition: Diet Order:  Dysphagia Liquid-Only Diet: No solids or purees L0 Thin Liquids +  Boost Breeze-Assorted; With Meals + Magic Cup-Assorted; With Meals  tube feeding (NUTREN 1.5) liquid via NDT at 60 mL/hr x 24h  Provides at goal: 1440 mL TF, 2160 kcal, 98 gm protein, 1106 mL free water   Intake: Reached goal 11/9 and running at goal rate this morning, pt reports tolerating well, does continue to have nausea and abdominal pain but these are at baseline/ associated with liver. Regarding PO intake, drinking water, clears, and had some ice cream x 2 days                 Nutrition-Focused Findings:  Physical Assessment: no muscle or fat losses observed (03/30/24 1300)      GI Function: Nausea Last BM: 03/29/24 Skin: Intact Edema: Trace generalized, +1 BLE   Biochemical Data and Medication (nutritionally relevant): Labs:   Recent Labs  Lab 03/28/24 0451 03/29/24 0504 03/30/24 0447  NA 139 138 140  K 3.8 4.2 3.7  CL 106 105 105  CO2 24 27 27   BUN 9 11 13   CREATININE 0.9 1.0 1.0  GLUCOSE 88 91 87  CALCIUM 8.7 8.9 8.9  PHOS 3.8 2.4 3.7   Recent Labs    03/29/24 1813 03/29/24 2105 03/30/24 0127 03/30/24 0602 03/30/24 1247  POCGLU 102 117 99 100 135   Medications:  Citrical, pepcid , MVI, Zofran  TID, protonix , synthroid , Selenium 200 mcg, thiamine, Prednisone , Vitamin E 800  IU, senokot, copper gluconate . tube feeding 60 mL/hr (03/30/24 0349)   Estimated Nutritional Needs:  Calculation Weight Used: Admission weight Energy: 1860 - 2232 kcal (25 - 30 kcal/kg) Protein:  75-90 gm (1-1.2 gm/kg) Fluid:  Per team  Nutrition Evaluation: Pt due for nutrition follow up. Net -2.3 kg during admission. Pt feels as though she is tolerating TF well at goal rate. Nausea and abdominal pain continue though reports these are at baseline and chronic. Tolerating clear liquids and staying  hydrated. BG and electrolytes WNL. Amenable to work toward transition to nocturnal cycle. Further recommendations below:  Nutrition Diagnosis: -NI-1.4 Inadequate energy intake related to nausuea, bloating, abdominal discomfort, emesis as evidenced by unintentional weight loss - resolving with TF  Malnutrition Assessment: Diagnosis: severe protein-calorie malnutrition (03/17/24 1700) in the context of chronic illness Criteria: >10% unintentional weight loss in 6 months, energy intake meeting < or equal to 75% of estimated requirement for > or equal to 1 month Intervention: Recommend enteral nutrition support (03/17/24 1700)  Nutrition Recommendations:  Recommend Clear Liquids or well-tolerated Full Liquids Only Nocturnal Tube feeds via NDT: Nutren1.5 @ goal of 100 mL/hr x 14h Increase to 75 mL/hr and increase by 15 mL q8h to goal rate -100 mL/hr x 14h Provides at goal: 1400 mL TF, 2100 kcal, 95 gm protein, 1070 mL free water  Flushes: Minimum 30 mL q4hr to maintain tube patency  Monitor electrolytes and replete PRN - at risk for refeeding Continue daily thiamine and multivitamin  Continue anti emetics for nausea control Vitamin E 800 IU or 15 IU/kg (daily) x 12 weeks then recheck lab 200 mcg Selenium oral drops x 30 days then recheck lab Copper Gluconate 2 mg TID x 2 weeks then recheck lab Weigh 1-2x weekly to trend (standing)  Assessed at: 1212  Time Spent: 45 Minute(s)  Patricia JULIETTE TOWNSEND, MS, RD, LDN

## 2024-03-31 NOTE — Progress Notes (Addendum)
 Case Management Discharge Planning Note  Barriers identified?  None  EDD: 04/01/2024  Preferred DC location: Home  Medical Readiness for Discharge:  Reassessment Documentation Goals prior to discharge: Patient will be medically stable to discharge on 04/01/2024 Mobility: ambulates independently Anticipated care needs at discharge include: Tube Feeds/TPN Anticipated care needs comment: Patient to go to IR today for G-J tube. [facility] following. Preferred Discharge Location: Home  Team recommendations and collaboration for discharge planning Home Health (New) (Home enteral care through [facility])  Pt / Patient representative conversation and collaboration for discharge planning PPatient admitted to Inpatient status on 03/16/2024 for abdominal pain, nausea and vomiting and weight loss. She has a medical history significant for alcohol cirrhosis s/p liver transplant in 2021. Patient also has a history of urinary retention s/p chronic suprapubic catheter, anxiety, depression, hypothyroidism   Patient to dc with enteral support provided by [facility]. IR to place new G-J today. Camie Hamilton following with [facility]. CM will continue to follow.  Patient and/or their representative have participated in and are in agreement with discharge plan.   CM Next Steps: [  ] Patient to dc on 11/12 [  ] [facility] following for education and support   Coordinated Resources:  Enteral support    Olivia Bue  [phone]

## 2024-04-01 NOTE — Progress Notes (Signed)
 Case Manager Discharge Summary / Closing Note  Expected Discharge Date & Time: 04/01/2024 at 11 am to 2 pm  Discharge Plan:  The patient has been involved in the development of this plan and is in agreement.    Post-Acute Services Coordinated: Dialysis/Infusion - Discharged on 04/01/2024 Admission date: 03/15/2024 - Discharge disposition: Home or Self Care    Service Provider Services Address Phone Fax Patient Preferred   [facility] ([facility]  Infusion and IV Therapy [address] [place], [place] [address] [phone] --         Funding for Discharge Medications: Drug assistance not needed    Transportation: Arrangements: patient arranged Discharge Transportation: private vehicle  Final ADT:  Final ADT Discharge Disposition: Home Based  Home Based: Home Infusion ONLY - covers Line Care, Teaching and Labs. (No Organized HH) ([facility] following for tube feeding support and formula)                 Second IMM Received: Not indicated (04/01/24 1825)  Final Summary: PPatient admitted to Inpatient status on 03/16/2024 for abdominal pain, nausea and vomiting and weight loss. She has a medical history significant for alcohol cirrhosis s/p liver transplant in 2021. Patient also has a history of urinary retention s/p chronic suprapubic catheter, anxiety, depression, hypothyroidism    Patient to dc with enteral support provided by [facility]. IR has placed new G-J . Camie Hamilton following with [facility]. Two bags of formula sent home with Patient via RN. Husband to transport home.   MICHELE MAACK [phone]

## 2024-04-01 NOTE — Progress Notes (Signed)
 Discharge Note:    Discharge instructions discussed with the patient and all questions were answered. The pt was provided with a copy of all discharge instructions. The pt was educated on where to pick up prescriptions.   Pt was taught how to manage j tube at home.   The patients PIV was removed with no complications.

## 2024-04-09 ENCOUNTER — Other Ambulatory Visit: Payer: Self-pay

## 2024-04-09 ENCOUNTER — Emergency Department (HOSPITAL_BASED_OUTPATIENT_CLINIC_OR_DEPARTMENT_OTHER)
Admission: EM | Admit: 2024-04-09 | Discharge: 2024-04-09 | Disposition: A | Discharge status: Home / Self Care | Attending: Emergency Medicine | Admitting: Emergency Medicine

## 2024-04-09 ENCOUNTER — Emergency Department (HOSPITAL_BASED_OUTPATIENT_CLINIC_OR_DEPARTMENT_OTHER)

## 2024-04-09 DIAGNOSIS — Z79899 Other long term (current) drug therapy: Secondary | ICD-10-CM | POA: Insufficient documentation

## 2024-04-09 DIAGNOSIS — E119 Type 2 diabetes mellitus without complications: Secondary | ICD-10-CM | POA: Insufficient documentation

## 2024-04-09 DIAGNOSIS — R1013 Epigastric pain: Secondary | ICD-10-CM | POA: Diagnosis present

## 2024-04-09 DIAGNOSIS — E876 Hypokalemia: Secondary | ICD-10-CM | POA: Diagnosis not present

## 2024-04-09 DIAGNOSIS — Z944 Liver transplant status: Secondary | ICD-10-CM | POA: Insufficient documentation

## 2024-04-09 DIAGNOSIS — E039 Hypothyroidism, unspecified: Secondary | ICD-10-CM | POA: Insufficient documentation

## 2024-04-09 LAB — URINALYSIS, ROUTINE W REFLEX MICROSCOPIC
Bilirubin Urine: NEGATIVE
Glucose, UA: NEGATIVE mg/dL
Hgb urine dipstick: NEGATIVE
Ketones, ur: NEGATIVE mg/dL
Nitrite: NEGATIVE
Protein, ur: NEGATIVE mg/dL
Specific Gravity, Urine: <1.005 — ABNORMAL LOW (ref 1.005–1.030)
pH: 8 (ref 5.0–8.0)

## 2024-04-09 LAB — COMPREHENSIVE METABOLIC PANEL WITH GFR
ALT: 35 U/L (ref 0–44)
AST: 26 U/L (ref 15–41)
Albumin: 3.7 g/dL (ref 3.5–5.0)
Alkaline Phosphatase: 97 U/L (ref 38–126)
Anion gap: 9 (ref 5–15)
BUN: 13 mg/dL (ref 6–20)
CO2: 31 mmol/L (ref 22–32)
Calcium: 9.4 mg/dL (ref 8.9–10.3)
Chloride: 100 mmol/L (ref 98–111)
Creatinine, Ser: 0.95 mg/dL (ref 0.44–1.00)
GFR, Estimated: >60 mL/min (ref >60)
Glucose, Bld: 86 mg/dL (ref 70–99)
Potassium: 3.2 mmol/L — ABNORMAL LOW (ref 3.5–5.1)
Sodium: 140 mmol/L (ref 135–145)
Total Bilirubin: 0.6 mg/dL (ref 0.0–1.2)
Total Protein: 6.3 g/dL — ABNORMAL LOW (ref 6.5–8.1)

## 2024-04-09 LAB — CBC WITH DIFFERENTIAL/PLATELET
Abs Immature Granulocytes: 0.02 K/uL (ref 0.00–0.07)
Basophils Absolute: 0 K/uL (ref 0.0–0.1)
Basophils Relative: 0 %
Eosinophils Absolute: 0.1 K/uL (ref 0.0–0.5)
Eosinophils Relative: 2 %
HCT: 35.4 % — ABNORMAL LOW (ref 36.0–46.0)
Hemoglobin: 12.1 g/dL (ref 12.0–15.0)
Immature Granulocytes: 0 %
Lymphocytes Relative: 18 %
Lymphs Abs: 1.4 K/uL (ref 0.7–4.0)
MCH: 32.5 pg (ref 26.0–34.0)
MCHC: 34.2 g/dL (ref 30.0–36.0)
MCV: 95.2 fL (ref 80.0–100.0)
Monocytes Absolute: 0.6 K/uL (ref 0.1–1.0)
Monocytes Relative: 8 %
Neutro Abs: 5.7 K/uL (ref 1.7–7.7)
Neutrophils Relative %: 72 %
Platelets: 121 K/uL — ABNORMAL LOW (ref 150–400)
RBC: 3.72 MIL/uL — ABNORMAL LOW (ref 3.87–5.11)
RDW: 12.8 % (ref 11.5–15.5)
WBC: 7.9 K/uL (ref 4.0–10.5)
nRBC: 0 % (ref 0.0–0.2)

## 2024-04-09 LAB — HCG, SERUM, QUALITATIVE: Preg, Serum: NEGATIVE

## 2024-04-09 LAB — LIPASE, BLOOD: Lipase: 80 U/L — ABNORMAL HIGH (ref 11–51)

## 2024-04-09 MED ORDER — IOHEXOL 300 MG/ML  SOLN
100.0000 mL | Freq: Once | INTRAMUSCULAR | Status: AC | PRN
  Administered 2024-04-09: 100 mL via INTRAVENOUS

## 2024-04-09 MED ORDER — MORPHINE SULFATE (PF) 4 MG/ML IV SOLN
4.0000 mg | Freq: Once | INTRAVENOUS | Status: AC
  Administered 2024-04-09: 4 mg via INTRAVENOUS
  Filled 2024-04-09: qty 1

## 2024-04-09 MED ORDER — CEPHALEXIN 500 MG PO CAPS: 500.0000 mg | ORAL_CAPSULE | Freq: Four times a day (QID) | ORAL | 0 refills | Status: AC

## 2024-04-09 NOTE — ED Triage Notes (Signed)
 Patient states j-tube discomfort. States distention around side. J-tube placed last Tuesday (11/11). States when she flushes it burns.

## 2024-04-09 NOTE — Discharge Instructions (Addendum)
 You were evaluated in the emergency room for abdominal pain.  Your lab work and imaging did not show any significant abnormality.  Your urine was equivocal for UTI.  A prescription for Keflex , antibiotic was sent into your pharmacy.  Please be sure to complete the full course of antibiotics and follow-up with your primary care doctor.  I would recommend contacting your GI doctor again for closer evaluation.  If you experience any new or worsening symptoms please return to the emergency room.

## 2024-04-09 NOTE — ED Provider Notes (Signed)
 [facility] AT [facility] Provider Note   CSN: [id number] Arrival date & time: 04/09/24  9083     Patient presents with: Abdominal Pain   Patricia Lutz is a 38 y.o. female history of liver transplant 2021 secondary to alcoholic cirrhosis, gastroparesis, urinary retention with chronic suprapubic catheter, malnutrition status post GJ tube 11/11 at [facility] presents with abdominal pain.  Symptoms started last night.  States that when she flushed her tube she had a burning sensation.  Started feeling that her abdomen was distended. States   she is in agony now constantly.  Without any nausea, vomiting or diarrhea.  Has not had a postop visit following procedure.    Abdominal Pain     Past Medical History:  Diagnosis Date   Anxiety    Cirrhosis (HCC)    Depression    Diabetes mellitus without complication (HCC)    GERD (gastroesophageal reflux disease)    Hashimoto's disease    Hepatitis    History of stomach ulcers    Hypothyroidism    Substance abuse (HCC)    alcohol   Past Surgical History:  Procedure Laterality Date   CESAREAN SECTION     IR CATHETER TUBE CHANGE  10/29/2022   IR CATHETER TUBE CHANGE  11/28/2022   LIVER TRANSPLANT       Prior to Admission medications   Medication Sig Start Date End Date Taking? Authorizing Provider  cephALEXin  (KEFLEX ) 500 MG capsule Take 1 capsule (500 mg total) by mouth 4 (four) times daily. 04/09/24  Yes Donnajean Lynwood DEL, PA-C  Continuous Glucose Sensor (DEXCOM G7 SENSOR) MISC  04/05/24  Yes [provider]  lactulose , encephalopathy, (CHRONULAC ) 10 GM/15ML SOLN Take 45 mLs by mouth daily as needed. 04/01/24 03/27/25 Yes [provider]  morphine  (MS CONTIN ) 30 MG 12 hr tablet Take 30 mg by mouth every 8 (eight) hours. 11/08/23 05/01/24 Yes [provider]  XANAX  0.5 MG tablet Take 0.5 mg by mouth 2 (two) times daily as needed. 11/08/23  Yes [provider]  Calcium  Citrate-Vitamin D3 315-6.25 MG-MCG TABS Take 2 tablets by mouth in the morning and at bedtime. 09/28/19   [provider]  CRANBERRY PO Take 1 tablet by mouth every morning.    [provider]  cycloSPORINE  modified (NEORAL ) 25 MG capsule Take 50 mg by mouth in the morning and at bedtime. 12/16/20   [provider]  docusate sodium  (COLACE) 100 MG capsule Take 1 capsule (100 mg total) by mouth 2 (two) times daily. 10/07/23   Lue Elsie BROCKS, MD  famotidine  (PEPCID ) 40 MG tablet Take 40 mg by mouth at bedtime.    [provider]  ferrous sulfate 325 (65 FE) MG tablet Take 325 mg by mouth daily. 06/23/23   [provider]  furosemide  (LASIX ) 20 MG tablet Take 40 mg by mouth daily. Patient not taking: Reported on 10/04/2023    [provider]  lactulose  (CHRONULAC ) 10 GM/15ML solution Take 45 mLs (30 g total) by mouth 2 (two) times daily. 10/07/23   Lue Elsie BROCKS, MD  lansoprazole (PREVACID) 15 MG capsule Take 15 mg by mouth 2 (two) times daily before a meal.    [provider]  levothyroxine  (SYNTHROID ) 100 MCG tablet Take 100 mcg by mouth daily before breakfast.    [provider]  magnesium  oxide (MAG-OX) 400 MG tablet Take 2 tablets by mouth 2 (two) times daily. 11/01/20   [provider]  ondansetron  (ZOFRAN -ODT)  4 MG disintegrating tablet Take 1 tablet (4 mg total) by mouth every 8 (eight) hours as needed. Patient taking differently: Take 4 mg by mouth as needed for nausea or vomiting. 03/11/23   Silver Wonda LABOR, PA  ondansetron  (ZOFRAN -ODT) 4 MG disintegrating tablet Take 1 tablet (4 mg total) by mouth every 8 (eight) hours as needed for up to 20 doses for nausea or vomiting. 12/25/23   Cottie Donnice PARAS, MD  oxyCODONE  (OXY IR/ROXICODONE ) 5 MG immediate release tablet Take 1 tablet (5 mg total) by mouth every 6 (six) hours as needed for moderate pain (pain score 4-6). 10/07/23   Lue Elsie BROCKS, MD   polyethylene glycol powder (GLYCOLAX /MIRALAX ) 17 GM/SCOOP powder Take 17 grams by mouth 2 (two) times daily. 10/07/23   Lue Elsie BROCKS, MD  predniSONE  (DELTASONE ) 1 MG tablet Take by mouth. *4 tablets every morning and 3 tablets daily at 3 pm* 06/17/23   [provider]  promethazine  (PHENERGAN ) 50 MG suppository Place 1 suppository (50 mg total) rectally every 6 (six) hours as needed for nausea or vomiting. 10/07/23   Lue Elsie BROCKS, MD  sertraline  (ZOLOFT ) 100 MG tablet Take 100 mg by mouth daily. 06/05/23   [provider]  VEMLIDY  25 MG TABS Take 1 tablet by mouth daily. 11/30/20   [provider]    Allergies: Ibuprofen    Review of Systems  Gastrointestinal:  Positive for abdominal pain.    Updated Vital Signs BP 105/71   Pulse 65   Temp 97.9 F (36.6 C) (Oral)   Resp 10   SpO2 96%   Physical Exam Vitals and nursing note reviewed.  Constitutional:      General: She is not in acute distress.    Appearance: She is well-developed.  HENT:     Head: Normocephalic and atraumatic.  Eyes:     Conjunctiva/sclera: Conjunctivae normal.  Cardiovascular:     Rate and Rhythm: Normal rate and regular rhythm.     Heart sounds: No murmur heard. Pulmonary:     Effort: Pulmonary effort is normal. No respiratory distress.     Breath sounds: Normal breath sounds.  Abdominal:     Palpations: Abdomen is soft.     Tenderness: There is abdominal tenderness.     Comments: Generalized abdominal tenderness worse in the epigastric region, suprapubic and GJ tube in place without any surrounding erythema, warmth or drainage.  No peritoneal signs, abdomen soft and nondistended  Musculoskeletal:        General: No swelling.     Cervical back: Neck supple.  Skin:    General: Skin is warm and dry.     Capillary Refill: Capillary refill takes less than 2 seconds.  Neurological:     Mental Status: She is alert.  Psychiatric:        Mood and Affect: Mood normal.      (all labs ordered are listed, but only abnormal results are displayed) Labs Reviewed  CBC WITH DIFFERENTIAL/PLATELET - Abnormal; Notable for the following components:      Result Value   RBC 3.72 (*)    HCT 35.4 (*)    Platelets 121 (*)    All other components within normal limits  COMPREHENSIVE METABOLIC PANEL WITH GFR - Abnormal; Notable for the following components:   Potassium 3.2 (*)    Total Protein 6.3 (*)    All other components within normal limits  LIPASE, BLOOD - Abnormal; Notable for the following components:   Lipase 80 (*)  All other components within normal limits  URINALYSIS, ROUTINE W REFLEX MICROSCOPIC - Abnormal; Notable for the following components:   Color, Urine COLORLESS (*)    Specific Gravity, Urine <1.005 (*)    Leukocytes,Ua MODERATE (*)    Bacteria, UA RARE (*)    All other components within normal limits  URINE CULTURE  HCG, SERUM, QUALITATIVE    EKG: None  Radiology: CT ABDOMEN PELVIS W CONTRAST Result Date: 04/09/2024 EXAM: CT ABDOMEN AND PELVIS WITH CONTRAST 04/09/2024 12:50:54 PM TECHNIQUE: CT of the abdomen and pelvis was performed with the administration of 100 mL of iohexol  (OMNIPAQUE ) 300 MG/ML solution. Multiplanar reformatted images are provided for review. Automated exposure control, iterative reconstruction, and/or weight-based adjustment of the mA/kV was utilized to reduce the radiation dose to as low as reasonably achievable. COMPARISON: 12/25/2023 CLINICAL HISTORY: PO complication FINDINGS: LOWER CHEST: Normal heart size. Clear lung bases. LIVER: Status post liver transplant. High left hepatic lobe subcentimeter cyst. Patent portal veins. Left abdominal retroperitoneum portosystemic collaterals. GALLBLADDER AND BILE DUCTS: Cholecystectomy. Focal mid common duct dilatation up to 2.3 cm including on image 44 / 4 is unchanged. New or increased since 2022. SPLEEN: Normal in size and morphology. PANCREAS: Normal, without duct dilatation  or acute inflammation. ADRENAL GLANDS: No acute abnormality. KIDNEYS, URETERS AND BLADDER: No renal mass or hydronephrosis. Normal urinary bladder. Suprapubic bladder catheter again identified. Air within the bladder is likely secondary. GI AND BOWEL: Proximal gastric underdistention. The gastrojejunostomy tube has been placed in the interval. The balloon and the anterior most stomach extend minimally into the anterior abdominal wall. No surrounding fluid collection. The gastrojejunal tube terminates in the proximal jejunum. Normal small bowel caliber. Normal colon and terminal ileum. Colonic stool burden suggests constipation. Cecum extends into the upper pelvis. The appendix is normal, positioned in the left pelvis. Paraesophageal varices. PERITONEUM AND RETROPERITONEUM: No ascites. No free air. VASCULATURE: Aorta is normal in caliber. LYMPH NODES: No lymphadenopathy. REPRODUCTIVE ORGANS: Intrauterine device. No adnexal mass. BONES AND SOFT TISSUES: L4-L5 anterolisthesis. Mild left convex lumbar spine curvature. No acute osseous abnormality. No focal soft tissue abnormality. IMPRESSION: 1. Interval placement of a gastrojejunostomy tube. the intragastric balloon and the anterior wall of the stomach herniate minimally into the anterior abdominal wall. No bowel obstruction identified. 2. Status post hepatic transplant with portal venous hypertension. 3. Focal mid common duct dilatation at up to 2.3 cm. progressive since 2022. cannot exclude type 1 choledochal cyst. Electronically signed by: Rockey Kilts MD 04/09/2024 01:34 PM EST RP Workstation: [id number]     Procedures   Medications Ordered in the ED  morphine  (PF) 4 MG/ML injection 4 mg (4 mg Intravenous Given 04/09/24 1101)  iohexol  (OMNIPAQUE ) 300 MG/ML solution 100 mL (100 mLs Intravenous Contrast Given 04/09/24 1242)    Clinical Course as of 04/09/24 1437  Thu Apr 09, 2024  1009 Patient with significant comorbidities including status post liver  transplant, chronic suprapubic catheter and recent GJ tube placement on 11/11 presents with complaints of abdominal pain with burning sensation when she flexes her tube.  Does not associate with any nausea, vomiting or diarrhea.  Upon arrival she is hemodynamically stable.  Her catheter flushes.  On exam she has generalized abdominal tenderness worse to the epigastric region.  Her abdomen is soft and nondistended.  There is no surrounding tube erythema or warmth.  No drainage appreciated.  Will obtain routine labs and CT abdomen pelvis. [JT]  1050 CBC with Differential(!) No leukocytosis, hemoglobin stable [JT]  1123  Comprehensive metabolic panel(!) Mild hypokalemia 3.2 otherwise no significant abnormality [JT]  1123 Lipase, blood(!) Mildly elevated to 80 [JT]  1355 CT ABDOMEN PELVIS W CONTRAST Interval placement of GJ tube, intragastric balloon and the anterior wall of the stomach herniate minimally into the anterior abdominal wall.  No bowel obstruction [JT]  1428 Urinalysis, Routine w reflex microscopic -Urine, Suprapubic(!) UA with rare bacteria, 0-5 WBCs, moderate leukocytes with negative nitrites.  Obtained via suprapubic catheter [JT]  1428 Workup is overall reassuring.  UA is equivocal for UTI.  Patient reports that she has had frequent problems with UTIs in the past and is requesting treatment.  She has an indwelling suprapubic catheter.  Will send a urine culture and treat empirically.  Encouraged to follow-up with PCP and contact her GI doctor for close follow-up.  Strict return precautions provided.  Patient is understanding agreement plan. [JT]    Clinical Course User Index [JT] Donnajean Lynwood DEL, PA-C                                 Medical Decision Making Amount and/or Complexity of Data Reviewed Labs: ordered. Decision-making details documented in ED Course. Radiology: ordered. Decision-making details documented in ED Course.  Risk Prescription drug management.   This  patient presents to the ED with chief complaint(s) of abdominal pain.  The complaint involves an extensive differential diagnosis and also carries with it a high risk of complications and morbidity.   Pertinent past medical history as listed in HPI  The differential diagnosis includes  Postop complication, bowel obstruction, pancreatitis, UTI, peritonitis Additional history obtained: Records reviewed Care Everywhere/External Records  Disposition:   Patient will be discharged home. The patient has been appropriately medically screened and/or stabilized in the ED. I have low suspicion for any other emergent medical condition which would require further screening, evaluation or treatment in the ED or require inpatient management. At time of discharge the patient is hemodynamically stable and in no acute distress. I have discussed work-up results and diagnosis with patient and answered all questions. Patient is agreeable with discharge plan. We discussed strict return precautions for returning to the emergency department and they verbalized understanding.     Social Determinants of Health:   none  This note was dictated with voice recognition software.  Despite best efforts at proofreading, errors may have occurred which can change the documentation meaning.       Final diagnoses:  Epigastric pain    ED Discharge Orders          Ordered    cephALEXin  (KEFLEX ) 500 MG capsule  4 times daily        04/09/24 1435               Donnajean Lynwood DEL DEVONNA 04/09/24 1437    Mannie Pac T, DO 04/10/24 [phone]

## 2024-04-11 LAB — URINE CULTURE: Culture: >=100000 — AB

## 2024-04-12 ENCOUNTER — Telehealth (HOSPITAL_BASED_OUTPATIENT_CLINIC_OR_DEPARTMENT_OTHER): Payer: Self-pay | Admitting: *Deleted

## 2024-04-12 NOTE — Progress Notes (Signed)
 ED Antimicrobial Stewardship Positive Culture Follow Up   Patricia Lutz is an 38 y.o. female who presented to [facility] on @ADMITDT @ with a chief complaint of  Chief Complaint  Patient presents with   Abdominal Pain    Recent Results (from the past 720 hours)  Urine Culture     Status: Abnormal   Collection Time: 04/09/24 12:11 PM   Specimen: Urine, Clean Catch  Result Value Ref Range Status   Specimen Description   Final    URINE, CLEAN CATCH Performed at [facility], [address], [place], [place] [address]    Special Requests   Final    NONE Performed at [facility], [address], [place], [place] [address]    Culture >=100,000 COLONIES/mL KLEBSIELLA OXYTOCA (A)  Final   Report Status 04/11/2024 FINAL  Final   Organism ID, Bacteria KLEBSIELLA OXYTOCA (A)  Final      Susceptibility   Klebsiella oxytoca - MIC*    AMPICILLIN >=32 RESISTANT Resistant     CEFEPIME  <=0.12 SENSITIVE Sensitive     ERTAPENEM <=0.12 SENSITIVE Sensitive     CEFTRIAXONE <=0.25 SENSITIVE Sensitive     CIPROFLOXACIN  <=0.06 SENSITIVE Sensitive     GENTAMICIN <=1 SENSITIVE Sensitive     NITROFURANTOIN 32 SENSITIVE Sensitive     TRIMETH /SULFA  <=20 SENSITIVE Sensitive     AMPICILLIN/SULBACTAM 8 SENSITIVE Sensitive     PIP/TAZO Value in next row Sensitive      <=4 SENSITIVEThis is a modified FDA-approved test that has been validated and its performance characteristics determined by the reporting laboratory.  This laboratory is certified under the Clinical Laboratory Improvement Amendments CLIA as qualified to perform high complexity clinical laboratory testing.    MEROPENEM Value in next row Sensitive      <=4 SENSITIVEThis is a modified FDA-approved test that has been validated and its performance characteristics determined by the reporting laboratory.  This laboratory is certified under the Clinical Laboratory Improvement Amendments CLIA as qualified to perform  high complexity clinical laboratory testing.    * >=100,000 COLONIES/mL KLEBSIELLA OXYTOCA    [x]  Patient colonized with above. Call patient to see if urine symptoms have improved.  Yes improved >> continue keflex  as prescribed  Not improved: Stop Keflex  Start cefpodoxime 200 mg BID x 7 days  ED Provider: Bernardino Melvenia Dorn Job, PharmD, BCPS 04/12/2024 10:56 AM ED Clinical Pharmacist -  [phone]

## 2024-04-12 NOTE — Telephone Encounter (Signed)
 Post ED Visit - Positive Culture Follow-up: Unsuccessful Patient Follow-up  Culture assessed and recommendations reviewed by:  [x]  Dorn Buttner, Pharm.D. []  Venetia Gully, Pharm.D., BCPS AQ-ID []  Garrel Crews, Pharm.D., BCPS []  Almarie Lunger, Pharm.D., BCPS []  [place], [address].D., BCPS, AAHIVP []  Rosaline Bihari, Pharm.D., BCPS, AAHIVP []  Massie Rigg, PharmD []  Jodie Rower, PharmD, BCPS  Positive urine culture   Plan: Call pt to see if urinary symptoms have improved.   If improved- Finish Keflex  as prescrived If not improved- Stop Keflex . Start Cefpodoxime 200mg  twice daily x 7 days (qty 14. Refills 0) per Bernardino Fireman, MD  Unable to contact patient , letter will be sent to address on file  Albino Alan Novak 04/12/2024, 4:25 PM

## 2024-04-23 ENCOUNTER — Other Ambulatory Visit: Payer: Self-pay

## 2024-04-23 ENCOUNTER — Emergency Department (HOSPITAL_BASED_OUTPATIENT_CLINIC_OR_DEPARTMENT_OTHER)
Admission: EM | Admit: 2024-04-23 | Discharge: 2024-04-23 | Disposition: A | Discharge status: Home / Self Care | Attending: Emergency Medicine | Admitting: Emergency Medicine

## 2024-04-23 ENCOUNTER — Emergency Department (HOSPITAL_BASED_OUTPATIENT_CLINIC_OR_DEPARTMENT_OTHER)

## 2024-04-23 DIAGNOSIS — K9423 Gastrostomy malfunction: Secondary | ICD-10-CM | POA: Insufficient documentation

## 2024-04-23 DIAGNOSIS — K9420 Gastrostomy complication, unspecified: Secondary | ICD-10-CM

## 2024-04-23 DIAGNOSIS — L03311 Cellulitis of abdominal wall: Secondary | ICD-10-CM | POA: Diagnosis present

## 2024-04-23 LAB — CBC WITH DIFFERENTIAL/PLATELET
Abs Immature Granulocytes: 0.01 K/uL (ref 0.00–0.07)
Basophils Absolute: 0 K/uL (ref 0.0–0.1)
Basophils Relative: 0 %
Eosinophils Absolute: 0.1 K/uL (ref 0.0–0.5)
Eosinophils Relative: 2 %
HCT: 40.6 % (ref 36.0–46.0)
Hemoglobin: 14 g/dL (ref 12.0–15.0)
Immature Granulocytes: 0 %
Lymphocytes Relative: 23 %
Lymphs Abs: 1.7 K/uL (ref 0.7–4.0)
MCH: 32.6 pg (ref 26.0–34.0)
MCHC: 34.5 g/dL (ref 30.0–36.0)
MCV: 94.6 fL (ref 80.0–100.0)
Monocytes Absolute: 0.6 K/uL (ref 0.1–1.0)
Monocytes Relative: 8 %
Neutro Abs: 4.7 K/uL (ref 1.7–7.7)
Neutrophils Relative %: 67 %
Platelets: 108 K/uL — ABNORMAL LOW (ref 150–400)
RBC: 4.29 MIL/uL (ref 3.87–5.11)
RDW: 13.1 % (ref 11.5–15.5)
WBC: 7.2 K/uL (ref 4.0–10.5)
nRBC: 0 % (ref 0.0–0.2)

## 2024-04-23 LAB — COMPREHENSIVE METABOLIC PANEL WITH GFR
ALT: 34 U/L (ref 0–44)
AST: 43 U/L — ABNORMAL HIGH (ref 15–41)
Albumin: 4.1 g/dL (ref 3.5–5.0)
Alkaline Phosphatase: 111 U/L (ref 38–126)
Anion gap: 10 (ref 5–15)
BUN: 9 mg/dL (ref 6–20)
CO2: 29 mmol/L (ref 22–32)
Calcium: 9.8 mg/dL (ref 8.9–10.3)
Chloride: 100 mmol/L (ref 98–111)
Creatinine, Ser: 0.97 mg/dL (ref 0.44–1.00)
GFR, Estimated: >60 mL/min (ref >60)
Glucose, Bld: 83 mg/dL (ref 70–99)
Potassium: 3.8 mmol/L (ref 3.5–5.1)
Sodium: 139 mmol/L (ref 135–145)
Total Bilirubin: 1.3 mg/dL — ABNORMAL HIGH (ref 0.0–1.2)
Total Protein: 6.7 g/dL (ref 6.5–8.1)

## 2024-04-23 LAB — LIPASE, BLOOD: Lipase: 18 U/L (ref 11–51)

## 2024-04-23 LAB — PREGNANCY, URINE: Preg Test, Ur: NEGATIVE

## 2024-04-23 MED ORDER — MORPHINE SULFATE (PF) 4 MG/ML IV SOLN
4.0000 mg | Freq: Once | INTRAVENOUS | Status: AC
  Administered 2024-04-23: 4 mg via INTRAVENOUS
  Filled 2024-04-23: qty 1

## 2024-04-23 MED ORDER — DOXYCYCLINE HYCLATE 100 MG PO CAPS: 100.0000 mg | ORAL_CAPSULE | Freq: Two times a day (BID) | ORAL | 0 refills | Status: AC

## 2024-04-23 MED ORDER — ONDANSETRON HCL 4 MG/2ML IJ SOLN
4.0000 mg | Freq: Once | INTRAMUSCULAR | Status: AC
  Administered 2024-04-23: 4 mg via INTRAVENOUS
  Filled 2024-04-23: qty 2

## 2024-04-23 MED ORDER — IOHEXOL 300 MG/ML  SOLN
100.0000 mL | Freq: Once | INTRAMUSCULAR | Status: AC | PRN
  Administered 2024-04-23: 100 mL via INTRAVENOUS

## 2024-04-23 NOTE — ED Triage Notes (Signed)
 Reports J-tube discomfort x 2 days. Placed on 11/11. Hx of same problem. Seen here for same on 11/20.   Liver Transplant patient.

## 2024-04-23 NOTE — ED Notes (Signed)
 DC paperwork given and verbally understood.... Pt aware of no drinking/driving due to the meds given... Provider aware of the Pts V/S.

## 2024-04-23 NOTE — Discharge Instructions (Signed)
 You were seen for the pain around your GJ-tube in the emergency department.  It is likely from mild abdominal wall cellulitis  At home, please take the antibiotics (doxycycline ) that we have prescribed you.    Check your MyChart online for the results of any tests that had not resulted by the time you left the emergency department.   Follow-up with your primary doctor in 2-3 days regarding your visit.  Follow-up with the surgeons to place the J-tube soon as possible  Return immediately to the emergency department if you experience any of the following: Fevers, worsening pain, or any other concerning symptoms.    Thank you for visiting our Emergency Department. It was a pleasure taking care of you today.

## 2024-04-23 NOTE — ED Provider Notes (Signed)
 [place] EMERGENCY DEPARTMENT AT [facility] Provider Note   CSN: [id number] Arrival date & time: 04/23/24  1034     Patient presents with: J Tube Pain   Patricia Lutz is a 38 y.o. female.   38 year old female history of liver transplant in 2021 due to alcoholic cirrhosis, gastroparesis, chronic nausea status post GJ tube placement on 11/11 at [facility] who presents to the emergency department with J-tube discomfort for the past 2 days.  Was seen in the emergency department for this on 11/20 and had a CT scan that showed possible herniation into the abdominal wall of the stomach and gastric bubble.  Reports that after that her symptoms resolved.  She drank which she believes is too much water last night and started having severe pain near the port site.  Nausea but no vomiting.  Still having bowel movements and passing gas.  No fevers or chills.  Has a small amount of drainage from it externally but no redness otherwise       Prior to Admission medications   Medication Sig Start Date End Date Taking? Authorizing Provider  doxycycline  (VIBRAMYCIN ) 100 MG capsule Take 1 capsule (100 mg total) by mouth 2 (two) times daily for 7 days. 04/23/24 04/30/24 Yes Yolande Lamar BROCKS, MD  Calcium Citrate-Vitamin D3 315-6.25 MG-MCG TABS Take 2 tablets by mouth in the morning and at bedtime. 09/28/19   [provider]  cephALEXin  (KEFLEX ) 500 MG capsule Take 1 capsule (500 mg total) by mouth 4 (four) times daily. 04/09/24   Donnajean Lynwood DEL, PA-C  Continuous Glucose Sensor (DEXCOM G7 SENSOR) MISC  04/05/24   [provider]  CRANBERRY PO Take 1 tablet by mouth every morning.    [provider]  cycloSPORINE  modified (NEORAL ) 25 MG capsule Take 50 mg by mouth in the morning and at bedtime. 12/16/20   [provider]  docusate sodium  (COLACE) 100 MG capsule Take 1 capsule (100 mg total) by mouth 2 (two) times daily. 10/07/23   Lue Elsie BROCKS, MD  famotidine   (PEPCID ) 40 MG tablet Take 40 mg by mouth at bedtime.    [provider]  ferrous sulfate 325 (65 FE) MG tablet Take 325 mg by mouth daily. 06/23/23   [provider]  furosemide  (LASIX ) 20 MG tablet Take 40 mg by mouth daily. Patient not taking: Reported on 10/04/2023    [provider]  lactulose  (CHRONULAC ) 10 GM/15ML solution Take 45 mLs (30 g total) by mouth 2 (two) times daily. 10/07/23   Lue Elsie BROCKS, MD  lactulose , encephalopathy, (CHRONULAC ) 10 GM/15ML SOLN Take 45 mLs by mouth daily as needed. 04/01/24 03/27/25  [provider]  lansoprazole (PREVACID) 15 MG capsule Take 15 mg by mouth 2 (two) times daily before a meal.    [provider]  levothyroxine  (SYNTHROID ) 100 MCG tablet Take 100 mcg by mouth daily before breakfast.    [provider]  magnesium  oxide (MAG-OX) 400 MG tablet Take 2 tablets by mouth 2 (two) times daily. 11/01/20   [provider]  morphine  (MS CONTIN ) 30 MG 12 hr tablet Take 30 mg by mouth every 8 (eight) hours. 11/08/23 05/01/24  [provider]  ondansetron  (ZOFRAN -ODT) 4 MG disintegrating tablet Take 1 tablet (4 mg total) by mouth every 8 (eight) hours as needed. Patient taking differently: Take 4 mg by mouth as needed for nausea or vomiting. 03/11/23   Silver Wonda LABOR, PA  ondansetron  (ZOFRAN -ODT) 4 MG disintegrating tablet Take 1  tablet (4 mg total) by mouth every 8 (eight) hours as needed for up to 20 doses for nausea or vomiting. 12/25/23   Cottie Donnice PARAS, MD  oxyCODONE  (OXY IR/ROXICODONE ) 5 MG immediate release tablet Take 1 tablet (5 mg total) by mouth every 6 (six) hours as needed for moderate pain (pain score 4-6). 10/07/23   Lue Elsie BROCKS, MD  polyethylene glycol powder (GLYCOLAX /MIRALAX ) 17 GM/SCOOP powder Take 17 grams by mouth 2 (two) times daily. 10/07/23   Lue Elsie BROCKS, MD  predniSONE  (DELTASONE ) 1 MG tablet Take by mouth. *4 tablets every morning and 3 tablets  daily at 3 pm* 06/17/23   [provider]  promethazine  (PHENERGAN ) 50 MG suppository Place 1 suppository (50 mg total) rectally every 6 (six) hours as needed for nausea or vomiting. 10/07/23   Lue Elsie BROCKS, MD  sertraline  (ZOLOFT ) 100 MG tablet Take 100 mg by mouth daily. 06/05/23   [provider]  VEMLIDY  25 MG TABS Take 1 tablet by mouth daily. 11/30/20   [provider]  XANAX  0.5 MG tablet Take 0.5 mg by mouth 2 (two) times daily as needed. 11/08/23   [provider]    Allergies: Ibuprofen    Review of Systems  Updated Vital Signs BP 97/67 (BP Location: Right Arm)   Pulse 71   Temp 98.1 F (36.7 C)   Resp 18   SpO2 97%   Physical Exam Constitutional:      Appearance: Normal appearance.  HENT:     Head: Normocephalic and atraumatic.  Abdominal:     General: There is no distension.     Palpations: There is no mass.     Tenderness: There is abdominal tenderness (Minimal epigastric around G-tube placement site). There is no guarding.     Comments: GJ tube present.  No discharge.  No surrounding erythema.  Neurological:     Mental Status: She is alert.     (all labs ordered are listed, but only abnormal results are displayed) Labs Reviewed  COMPREHENSIVE METABOLIC PANEL WITH GFR - Abnormal; Notable for the following components:      Result Value   AST 43 (*)    Total Bilirubin 1.3 (*)    All other components within normal limits  CBC WITH DIFFERENTIAL/PLATELET - Abnormal; Notable for the following components:   Platelets 108 (*)    All other components within normal limits  LIPASE, BLOOD  PREGNANCY, URINE    EKG: None  Radiology: CT ABDOMEN PELVIS W CONTRAST Result Date: 04/23/2024 CLINICAL DATA:  Abdominal pain. Jejunostomy tube discomfort 2 days. Previous liver transplant. EXAM: CT ABDOMEN AND PELVIS WITH CONTRAST TECHNIQUE: Multidetector CT imaging of the abdomen and pelvis was performed using the standard protocol  following bolus administration of intravenous contrast. RADIATION DOSE REDUCTION: This exam was performed according to the departmental dose-optimization program which includes automated exposure control, adjustment of the mA and/or kV according to patient size and/or use of iterative reconstruction technique. CONTRAST:  OMNIPAQUE  IOHEXOL  300 MG/ML  SOLN COMPARISON:  04/09/2024 FINDINGS: Lower chest: Heart is normal size.  Visualized lung bases are clear. Hepatobiliary: Couple small liver cysts unchanged. Minimal prominence of the central intrahepatic ducts unchanged. Previous gallbladder not visualized. 2.3 cm well-defined rounded cystic structure which appears to be abutting the posterior wall of the common bile duct as this is unchanged. Pancreas: Normal. Spleen: Normal. Adrenals/Urinary Tract: Adrenal glands are normal. Kidneys are normal in size without hydronephrosis or nephrolithiasis. Ureters and bladder are unremarkable.  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 There is a suprapubic catheter within the bladder which is slightly decompressed. Stomach/Bowel: Percutaneous gastrojejunostomy tube in adequate position with tip at the tube of jejunum in the left mid abdomen. 1.7 x 2 cm soft tissue prominence immediately inferior and left lateral aspect of the gastrostomy tube insertion site post over the most medial aspect of the left rectus abdominus muscle as this may represent focal edema, infection or hemorrhagic debris. Small bowel is otherwise unremarkable. Cecum is over the midline of the appendix is normal. Somewhat redundant colon which is otherwise unremarkable. Vascular/Lymphatic: Abdominal aorta is normal in caliber. 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 Remaining vascular structures are unremarkable. No adenopathy. Reproductive: IUD in adequate position. Uterus and adnexa regions are otherwise unremarkable. Other: Small amount of free fluid  over the cul-de-sac likely physiologic. Musculoskeletal: Stable grade 1 anterolisthesis of L4 on L5. No focal acute abnormality. IMPRESSION: 1. Percutaneous gastrojejunostomy tube in adequate position with tip at the tube of jejunum in the left mid abdomen. 1.7 x 2 cm soft tissue prominence immediately inferior and left lateral aspect of the gastrostomy tube insertion site as this may represent focal edema, infection or hemorrhagic debris. Recommend clinical correlation. 2. Couple small liver cysts unchanged. 3. 2.3 cm well-defined rounded cystic structure which appears to be abutting the posterior wall of the common bile duct as this is unchanged and may be postsurgical in this liver transplant patient. 4. 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 5. Suprapubic catheter within the bladder which is slightly decompressed. 6. Stable grade 1 anterolisthesis of L4 on L5. Electronically Signed   By: Toribio Agreste M.D.   On: 04/23/2024 12:57     Procedures   Medications Ordered in the ED  morphine  (PF) 4 MG/ML injection 4 mg (4 mg Intravenous Given 04/23/24 1109)  ondansetron  (ZOFRAN ) injection 4 mg (4 mg Intravenous Given 04/23/24 1108)  iohexol  (OMNIPAQUE ) 300 MG/ML solution 100 mL (100 mLs Intravenous Contrast Given 04/23/24 1204)  morphine  (PF) 4 MG/ML injection 4 mg (4 mg Intravenous Given 04/23/24 1238)                                    Medical Decision Making Amount and/or Complexity of Data Reviewed Labs: ordered. Radiology: ordered.  Risk Prescription drug management.   Patricia Lutz is a 38 year old female history of liver transplant in 2021 due to alcoholic cirrhosis, gastroparesis, chronic nausea status post GJ tube placement on 11/11 at [facility] who presents to the emergency department with J-tube discomfort for the past 2 days.    Initial Ddx:  Postoperative complication, obstruction, deep space infection, abdominal wall infection  MDM/Course:  Patient presents  emergency department with pain near her GJ tube site.  Also has been having nausea.  No vomiting.  No fevers or chills.  Was seen in the emergency department for this recently and had a CT scan that did not show acute findings.  On exam no obvious infections externally.  GJ tube appears to be in the proper location.  She had blood work that shows a normal white blood cell count.  Underwent a CT scan that does show some stranding of the abdominal wall to the left side of the GJ tube that is concerning for either irritation or infection.  No drainable fluid collection.  With her worsening pain we will go ahead and  treat her for abdominal wall cellulitis in case it is infectious.  Upon re-evaluation pain and improved after being given morphine .  Will have her follow-up with her primary doctor and surgeon to place her GJ tube as an outpatient  This patient presents to the ED for concern of complaints listed in HPI, this involves an extensive number of treatment options, and is a complaint that carries with it a high risk of complications and morbidity. Disposition including potential need for admission considered.   Dispo: DC Home. Return precautions discussed including, but not limited to, those listed in the AVS. Allowed pt time to ask questions which were answered fully prior to dc.  Records reviewed Outpatient Clinic Notes The following labs were independently interpreted: Chemistry and show no acute abnormality I independently reviewed the following imaging with scope of interpretation limited to determining acute life threatening conditions related to emergency care: CT Abdomen/Pelvis and agree with the radiologist interpretation with the following exceptions: none I have reviewed the patients home medications and made adjustments as needed  Portions of this note were generated with Dragon dictation software. Dictation errors may occur despite best attempts at proofreading.      Final diagnoses:   Abdominal wall cellulitis  Complication of gastrostomy tube [facility])    ED Discharge Orders          Ordered    doxycycline  (VIBRAMYCIN ) 100 MG capsule  2 times daily        04/23/24 1341               Yolande Lamar BROCKS, MD 04/23/24 1346
